# Patient Record
Sex: Male | Born: 1980
Health system: Southern US, Community
[De-identification: ages and names within clinical notes are randomized; demographics above are authoritative.]

## PROBLEM LIST (undated history)

## (undated) DIAGNOSIS — L659 Nonscarring hair loss, unspecified: Secondary | ICD-10-CM

## (undated) DIAGNOSIS — M549 Dorsalgia, unspecified: Secondary | ICD-10-CM

## (undated) DIAGNOSIS — R55 Syncope and collapse: Secondary | ICD-10-CM

## (undated) DIAGNOSIS — F1320 Sedative, hypnotic or anxiolytic dependence, uncomplicated: Secondary | ICD-10-CM

## (undated) DIAGNOSIS — R011 Cardiac murmur, unspecified: Secondary | ICD-10-CM

## (undated) DIAGNOSIS — F1120 Opioid dependence, uncomplicated: Secondary | ICD-10-CM

## (undated) DIAGNOSIS — G43909 Migraine, unspecified, not intractable, without status migrainosus: Secondary | ICD-10-CM

## (undated) DIAGNOSIS — G4700 Insomnia, unspecified: Secondary | ICD-10-CM

## (undated) DIAGNOSIS — R012 Other cardiac sounds: Secondary | ICD-10-CM

## (undated) DIAGNOSIS — F419 Anxiety disorder, unspecified: Secondary | ICD-10-CM

## (undated) DIAGNOSIS — F411 Generalized anxiety disorder: Secondary | ICD-10-CM

## (undated) DIAGNOSIS — R198 Other specified symptoms and signs involving the digestive system and abdomen: Secondary | ICD-10-CM

## (undated) DIAGNOSIS — R5383 Other fatigue: Secondary | ICD-10-CM

## (undated) HISTORY — PX: WISDOM TOOTH EXTRACTION: SHX21

## (undated) HISTORY — DX: Insomnia, unspecified: G47.00

## (undated) HISTORY — DX: Anxiety disorder, unspecified: F41.9

## (undated) HISTORY — DX: Generalized anxiety disorder: F41.1

## (undated) HISTORY — DX: Other specified symptoms and signs involving the digestive system and abdomen: R19.8

## (undated) HISTORY — DX: Other cardiac sounds: R01.2

## (undated) HISTORY — DX: Cardiac murmur, unspecified: R01.1

## (undated) HISTORY — DX: Nonscarring hair loss, unspecified: L65.9

## (undated) HISTORY — DX: Syncope and collapse: R55

## (undated) HISTORY — DX: Other fatigue: R53.83

---

## 2005-02-15 ENCOUNTER — Emergency Department (HOSPITAL_COMMUNITY): Admission: EM | Admit: 2005-02-15 | Discharge: 2005-02-15 | Payer: Self-pay | Admitting: Emergency Medicine
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
## 2011-06-17 ENCOUNTER — Other Ambulatory Visit: Payer: Self-pay

## 2011-06-17 NOTE — Telephone Encounter (Signed)
Pt is requesting dr Merla Riches to refill his three medication. Pt states cant get into the walkin clinic to see him Pharmacy is [facility]

## 2011-06-19 ENCOUNTER — Telehealth: Payer: Self-pay | Admitting: Family Medicine

## 2011-06-19 NOTE — Telephone Encounter (Signed)
.  umfc The patient called again regarding his medication refill.  There is message from 06-17-11 in system.  Please call patient at [phone]

## 2011-06-19 NOTE — Telephone Encounter (Signed)
Patient would like to know if we can refill his prescriptions without an office visit.  Patient is not sure when he can return to clinic.  He will try to come in next Tuesday.   Will need to pull chart.

## 2011-06-24 ENCOUNTER — Ambulatory Visit: Payer: 59 | Admitting: Internal Medicine

## 2011-06-24 VITALS — BP 131/85 | HR 82 | Temp 98.6°F | Resp 16 | Ht 68.0 in | Wt 172.0 lb

## 2011-06-24 DIAGNOSIS — G4700 Insomnia, unspecified: Secondary | ICD-10-CM | POA: Insufficient documentation

## 2011-06-24 DIAGNOSIS — G43909 Migraine, unspecified, not intractable, without status migrainosus: Secondary | ICD-10-CM | POA: Insufficient documentation

## 2011-06-24 DIAGNOSIS — F411 Generalized anxiety disorder: Secondary | ICD-10-CM

## 2011-06-24 MED ORDER — BUTALBITAL-ASA-CAFFEINE 50-325-40 MG PO CAPS: ORAL_CAPSULE | ORAL | Status: DC

## 2011-06-24 MED ORDER — CLONAZEPAM 0.5 MG PO TABS: 0.5000 mg | ORAL_TABLET | Freq: Three times a day (TID) | ORAL | Status: DC | PRN

## 2011-06-24 MED ORDER — TRAZODONE HCL 150 MG PO TABS: ORAL_TABLET | ORAL | Status: DC

## 2011-06-24 MED ORDER — BUTALBITAL-APAP-CAFFEINE 50-325-40 MG PO TABS: ORAL_TABLET | ORAL | Status: DC

## 2011-06-24 NOTE — Progress Notes (Signed)
  Subjective:    Patient ID: Dalton Kelly, male    DOB: February 23, 1981, 31 y.o.   MRN: [id number]  HPIReturns for routine followup Patient Active Problem List  Diagnoses  . Generalized anxiety disorder  . Persistent headaches  . Insomnia  He has been stable on medications for the last 6 months His job at [employer]. Has become more stressful since they were taken over by [employer] His relationship has dealt with more stress as his partner is working 12-15 hour days He wants to discuss adding children to their relationship he did through a surrogate birth or through adoption and we discussed those issues as well as methods for proving that he is fertile      Review of Systems  Constitutional: Negative for activity change and appetite change.       Notable for an 8 pound weight loss by increased activity and dieting  Eyes: Negative for visual disturbance.  Respiratory: Negative.   Cardiovascular: Negative.   Gastrointestinal: Negative.   Genitourinary: Negative.   Psychiatric/Behavioral: Negative for suicidal ideas, hallucinations, behavioral problems, confusion, sleep disturbance, self-injury, dysphoric mood, decreased concentration and agitation. The patient is not nervous/anxious and is not hyperactive.        There is some sleep disturbance because his partner  Has night terrors/for the last 2 weeks they have slept in separate rooms       Objective:   Physical ExamVital signs stable HEENT clear Neurologic: Intact Mood intact/appropriate        Assessment & Plan:   Patient Active Problem List  Diagnoses  . Generalized anxiety disorder  . Persistent headaches  . Insomnia   Meds ordered this encounter  Medications  . DISCONTD: clonazePAM (KLONOPIN) 0.5 MG tablet    Sig: Take 0.5 mg by mouth 3 (three) times daily as needed.  Marland Kitchen DISCONTD: butalbital-acetaminophen-caffeine (FIORICET WITH CODEINE) 50-325-40-30 MG per capsule    Sig: Take 1 capsule by mouth  every 4 (four) hours as needed.  Marland Kitchen DISCONTD: traZODone (DESYREL) 50 MG tablet    Sig: Take 75 mg by mouth at bedtime.  . clonazePAM (KLONOPIN) 0.5 MG tablet    Sig: Take 1 tablet (0.5 mg total) by mouth 3 (three) times daily as needed.    Dispense:  270 tablet    Refill:  1  . DISCONTD: butalbital-aspirin-caffeine (FIORINAL) 50-325-40 MG per capsule    Sig: 1-2 for HA tid prn    Dispense:  90 capsule    Refill:  5  . traZODone (DESYREL) 150 MG tablet    Sig: 1/2 at bedtime    Dispense:  45 tablet    Refill:  5  . butalbital-acetaminophen-caffeine (FIORICET, ESGIC) 50-325-40 MG per tablet    Sig: 1-2 up to 3 times a day for headache    Dispense:  90 tablet    Refill:  5   Referred to urology for infertility evaluation As surrogate parenting may cause $100,000 for each attempt Recheck 6 months

## 2011-07-28 ENCOUNTER — Telehealth: Payer: Self-pay

## 2011-07-28 NOTE — Telephone Encounter (Signed)
Pt states he is home sick, no transportation today, cannot come in to be evaluated.  Would like Dr. Merla Riches or someone to call in medication for nausea.  [facility].  [phone]

## 2011-07-29 ENCOUNTER — Ambulatory Visit (INDEPENDENT_AMBULATORY_CARE_PROVIDER_SITE_OTHER): Payer: 59 | Admitting: Internal Medicine

## 2011-07-29 VITALS — BP 125/89 | HR 77 | Temp 99.3°F | Resp 16 | Ht 68.0 in | Wt 177.0 lb

## 2011-07-29 DIAGNOSIS — R110 Nausea: Secondary | ICD-10-CM

## 2011-07-29 DIAGNOSIS — R5383 Other fatigue: Secondary | ICD-10-CM

## 2011-07-29 DIAGNOSIS — R509 Fever, unspecified: Secondary | ICD-10-CM

## 2011-07-29 DIAGNOSIS — M791 Myalgia, unspecified site: Secondary | ICD-10-CM

## 2011-07-29 DIAGNOSIS — R8281 Pyuria: Secondary | ICD-10-CM

## 2011-07-29 LAB — POCT CBC
MCH, POC: 31.9 pg — AB (ref 27–31.2)
MCHC: 33.7 g/dL (ref 31.8–35.4)
MCV: 94.7 fL (ref 80–97)
MID (cbc): 0.7 (ref 0–0.9)
POC LYMPH PERCENT: 22.4 %L (ref 10–50)
Platelet Count, POC: 231 10*3/uL (ref 142–424)
RDW, POC: 13 %
WBC: 8.6 10*3/uL (ref 4.6–10.2)

## 2011-07-29 LAB — POCT URINALYSIS DIPSTICK
Leukocytes, UA: NEGATIVE
Protein, UA: 30
Urobilinogen, UA: 0.2

## 2011-07-29 LAB — POCT UA - MICROSCOPIC ONLY
Casts, Ur, LPF, POC: NEGATIVE
Crystals, Ur, HPF, POC: NEGATIVE

## 2011-07-29 LAB — TSH: TSH: 0.682 u[IU]/mL (ref 0.350–4.500)

## 2011-07-29 MED ORDER — PROMETHAZINE HCL 25 MG PO TABS: 25.0000 mg | ORAL_TABLET | Freq: Three times a day (TID) | ORAL | Status: DC | PRN

## 2011-07-29 MED ORDER — ONDANSETRON 4 MG PO TBDP
8.0000 mg | ORAL_TABLET | Freq: Once | ORAL | Status: AC
  Administered 2011-07-29: 8 mg via ORAL

## 2011-07-29 MED ORDER — TRAMADOL HCL 50 MG PO TABS: 50.0000 mg | ORAL_TABLET | Freq: Four times a day (QID) | ORAL | Status: AC | PRN

## 2011-07-29 NOTE — Telephone Encounter (Signed)
Patient here today for evaluation.

## 2011-07-29 NOTE — Progress Notes (Addendum)
  Subjective:    Patient ID: Dalton Kelly, male    DOB: May 09, 1980, 31 y.o.   MRN: [id number]  HPI3 day history of significant myalgia, with fever and headache. Minimal cough that is nonproductive. No sore throat. No sinus congestion. No otalgia. Has been too nauseated to eat for 48 hours but has not vomited. Has mild abdominal discomfort in the epigastric area. No dysuria or frequency except for increased frequency from intake of fluids. Has question of hesitation starting stream for a few weeks.    Review of SystemsHis mother has suggested concerned about his recent weight gain and seeming fatigue because her family has lots of hypothyroidism     Objective:   Physical ExamIn obvious discomfort HEENT is clear without adenopathy Lungs clear Heart regular without murmur Abdomen soft with quiet bowel sounds/is mildly tender in the epigastric area without rebound Skin is clear Is very tender in the large muscles symmetrically    Results for orders placed in visit on 07/29/11  POCT CBC      Component Value Range   WBC 8.6  4.6 - 10.2 (K/uL)   Lymph, poc 1.9  0.6 - 3.4    POC LYMPH PERCENT 22.4  10 - 50 (%L)   MID (cbc) 0.7  0 - 0.9    POC MID % 8.6  0 - 12 (%M)   POC Granulocyte 5.9  2 - 6.9    Granulocyte percent 69.0  37 - 80 (%G)   RBC 4.95  4.69 - 6.13 (M/uL)   Hemoglobin 15.8  14.1 - 18.1 (g/dL)   HCT, POC 40.9  81.1 - 53.7 (%)   MCV 94.7  80 - 97 (fL)   MCH, POC 31.9 (*) 27 - 31.2 (pg)   MCHC 33.7  31.8 - 35.4 (g/dL)   RDW, POC 91.4     Platelet Count, POC 231  142 - 424 (K/uL)   MPV 8.0  0 - 99.8 (fL)  POCT UA - MICROSCOPIC ONLY      Component Value Range   WBC, Ur, HPF, POC 2-4     RBC, urine, microscopic 0-1     Bacteria, U Microscopic small     Mucus, UA 1+     Epithelial cells, urine per micros 1-3     Crystals, Ur, HPF, POC neg     Casts, Ur, LPF, POC neg     Yeast, UA neg    POCT URINALYSIS DIPSTICK      Component Value Range   Color, UA yellow     Clarity, UA clear     Glucose, UA neg     Bilirubin, UA neg     Ketones, UA neg     Spec Grav, UA 1.015     Blood, UA neg     pH, UA 5.5     Protein, UA 30     Urobilinogen, UA 0.2     Nitrite, UA neg     Leukocytes, UA Negative         Assessment & Plan:  Problem #1 myalgia fever and nausea secondary to viral syndrome Phenergan 25 mg every 6-8 hours when necessary Push by mouth fluids Since ibuprofen causes GI upset for him, he can use tramadol 1-2 at bedtime for muscle aching and headache  Problem #2 mild pyuria Culture  Problem #3 recent weight gain/family history of thyroid disorders Check TSH and free T4

## 2011-08-01 ENCOUNTER — Telehealth: Payer: Self-pay

## 2011-08-01 ENCOUNTER — Encounter: Payer: Self-pay | Admitting: Internal Medicine

## 2011-08-01 NOTE — Telephone Encounter (Signed)
Pt is asking for tramflu [facility] [address]

## 2011-08-02 NOTE — Telephone Encounter (Signed)
Spoke with patient, he was requesting tamiflu to help with his illness.  Advised that Tamiflu would not help his symptoms this far into his illness.  Recommended symptomatic treatment with OTC meds, rest and hydration.  RTC if feeling worse/dehydrated.

## 2011-08-02 NOTE — Telephone Encounter (Signed)
LMOM to CB and clarify what med he is needing.Marland KitchenMarland Kitchen

## 2011-08-02 NOTE — Telephone Encounter (Signed)
Noted and agree. 

## 2011-11-01 ENCOUNTER — Telehealth: Payer: Self-pay

## 2011-11-01 DIAGNOSIS — R55 Syncope and collapse: Secondary | ICD-10-CM

## 2011-11-01 NOTE — Telephone Encounter (Signed)
Patient states that he passed out and has spoken with Dr Merla Riches about this before.  He would like to know if we could do a referral to a cardiologist to make sure everything is ok.   Best phone: [phone]

## 2011-11-01 NOTE — Telephone Encounter (Signed)
Referral placed.

## 2011-11-02 ENCOUNTER — Telehealth: Payer: Self-pay | Admitting: *Deleted

## 2011-11-02 DIAGNOSIS — R5383 Other fatigue: Secondary | ICD-10-CM

## 2011-11-02 NOTE — Telephone Encounter (Signed)
Pt notified that referral has been placed and that he will be contacted.

## 2011-11-02 NOTE — Telephone Encounter (Signed)
Please advise patient that referral authorized

## 2011-11-02 NOTE — Telephone Encounter (Signed)
Wants referral also to endocrinology because of low normal tsh and his current symptoms

## 2011-11-03 NOTE — Telephone Encounter (Signed)
Done, left message to advise.

## 2011-11-09 ENCOUNTER — Ambulatory Visit (INDEPENDENT_AMBULATORY_CARE_PROVIDER_SITE_OTHER): Payer: 59 | Admitting: Internal Medicine

## 2011-11-09 VITALS — BP 120/80 | HR 83 | Temp 98.7°F | Resp 18 | Ht 67.5 in | Wt 179.0 lb

## 2011-11-09 DIAGNOSIS — R5383 Other fatigue: Secondary | ICD-10-CM

## 2011-11-09 DIAGNOSIS — R55 Syncope and collapse: Secondary | ICD-10-CM

## 2011-11-09 DIAGNOSIS — R011 Cardiac murmur, unspecified: Secondary | ICD-10-CM

## 2011-11-09 DIAGNOSIS — R5381 Other malaise: Secondary | ICD-10-CM

## 2011-11-09 DIAGNOSIS — L659 Nonscarring hair loss, unspecified: Secondary | ICD-10-CM

## 2011-11-09 DIAGNOSIS — R002 Palpitations: Secondary | ICD-10-CM

## 2011-11-09 DIAGNOSIS — R635 Abnormal weight gain: Secondary | ICD-10-CM

## 2011-11-09 LAB — COMPREHENSIVE METABOLIC PANEL
AST: 14 U/L (ref 0–37)
Albumin: 4.8 g/dL (ref 3.5–5.2)
BUN: 10 mg/dL (ref 6–23)
Calcium: 9.6 mg/dL (ref 8.4–10.5)
Chloride: 104 mEq/L (ref 96–112)
Potassium: 4.7 mEq/L (ref 3.5–5.3)
Sodium: 139 mEq/L (ref 135–145)
Total Protein: 7 g/dL (ref 6.0–8.3)

## 2011-11-09 LAB — POCT CBC
HCT, POC: 46.2 % (ref 43.5–53.7)
MCHC: 31.8 g/dL (ref 31.8–35.4)
MCV: 98.6 fL — AB (ref 80–97)
MID (cbc): 0.5 (ref 0–0.9)
POC LYMPH PERCENT: 36.7 %L (ref 10–50)
Platelet Count, POC: 259 10*3/uL (ref 142–424)
RDW, POC: 12.8 %
WBC: 6.1 10*3/uL (ref 4.6–10.2)

## 2011-11-09 LAB — TSH: TSH: 1.967 u[IU]/mL (ref 0.350–4.500)

## 2011-11-09 NOTE — Progress Notes (Addendum)
Subjective:    Patient ID: Dalton Kelly, male    DOB: 24-Dec-1980, 31 y.o.   MRN: [id number]  HPI  Pt here concerned about his thyroid. TSH Was 0.685 in June and is worried because it's on the low end of normal. Has been very fatigued. His job is very stressful and is getting worse. Has been exercising and really trying to lose weight and is slowly gaining wt instead.  Feels like he is losing a lot of hair. Has been sick a lot this yr which is extremely unusual. Hasn't missed work for illnesses before this yr. Having more BM's. Now daily where as it used to be every 2-3 days. Has difficulty concentrating. Failing fuzzyheaded. Continues with trazodone for sleep and this works well, getting 8 hours sleep at night without snoring or observed apnea and without daytime hypersomnolence. Continues with Klonopin and helps control anxiety on a when necessary basis and this is helpful.Exercise habits good.  Was getting his haircut last sat and was sitting still and he fainted. Was out 2-3 min. Was not dehydrated. Drinks plenty of water. He called here and we placed a referral to endo (see phone messages), but endo wants a full thyroid panel and other BW.  Continues with markedly increased stress level secondary to work [employer]-Will be fully certified after completing hours through March of 2014.  Current outpatient prescriptions:butalbital-acetaminophen-caffeine (FIORICET, ESGIC) 50-325-40 MG per tablet, 1-2 up to 3 times a day for headache, Disp: 90 tablet, Rfl: 5;  clonazePAM (KLONOPIN) 0.5 MG tablet, Take 1 tablet (0.5 mg total) by mouth 3 (three) times daily as needed., Disp: 270 tablet, Rfl: 1;  traZODone (DESYREL) 150 MG tablet, 1/2 at bedtime, Disp: 45 tablet, Rfl: 5  Long-term monogamousSSP-no risk factors  Review of Systems Continues with frequent headaches which have been present since early teen years. He has had a number of evaluations numerous medications including attempts with migraine  prevention, but nothing has ever worked except for the use of his current medication Fioricet when he has headaches. These are at least once a week. No vision changes/none congestion/no TMJ Denies shortness of breath or dyspnea on exertion, chest pain, edema, palpitations Denies reflux, nausea, vomiting/bowel movements are regular and very from every few days 2 daily/no definite history of dietary intolerances No arthritis/no muscle problems Negative genito-urinary complaints Denies depression symptoms    Objective:   Physical Exam Vital signs stable No acute distress Pupils equal round react to light and accommodation/EOMs conjugate ENT clear No thyromegaly or lymphadenopathy Heart regular without murmur /Systolic click present that moves with respiration/no associated murmur Lungs clear Extremities no edema/4 peripheral pulsesIntact Neurological intact   Results for orders placed in visit on 11/09/11  POCT CBC      Component Value Range   WBC 6.1  4.6 - 10.2 K/uL   Lymph, poc 2.2  0.6 - 3.4   POC LYMPH PERCENT 36.7  10 - 50 %L   MID (cbc) 0.5  0 - 0.9   POC MID % 7.4  0 - 12 %M   POC Granulocyte 3.4  2 - 6.9   Granulocyte percent 55.9  37 - 80 %G   RBC 4.69  4.69 - 6.13 M/uL   Hemoglobin 14.7  14.1 - 18.1 g/dL   HCT, POC 40.9  81.1 - 53.7 %   MCV 98.6 (*) 80 - 97 fL   MCH, POC 31.3 (*) 27 - 31.2 pg   MCHC 31.8  31.8 - 35.4 g/dL  RDW, POC 12.8     Platelet Count, POC 259  142 - 424 K/uL   MPV 8.1  0 - 99.8 fL  POCT SEDIMENTATION RATE      Component Value Range   POCT SED RATE 2  0 - 22 mm/hr        Assessment & Plan:  Problem #1 fatigue Problem #2 hair loss Problem #3 abnormal weight gain Problem #4 generalized anxiety Problem #5 insomnia Problem #6 headache syndrome Problem #7 systolic click Problem #8 irregular bowel habits Problem #9 recent syncope episode that sounds vasovagal  Check labs for metabolic parameters to call with results=Including thyroid  adrenal and celiac Will need to consider some intervention because his symptoms are so marked/He is greatly distressed with his fatigue level and his increased sense of not being well He has overheard our staff remarked that everything is normal and he is upset with him talking about him  May call for refill of Fioricet Clonopin and trazodone if needed over the next 3-6 months  This for visit represented prolonged care at 45 minutes not including chart and discussion of CBC or review of current labs

## 2011-11-11 ENCOUNTER — Telehealth: Payer: Self-pay

## 2011-11-11 NOTE — Telephone Encounter (Signed)
PATIENT WOULD LIKE TO KNOW IF HIS LAB RESULTS ARE BACK.  [phone]

## 2011-11-12 ENCOUNTER — Encounter: Payer: Self-pay | Admitting: Internal Medicine

## 2011-11-12 LAB — DHEA

## 2011-11-13 ENCOUNTER — Telehealth: Payer: Self-pay

## 2011-11-13 NOTE — Telephone Encounter (Signed)
See notes under previous phone message. 

## 2011-11-13 NOTE — Telephone Encounter (Signed)
Called pt and LMOM that labs have been reviewed by Dr Merla Riches and he sent him a letter explaining results yesterday. Advised pt that if he would like, he can CB and we will be happy to go over the results w/him over the phone.

## 2011-11-13 NOTE — Telephone Encounter (Signed)
Pt CB and d/w him the letter/labs that Dr Merla Riches sent. Pt is in agreement w/cardiologist referral and stated that it has already been set up and he just needs to call them to reschedule for a time he can go. Also, he stated that the endocrinologist needs the results of the labs bf an appt can be made, but they need to show that thyroid levels are abnormal in order to see pt, which the results do not support.

## 2011-11-13 NOTE — Telephone Encounter (Signed)
Pt returning [place] phone, pt states he will be available between now and 12pm for a return call. [phone]

## 2011-11-28 ENCOUNTER — Institutional Professional Consult (permissible substitution): Payer: Self-pay | Admitting: Cardiology

## 2011-12-11 ENCOUNTER — Telehealth: Payer: Self-pay

## 2011-12-11 NOTE — Telephone Encounter (Signed)
°  Pt is requesting six month supply of clonazePAM (KLONOPIN) 0.5 MG tablet butalbital-acetaminophen-caffeine (FIORICET, ESGIC) 50-325-40 MG per tablet [facility]   CBN:  [phone]

## 2011-12-12 ENCOUNTER — Telehealth: Payer: Self-pay

## 2011-12-12 MED ORDER — CLONAZEPAM 0.5 MG PO TABS: 0.5000 mg | ORAL_TABLET | Freq: Three times a day (TID) | ORAL | Status: DC | PRN

## 2011-12-12 NOTE — Telephone Encounter (Signed)
Pt states that the rx clonazepam  that was faxed should have been sent over for 6 month supply, also pt states that he needed a refill on floricet. [phone]

## 2011-12-12 NOTE — Telephone Encounter (Signed)
See notes under 10/17 phone message.

## 2011-12-12 NOTE — Telephone Encounter (Signed)
Pt CB asking why his clonazepam Rx has not been sent in yet. April explained to pt that it has to be approved by Dr Merla Riches. Can someone else review this RF req since Dr Merla Riches will not be back in office until Monday? 11/09/11 OV notes state we may RF clonazepam for 3-6 mos.

## 2011-12-12 NOTE — Telephone Encounter (Signed)
Spoke w/pt and explained that we have written a one month supply of clonazepam and will fax it to the pharmacy since Dr Merla Riches will not be in office until Monday. Clonazepam RF faxed to pharmacy w/confirmation.   Dr Merla Riches can you specify how many more RFs of clonazepam you approve for pt and also RF his Fioricet for the approved number of RFs (we have not sent in any Fioricet for pt yet).

## 2011-12-12 NOTE — Telephone Encounter (Signed)
Rx ready to be faxed.

## 2011-12-13 MED ORDER — CLONAZEPAM 0.5 MG PO TABS: 0.5000 mg | ORAL_TABLET | Freq: Three times a day (TID) | ORAL | Status: DC | PRN

## 2011-12-13 MED ORDER — BUTALBITAL-APAP-CAFFEINE 50-325-40 MG PO TABS: ORAL_TABLET | ORAL | Status: DC

## 2011-12-13 NOTE — Telephone Encounter (Signed)
See notes at end of last ov-ok to refill 3-6 mos Ok to fax or call printed rx Meds ordered this encounter  Medications  . DISCONTD: clonazePAM (KLONOPIN) 0.5 MG tablet    Sig: Take 1 tablet (0.5 mg total) by mouth 3 (three) times daily as needed.    Dispense:  90 tablet    Refill:  0  . clonazePAM (KLONOPIN) 0.5 MG tablet    Sig: Take 1 tablet (0.5 mg total) by mouth 3 (three) times daily as needed.    Dispense:  90 tablet    Refill:  5  . butalbital-acetaminophen-caffeine (FIORICET, ESGIC) 50-325-40 MG per tablet    Sig: 1-2 up to 3 times a day for headache    Dispense:  90 tablet    Refill:  5   Ok to do same when trazadone refill needed

## 2011-12-14 NOTE — Telephone Encounter (Signed)
Review of the chart shows that these were sent in on 12/13/11. Please review medications prior to sending to [facility] pool.

## 2011-12-14 NOTE — Telephone Encounter (Signed)
Spoke to patient and let him know the meds were called in.

## 2012-01-15 ENCOUNTER — Institutional Professional Consult (permissible substitution): Payer: Self-pay | Admitting: Cardiology

## 2012-04-06 ENCOUNTER — Telehealth: Payer: Self-pay

## 2012-04-06 NOTE — Telephone Encounter (Signed)
PT STATES HE HAVE A BACTERIAL VIRUS AND WOULD LIKE TO SPEAK WITH DR [name] ABOUT IT. PLEASE CALL [phone] HE DIDN'T WANT TO COME IN

## 2012-04-06 NOTE — Telephone Encounter (Signed)
I have advised him he can try Mucinex until he comes in.

## 2012-04-06 NOTE — Telephone Encounter (Signed)
I called patient he states he has had illness for 2 weeks. He has congestion, not sleeping and has cough. I have advised him to come in to the office.

## 2012-06-16 ENCOUNTER — Encounter: Payer: Self-pay | Admitting: Internal Medicine

## 2012-06-16 ENCOUNTER — Telehealth: Payer: Self-pay

## 2012-06-16 ENCOUNTER — Ambulatory Visit (INDEPENDENT_AMBULATORY_CARE_PROVIDER_SITE_OTHER): Payer: 59 | Admitting: Internal Medicine

## 2012-06-16 VITALS — BP 124/84 | HR 75 | Temp 98.1°F | Resp 18 | Ht 68.0 in | Wt 170.0 lb

## 2012-06-16 DIAGNOSIS — G4700 Insomnia, unspecified: Secondary | ICD-10-CM

## 2012-06-16 DIAGNOSIS — F411 Generalized anxiety disorder: Secondary | ICD-10-CM

## 2012-06-16 DIAGNOSIS — R51 Headache: Secondary | ICD-10-CM

## 2012-06-16 MED ORDER — TRAZODONE HCL 150 MG PO TABS: ORAL_TABLET | ORAL | Status: DC

## 2012-06-16 MED ORDER — ALPRAZOLAM 0.5 MG PO TABS: 0.5000 mg | ORAL_TABLET | Freq: Three times a day (TID) | ORAL | Status: DC | PRN

## 2012-06-16 MED ORDER — BUTALBITAL-APAP-CAFFEINE 50-325-40 MG PO TABS: ORAL_TABLET | ORAL | Status: DC

## 2012-06-16 NOTE — Telephone Encounter (Signed)
Patient is concerned about being taken off of klonopin without titrating down, please advise

## 2012-06-16 NOTE — Progress Notes (Signed)
  Subjective:    Patient ID: Dalton Kelly, male    DOB: May 12, 1980, 32 y.o.   MRN: [id number]  HPIf/u Patient Active Problem List  Diagnosis  . Generalized anxiety disorder--klonop .5 tid no longer enough on some days//anx work related/busyness of life  . Persistent headaches--decr freq and intensity and Esgic works well!!!  . Insomnia -1/2 x 150 traz works [employer] more=dry mouth and am sedation  . Systolic click     Evening anxiety and morning anx both bad No subs misuse/no cigs Stable relat  Review of Systems  Constitutional: Negative for activity change, fatigue and unexpected weight change.  Eyes: Negative for visual disturbance.  Cardiovascular: Negative for chest pain and palpitations.       Objective:   Physical Exam BP 124/84  Pulse 75  Temp(Src) 98.1 F (36.7 C)  Resp 18  Ht 5\' 8"  (1.727 m)  Wt 170 lb (77.111 kg)  BMI 25.85 kg/m2 heent cl Ht reg/pos click Neuro int Dry skin changes on lower extr Mood good /aff sta    Assessment & Plan:  Generalized anxiety disorder Insomnia  Persistent headaches Dyshidrosis legs  Meds ordered this encounter  Medications  . ALPRAZolam (XANAX) 0.5 MG tablet    Sig: Take 1 tablet (0.5 mg total) by mouth 3 (three) times daily as needed for sleep or anxiety.    Dispense:  90 tablet    Refill:  5  . traZODone (DESYREL) 150 MG tablet    Sig: 1/2 at bedtime    Dispense:  45 tablet    Refill:  3  . 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

## 2012-06-16 NOTE — Telephone Encounter (Signed)
Pt states that he saw Dr.[name] today and was completely taken off of Klonopin because of it no longer being effective, pt is now worried after reading online that he could possibly suffer from withdrawal from the klonopin since he had been taking the rx for 3 1/2 years. Please advise. Best# [phone]

## 2012-06-17 NOTE — Telephone Encounter (Signed)
Call/direct switch to xanax will prevent this

## 2012-06-17 NOTE — Telephone Encounter (Signed)
Called him to advise.  

## 2012-06-20 ENCOUNTER — Telehealth: Payer: Self-pay

## 2012-06-20 DIAGNOSIS — F411 Generalized anxiety disorder: Secondary | ICD-10-CM

## 2012-06-20 MED ORDER — CLONAZEPAM 0.5 MG PO TABS: ORAL_TABLET | ORAL | Status: DC

## 2012-06-20 NOTE — Telephone Encounter (Signed)
Would you like to change? Please advise 

## 2012-06-20 NOTE — Telephone Encounter (Signed)
Patient would like Dr Merla Riches to call him back.

## 2012-06-20 NOTE — Telephone Encounter (Signed)
Patient was in Wednesday and the Klonopin is not working as well as it has in the past so was switched to xanax and that is worse. Anxiety is up from 6.5 to a 9 and he is not sleeping. Would like to switch back to Klonopin. Best [phone]

## 2012-06-20 NOTE — Telephone Encounter (Signed)
Ok for  Meds ordered this encounter  Medications  . clonazePAM (KLONOPIN) 0.5 MG tablet    Sig: 1 am, 1 midday and 2 hs    Dispense:  120 tablet    Refill:  5

## 2012-06-21 NOTE — Telephone Encounter (Signed)
Klonopin Rx called into [facility]

## 2012-11-30 ENCOUNTER — Ambulatory Visit: Payer: 59 | Admitting: Internal Medicine

## 2012-11-30 VITALS — BP 122/80 | HR 77 | Temp 98.5°F | Resp 16 | Ht 67.5 in | Wt 177.6 lb

## 2012-11-30 DIAGNOSIS — R51 Headache: Secondary | ICD-10-CM

## 2012-11-30 DIAGNOSIS — F411 Generalized anxiety disorder: Secondary | ICD-10-CM

## 2012-11-30 DIAGNOSIS — G4700 Insomnia, unspecified: Secondary | ICD-10-CM

## 2012-11-30 MED ORDER — BUTALBITAL-APAP-CAFFEINE 50-325-40 MG PO TABS: ORAL_TABLET | ORAL | Status: DC

## 2012-11-30 MED ORDER — CLONAZEPAM 0.5 MG PO TABS: ORAL_TABLET | ORAL | Status: DC

## 2012-11-30 NOTE — Progress Notes (Signed)
  Subjective:    Patient ID: Dalton Kelly, male    DOB: [birth year], 32 y.o.   MRN: [id number]  HPI Has an appointment tomorrow, but had to cancel because he is returning back to work tomorrow. FMLA ends today.  Is in today to have klonopin and fioricet refilled.  No change in headaches. Fioricet works well if he takes 2 tabs, occasionally has to repeat in 4 hours.  Has noticed red, itchy bumps on distal knuckles for 1 month. Thinks this is related to new shaving gel that he started using one month ago. No bumps anywhere else.  Flu- declined.  Review of Systems     Objective:   Physical Exam  Nursing note and vitals reviewed. Constitutional: He is oriented to person, place, and time. He appears well-developed and well-nourished.  Neurological: He is alert and oriented to person, place, and time.  Skin: Skin is warm and dry.  Psychiatric: He has a normal mood and affect.   Bilateral dorsal DIPs with small amount swelling but no change in skin surface     Assessment & Plan:  GAD (generalized anxiety disorder) - Plan: clonazePAM (KLONOPIN) 0.5 MG tablet  Persistent headaches  Insomnia  Meds ordered this encounter  Medications  . clonazePAM (KLONOPIN) 0.5 MG tablet    Sig: 1 am, 1 midday and 2 hs    Dispense:  120 tablet    Refill:  5  . butalbital-acetaminophen-caffeine (FIORICET, ESGIC) 50-325-40 MG per tablet    Sig: 1-2 up to 3 times a day for headache    Dispense:  90 tablet    Refill:  5   Hand itching ?stress

## 2012-12-01 ENCOUNTER — Ambulatory Visit: Payer: 59 | Admitting: Internal Medicine

## 2012-12-15 ENCOUNTER — Ambulatory Visit: Payer: 59 | Admitting: Internal Medicine

## 2013-05-29 ENCOUNTER — Ambulatory Visit (INDEPENDENT_AMBULATORY_CARE_PROVIDER_SITE_OTHER): Payer: 59 | Admitting: Internal Medicine

## 2013-05-29 VITALS — BP 120/68 | HR 87 | Temp 97.8°F | Resp 16 | Ht 68.0 in | Wt 172.0 lb

## 2013-05-29 DIAGNOSIS — R51 Headache: Secondary | ICD-10-CM

## 2013-05-29 DIAGNOSIS — R519 Headache, unspecified: Secondary | ICD-10-CM

## 2013-05-29 DIAGNOSIS — F411 Generalized anxiety disorder: Secondary | ICD-10-CM

## 2013-05-29 MED ORDER — BUTALBITAL-APAP-CAFFEINE 50-325-40 MG PO TABS: ORAL_TABLET | ORAL | Status: DC

## 2013-05-29 MED ORDER — CLONAZEPAM 0.5 MG PO TABS: ORAL_TABLET | ORAL | Status: DC

## 2013-05-29 NOTE — Progress Notes (Addendum)
   Subjective:    Patient ID: Dalton Kelly, male    DOB: [birth year], 33 y.o.   MRN: [id number]  HPI Chief Complaint  Patient presents with  . Medication Refill    fioricet,klonopin, desyrel   This chart was scribed for Ellamae Siaobert Rohnan Bartleson, MD by Andrew Auaven Small, ED Scribe. This patient was seen in room 8 and the patient's care was started at 1:39 PM.  HPI Comments: Dalton Kelly is a 33 y.o. male who presents to the [facility] for a medication refill regarding fioricet, klonopin, desyrel. Pt reports that he is doing well. Pt reports that everything has been stable. Pt reports that he has about half desyrel left since the last visit due to refills. Pt reports that he is sleeping well and that work has been going well also. Pt reports that he has been on crutches for 3 weeks due to a right foot injury. Pt reports that he is in a boot for 6 weeks.   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 Fatigue   . Hair loss   . Generalized anxiety disorder   . Insomnia   . Systolic click   . Irregular bowel habits   . Episode of syncope     Recent that sounds vasovagal  . Anxiety    No Known Allergies Prior to Admission medications   Medication Sig Start Date End Date Taking? Authorizing Provider  butalbital-acetaminophen-caffeine (FIORICET, ESGIC) [phone] MG per tablet 1-2 up to 3 times a day for headache 11/30/12  Yes Tonye Pearsonobert P Craig Wisnewski, MD  clonazePAM [facility](KLONOPIN) 0.5 MG tablet 1 am, 1 midday and 2 hs 11/30/12  Yes Tonye Pearsonobert P Bethlehem Langstaff, MD  traZODone (DESYREL) 150 MG tablet 1/2 at bedtime 06/16/12  Yes Tonye Pearsonobert P Mearl Olver, MD   Review of Systems  Psychiatric/Behavioral: Negative for sleep disturbance.      Objective:   Physical Exam  Nursing note and vitals reviewed. Constitutional: He is oriented to person, place, and time. He appears well-developed and well-nourished. No distress.  HENT:  Head: Normocephalic and atraumatic.    Eyes: EOM are normal.  Neck: Neck supple. No tracheal deviation present.  Cardiovascular: Normal rate.   Pulmonary/Chest: Effort normal. No respiratory distress.  Musculoskeletal: Normal range of motion.  Neurological: He is alert and oriented to person, place, and time.  Skin: Skin is warm and dry.  Psychiatric: He has a normal mood and affect. His behavior is normal.      Assessment & Plan:   I have completed the patient encounter in its entirety as documented by the scribe, with editing by me where necessary. Aaran Enberg P. Merla Richesoolittle, M.D.   1. GAD (generalized anxiety disorder)   2. HA (headache)    Meds ordered this encounter  Medications  . clonazePAM (KLONOPIN) 0.5 MG tablet    Sig: 1 am, 1 midday and 2 hs    Dispense:  120 tablet    Refill:  5  . butalbital-acetaminophen-caffeine (FIORICET, ESGIC) 50-325-40 MG per tablet    Sig: 1-2 up to 3 times a day for headache    Dispense:  90 tablet    Refill:  5    May use mychat to commun re refills---next ov by appt 104 1 year [facility]

## 2013-06-07 ENCOUNTER — Encounter: Payer: Self-pay | Admitting: Internal Medicine

## 2013-06-07 DIAGNOSIS — F411 Generalized anxiety disorder: Secondary | ICD-10-CM

## 2013-06-08 ENCOUNTER — Encounter: Payer: Self-pay | Admitting: Internal Medicine

## 2013-06-08 MED ORDER — PROMETHAZINE HCL 25 MG PO TABS: 25.0000 mg | ORAL_TABLET | Freq: Four times a day (QID) | ORAL | Status: DC | PRN

## 2013-07-07 ENCOUNTER — Encounter: Payer: Self-pay | Admitting: Internal Medicine

## 2013-07-07 DIAGNOSIS — G4700 Insomnia, unspecified: Secondary | ICD-10-CM

## 2013-07-08 MED ORDER — TRAZODONE HCL 150 MG PO TABS: ORAL_TABLET | ORAL | Status: DC

## 2013-09-20 MED ORDER — PROMETHAZINE HCL 25 MG PO TABS
25.0000 mg | ORAL_TABLET | Freq: Four times a day (QID) | ORAL | Status: DC | PRN
Start: 2013-09-20 — End: 2014-07-04

## 2013-09-20 NOTE — Telephone Encounter (Signed)
Done

## 2013-10-25 ENCOUNTER — Encounter: Payer: Self-pay | Admitting: Internal Medicine

## 2013-10-25 DIAGNOSIS — F411 Generalized anxiety disorder: Secondary | ICD-10-CM

## 2013-10-25 DIAGNOSIS — G44049 Chronic paroxysmal hemicrania, not intractable: Secondary | ICD-10-CM

## 2013-10-26 MED ORDER — BUTALBITAL-APAP-CAFFEINE 50-325-40 MG PO TABS: ORAL_TABLET | ORAL | Status: DC

## 2013-10-26 MED ORDER — CLONAZEPAM 0.5 MG PO TABS: ORAL_TABLET | ORAL | Status: DC

## 2013-10-26 NOTE — Telephone Encounter (Signed)
Meds ordered this encounter  Medications  . butalbital-acetaminophen-caffeine (FIORICET, ESGIC) 50-325-40 MG per tablet    Sig: 1-2 up to 3 times a day for headache    Dispense:  90 tablet    Refill:  5  . clonazePAM (KLONOPIN) 0.5 MG tablet    Sig: 1 am, 1 midday and 2 hs    Dispense:  120 tablet    Refill:  5

## 2013-10-26 NOTE — Telephone Encounter (Signed)
Faxed Rx

## 2014-04-19 ENCOUNTER — Encounter: Payer: Self-pay | Admitting: Internal Medicine

## 2014-05-03 ENCOUNTER — Ambulatory Visit (INDEPENDENT_AMBULATORY_CARE_PROVIDER_SITE_OTHER): Payer: 59 | Admitting: Internal Medicine

## 2014-05-03 ENCOUNTER — Encounter: Payer: Self-pay | Admitting: Internal Medicine

## 2014-05-03 VITALS — BP 112/80 | HR 91 | Temp 97.8°F | Resp 16 | Ht 67.5 in | Wt 170.4 lb

## 2014-05-03 DIAGNOSIS — R51 Headache: Secondary | ICD-10-CM

## 2014-05-03 DIAGNOSIS — G4700 Insomnia, unspecified: Secondary | ICD-10-CM

## 2014-05-03 DIAGNOSIS — R519 Headache, unspecified: Secondary | ICD-10-CM

## 2014-05-03 DIAGNOSIS — F411 Generalized anxiety disorder: Secondary | ICD-10-CM | POA: Diagnosis not present

## 2014-05-03 MED ORDER — BUTALBITAL-APAP-CAFFEINE 50-325-40 MG PO TABS: ORAL_TABLET | ORAL | Status: DC

## 2014-05-03 MED ORDER — CLONAZEPAM 0.5 MG PO TABS: ORAL_TABLET | ORAL | Status: DC

## 2014-05-03 NOTE — Progress Notes (Signed)
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 No new neurological symptoms with headaches  Insomnia --Still does well with medication when he can't sleep and this is not getting in the way of his life   Health maintenance issues up-to-date  Review of systems--routine points negative except for present illness  Exam BP 112/80 mmHg  Pulse 91  Temp(Src) 97.8 F (36.6 C) (Oral)  Resp 16  Ht 5' 7.5" (1.715 m)  Wt 170 lb 6.4 oz (77.293 kg)  BMI 26.28 kg/m2  SpO2 100% HEENT clear Heart regular Cranial nerves intact Mood good Affect appropriate Judgment sound   Impression:  Generalized anxiety disorder  Persistent headaches  Insomnia  Meds ordered this encounter  Medications  . clonazePAM (KLONOPIN) 0.5 MG tablet    Sig: 1 am, 1 midday and 2 hs    Dispense:  120 tablet    Refill:  5  . butalbital-acetaminophen-caffeine (FIORICET, ESGIC) 50-325-40 MG per tablet    Sig: 1-2 up to 3 times a day for headache    Dispense:  90 tablet    Refill:  5   Follow-up by my chart

## 2014-06-07 ENCOUNTER — Telehealth: Payer: Self-pay

## 2014-06-07 ENCOUNTER — Encounter: Payer: Self-pay | Admitting: Internal Medicine

## 2014-06-07 NOTE — Telephone Encounter (Signed)
fyi Dr Merla Richesoolittle

## 2014-06-07 NOTE — Telephone Encounter (Signed)
Pt states he sent 2 messages in [employer] to Dr.Dolittle and want to make sure he reads them both before he responds to them both Please call [phone]

## 2014-06-08 ENCOUNTER — Other Ambulatory Visit: Payer: Self-pay | Admitting: Internal Medicine

## 2014-06-08 MED ORDER — BUTALBITAL-ASA-CAFF-CODEINE 50-325-40-30 MG PO CAPS: 1.0000 | ORAL_CAPSULE | Freq: Three times a day (TID) | ORAL | Status: DC | PRN

## 2014-06-08 NOTE — Telephone Encounter (Signed)
[facility] called-they have checked a # of other pharmacies and no one has the fiorinal with codeine, but they have the fioricet with codeine. Please advise if it's ok if they substitute.

## 2014-06-08 NOTE — Telephone Encounter (Signed)
Disregard message, dr Merla Richesdoolittle. Lanier Clamicole Bush helped me out with this. Thanks! The pharmacy didn't have the fiorinal with aspirin, they only had it with tylenol. So Joni Reiningicole approved it to be filled with the tylenol. Thanks!

## 2014-06-10 ENCOUNTER — Telehealth: Payer: Self-pay | Admitting: Internal Medicine

## 2014-06-10 MED ORDER — TOPIRAMATE 25 MG PO TABS: 25.0000 mg | ORAL_TABLET | Freq: Two times a day (BID) | ORAL | Status: DC

## 2014-06-10 NOTE — Telephone Encounter (Signed)
Added Topamax to decr freq of HAs

## 2014-06-10 NOTE — Telephone Encounter (Signed)
Patient is calling to check on the medication Topamax. Patient is requesting someone from clinical to give him a call about the medication. Patient stated he discussed with Dr. Merla Richesoolittle about trying Topamax. Please call patient at [phone]

## 2014-06-12 ENCOUNTER — Telehealth: Payer: Self-pay

## 2014-06-12 MED ORDER — TOPIRAMATE 25 MG PO TABS: 25.0000 mg | ORAL_TABLET | Freq: Every day | ORAL | Status: DC

## 2014-06-12 NOTE — Telephone Encounter (Signed)
Set in corrected Rx.

## 2014-06-12 NOTE — Telephone Encounter (Signed)
i only want qhs so #30

## 2014-06-12 NOTE — Telephone Encounter (Signed)
Pharm called to request a 30 day supply of Topamax, which would be #60 instead of #30. Dr Merla Richesoolittle since this is a new script, I wanted to make sure the quantity if what needs to be changed and not the directions. Pended for #60 to match sig.

## 2014-06-28 ENCOUNTER — Encounter: Payer: Self-pay | Admitting: Internal Medicine

## 2014-06-28 DIAGNOSIS — G4700 Insomnia, unspecified: Secondary | ICD-10-CM

## 2014-06-29 MED ORDER — TRAZODONE HCL 150 MG PO TABS: ORAL_TABLET | ORAL | Status: DC

## 2014-06-29 NOTE — Telephone Encounter (Signed)
Meds ordered this encounter  Medications  . traZODone (DESYREL) 150 MG tablet    Sig: 1/2 at bedtime    Dispense:  45 tablet    Refill:  3

## 2014-07-04 ENCOUNTER — Ambulatory Visit (INDEPENDENT_AMBULATORY_CARE_PROVIDER_SITE_OTHER): Payer: 59 | Admitting: Internal Medicine

## 2014-07-04 VITALS — BP 126/68 | HR 73 | Temp 98.7°F | Resp 16 | Ht 67.5 in | Wt 170.5 lb

## 2014-07-04 DIAGNOSIS — R51 Headache: Secondary | ICD-10-CM

## 2014-07-04 DIAGNOSIS — R519 Headache, unspecified: Secondary | ICD-10-CM

## 2014-07-04 MED ORDER — BUTALBITAL-APAP-CAFF-COD 50-325-40-30 MG PO CAPS: 1.0000 | ORAL_CAPSULE | ORAL | Status: DC | PRN

## 2014-07-04 MED ORDER — NADOLOL 20 MG PO TABS: 20.0000 mg | ORAL_TABLET | Freq: Every day | ORAL | Status: DC

## 2014-07-04 MED ORDER — BUTALBITAL-APAP-CAFF-COD 50-300-40-30 MG PO CAPS: 1.0000 | ORAL_CAPSULE | Freq: Three times a day (TID) | ORAL | Status: DC | PRN

## 2014-07-04 NOTE — Progress Notes (Signed)
   Subjective:    Patient ID: Dalton Kelly, male    DOB: Nov 06, 1980, 34 y.o.   MRN: [id number] This chart was scribed for Ellamae Siaobert Karinna Beadles, MD by Jolene Provostobert Halas, Medical Scribe. This patient was seen in Room 3 and the patient's care was started a 7:27 PM.  Chief Complaint  Patient presents with  . Follow-up    headache    HPI HPI Comments: Dalton Kelly is a 34 y.o. male [facility] for medications for his HA. Pt states he cannot take topomax because it makes his hair fall out He has done well since he started Fioricet with codeine. His mother has taken Fiorecet with Codeine for similar HA sx for many years, and it has worked well for her. She has also been treated with corgard which reduces her HA frequency, and he would like to take that as well.  No changes in his other problems and medications  Review of Systems  Constitutional: Negative for fever and chills.  Neurological: Positive for headaches.       Objective:   Physical Exam  Constitutional: He is oriented to person, place, and time. He appears well-developed and well-nourished. No distress.  HENT:  Head: Normocephalic and atraumatic.  Eyes: Pupils are equal, round, and reactive to light.  Neck: Neck supple.  Cardiovascular: Normal rate.   Pulmonary/Chest: Effort normal. No respiratory distress.  Musculoskeletal: Normal range of motion.  Neurological: He is alert and oriented to person, place, and time. Coordination normal.  Skin: Skin is warm and dry. He is not diaphoretic.  Psychiatric: He has a normal mood and affect. His behavior is normal.  Nursing note and vitals reviewed. BP 126/68 mmHg  Pulse 73  Temp(Src) 98.7 F (37.1 C) (Oral)  Resp 16  Ht 5' 7.5" (1.715 m)  Wt 170 lb 8 oz (77.338 kg)  BMI 26.29 kg/m2  SpO2 98%      Assessment & Plan:  Persistent headaches  Meds ordered this encounter  Medications  . nadolol (CORGARD) 20 MG tablet    Sig:  Take 1 tablet (20 mg total) by mouth daily.    Dispense:  30 tablet    Refill:  5  . butalbital-acetaminophen-caffeine (FIORICET WITH CODEINE) 50-325-40-30 MG per capsule    Sig: Take 1 capsule by mouth every 4 (four) hours as needed for headache.    Dispense:  90 capsule    Refill:  5   Follow up in the fall 2016  I have completed the patient encounter in its entirety as documented by the scribe, with editing by me where necessary. Zenya Hickam P. Merla Richesoolittle, M.D.

## 2014-07-05 ENCOUNTER — Telehealth: Payer: Self-pay

## 2014-07-05 NOTE — Telephone Encounter (Signed)
Answered pt's questions.

## 2014-07-05 NOTE — Telephone Encounter (Signed)
Patient is calling to speak to Benny LennertSarah Weber in regards to the tylenol medication. Patient states that last time it was sent it was a discontinued version of the medication and the patient is confused. Please call! [phone]

## 2014-08-25 ENCOUNTER — Other Ambulatory Visit: Payer: Self-pay

## 2014-08-25 ENCOUNTER — Encounter: Payer: Self-pay | Admitting: Internal Medicine

## 2014-08-25 MED ORDER — NADOLOL 20 MG PO TABS: 20.0000 mg | ORAL_TABLET | Freq: Every day | ORAL | Status: DC

## 2014-08-29 ENCOUNTER — Telehealth: Payer: Self-pay

## 2014-08-29 ENCOUNTER — Encounter: Payer: Self-pay | Admitting: Internal Medicine

## 2014-08-29 NOTE — Telephone Encounter (Signed)
[name] - Pt is taking nadolol for his headaches.  He has 4 refills left and his insurance will only fill 90-day supply.  However, his pharmacy called last week and left message that he had a 90-day with only refill.  He sent you a message on my chart about it and you told him that no one from here had sent it.  Pharmacy says your name was on it however.  Anyway, he says despite this weird happening, he does need his refill now for a 90-day supply.  [phone]

## 2014-08-30 ENCOUNTER — Other Ambulatory Visit: Payer: Self-pay | Admitting: Internal Medicine

## 2014-08-30 MED ORDER — NADOLOL 20 MG PO TABS: 20.0000 mg | ORAL_TABLET | Freq: Every day | ORAL | Status: DC

## 2014-08-30 NOTE — Telephone Encounter (Signed)
Rx sent 

## 2014-10-18 ENCOUNTER — Encounter: Payer: Self-pay | Admitting: Internal Medicine

## 2014-10-18 DIAGNOSIS — F411 Generalized anxiety disorder: Secondary | ICD-10-CM

## 2014-10-20 ENCOUNTER — Telehealth: Payer: Self-pay

## 2014-10-20 MED ORDER — CLONAZEPAM 0.5 MG PO TABS: ORAL_TABLET | ORAL | Status: DC

## 2014-10-20 NOTE — Telephone Encounter (Signed)
Patient is calling because he sent an e mail for a medication refill and hasn't received a response. Patient would 1 refill for clonopin to last until his next appointment. Pharmacy is [facility] [address]

## 2014-10-21 NOTE — Telephone Encounter (Signed)
Rx called into the pharm.

## 2014-11-21 ENCOUNTER — Telehealth: Payer: Self-pay

## 2014-11-24 ENCOUNTER — Telehealth: Payer: Self-pay | Admitting: Family Medicine

## 2014-11-24 NOTE — Telephone Encounter (Signed)
lmom of patient new appt date with Merla Riches with is 12/22/14 at 11:30

## 2014-12-18 NOTE — Telephone Encounter (Signed)
No msg °

## 2014-12-20 ENCOUNTER — Ambulatory Visit: Payer: 59 | Admitting: Internal Medicine

## 2014-12-22 ENCOUNTER — Encounter: Payer: Self-pay | Admitting: Internal Medicine

## 2014-12-22 ENCOUNTER — Ambulatory Visit (INDEPENDENT_AMBULATORY_CARE_PROVIDER_SITE_OTHER): Payer: 59 | Admitting: Internal Medicine

## 2014-12-22 VITALS — BP 109/72 | HR 66 | Temp 97.9°F | Resp 16 | Ht 68.0 in | Wt 160.0 lb

## 2014-12-22 DIAGNOSIS — R51 Headache: Secondary | ICD-10-CM | POA: Diagnosis not present

## 2014-12-22 DIAGNOSIS — R519 Headache, unspecified: Secondary | ICD-10-CM

## 2014-12-22 DIAGNOSIS — F411 Generalized anxiety disorder: Secondary | ICD-10-CM

## 2014-12-22 MED ORDER — BUTALBITAL-APAP-CAFF-COD 50-325-40-30 MG PO CAPS: 1.0000 | ORAL_CAPSULE | ORAL | Status: DC | PRN

## 2014-12-22 MED ORDER — CLONAZEPAM 0.5 MG PO TABS: ORAL_TABLET | ORAL | Status: DC

## 2014-12-24 NOTE — Progress Notes (Signed)
F/u Patient Active Problem List   Diagnosis Date Noted  . 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 Generalized anxiety disorder---stable on meds 06/24/2011  . Persistent headaches--best 6 months he's had in several yrs FINALLY on current regimen 06/24/2011  . Insomnia---not a factor now 06/24/2011   [place] Exam-BP 109/72 mmHg  Pulse 66  Temp(Src) 97.9 F (36.6 C)  Resp 16  Ht 5\' 8"  (1.727 m)  Wt 160 lb (72.576 kg)  BMI 24.33 kg/m2 Stable otherwise  IMP GAD (generalized anxiety disorder) - Plan: clonazePAM (KLONOPIN) 0.5 MG tablet  Persistent headaches  Meds ordered this encounter  Medications  . clonazePAM (KLONOPIN) 0.5 MG tablet    Sig: 1 am, 1 midday and 2 hs    Dispense:  120 tablet    Refill:  5  . butalbital-acetaminophen-caffeine (FIORICET WITH CODEINE) 50-325-40-30 MG capsule    Sig: Take 1 capsule by mouth every 4 (four) hours as needed for headache.    Dispense:  90 capsule    Refill:  5  has traz to cont 150 hs  F/u 6mos

## 2015-01-21 ENCOUNTER — Ambulatory Visit (INDEPENDENT_AMBULATORY_CARE_PROVIDER_SITE_OTHER): Payer: 59 | Admitting: Internal Medicine

## 2015-01-21 VITALS — BP 100/68 | HR 57 | Temp 98.4°F | Resp 16 | Ht 68.5 in | Wt 161.0 lb

## 2015-01-21 DIAGNOSIS — R05 Cough: Secondary | ICD-10-CM

## 2015-01-21 DIAGNOSIS — J0100 Acute maxillary sinusitis, unspecified: Secondary | ICD-10-CM

## 2015-01-21 DIAGNOSIS — R059 Cough, unspecified: Secondary | ICD-10-CM

## 2015-01-21 MED ORDER — HYDROCODONE-HOMATROPINE 5-1.5 MG/5ML PO SYRP: 5.0000 mL | ORAL_SOLUTION | Freq: Four times a day (QID) | ORAL | Status: DC | PRN

## 2015-01-21 MED ORDER — AMOXICILLIN 875 MG PO TABS: 875.0000 mg | ORAL_TABLET | Freq: Two times a day (BID) | ORAL | Status: DC

## 2015-01-21 NOTE — Progress Notes (Signed)
Subjective:  This chart was scribed for Ellamae Siaobert Bryson Palen, MD by Andrew Auaven Small, ED Scribe. This patient was seen in room 12 and the patient's care was started at 12:23 PM.    Patient ID: Dalton Kelly, male    DOB: 06/26/1980, 34 y.o.   MRN: [id number]  HPI   Chief Complaint  Patient presents with  . Cough    x 1 week  . Nasal Congestion  . Generalized Body Aches   HPI Comments: Dalton Kelly is a 34 y.o. male who presents to the [facility] complaining of cough. Symptoms started 1 week ago with sinus congestion and body aches. Cough keeps him awake at night, having to sit up while sleeping. He has been taking OTC medication without relief to symptoms. Pt has sick contacts at home.  He denies trouble swallowing. Pt cannot tolerate ibuprofen,   Past Medical History  Diagnosis Date  . Fatigue   . Hair loss   . Generalized anxiety disorder   . Insomnia   . Systolic click   . Irregular bowel habits   . Episode of syncope     Recent that sounds vasovagal  . Anxiety    Allergies  Allergen Reactions  . Ibuprofen     Abdominal pain   Prior to Admission medications   Medication Sig Start Date End Date Taking? Authorizing Provider  butalbital-acetaminophen-caffeine (FIORICET WITH CODEINE) 50-325-40-30 MG capsule Take 1 capsule by mouth every 4 (four) hours as needed for headache. 12/22/14  Yes Tonye Pearsonobert P Nakaiya Beddow, MD  clonazePAM (KLONOPIN) 0.5 MG tablet 1 am, 1 midday and 2 hs 12/22/14  Yes Tonye Pearsonobert P Kinsleigh Ludolph, MD  nadolol (CORGARD) 20 MG tablet Take 1 tablet (20 mg total) by mouth daily. 08/30/14  Yes Tonye Pearsonobert P Jewels Langone, MD  traZODone (DESYREL) 150 MG tablet 1/2 at bedtime 06/29/14  Yes Tonye Pearsonobert P Mckyla Deckman, MD    Review of Systems  Constitutional: Negative for fever and chills.  HENT: Positive for congestion.   Respiratory: Positive for cough.   Musculoskeletal: Positive for myalgias.    Objective:   Physical Exam  Constitutional: He is oriented to person,  place, and time. He appears well-developed and well-nourished. No distress.  HENT:  Head: Normocephalic and atraumatic.  Right Ear: External ear normal.  Left Ear: External ear normal.  Mouth/Throat: Oropharynx is clear and moist. No oropharyngeal exudate.  Purulent drainage in nose Throat clear  Eyes: Conjunctivae and EOM are normal.  Neck: Neck supple.  Cardiovascular: Normal rate, regular rhythm and normal heart sounds.   Pulmonary/Chest: Effort normal and breath sounds normal. He has no wheezes. He has no rales.  Musculoskeletal: Normal range of motion.  Lymphadenopathy:    He has no cervical adenopathy.  Neurological: He is alert and oriented to person, place, and time.  Skin: Skin is warm and dry.  Psychiatric: He has a normal mood and affect. His behavior is normal.  Nursing note and vitals reviewed.   Filed Vitals:   01/21/15 1221  BP: 100/68  Pulse: 57  Temp: 98.4 F (36.9 C)  Resp: 16  Height: 5' 8.5" (1.74 m)  Weight: 161 lb (73.029 kg)  SpO2: 96%   Assessment & Plan:  Max sinusitis w/ cough  Meds ordered this encounter  Medications  . amoxicillin (AMOXIL) 875 MG tablet    Sig: Take 1 tablet (875 mg total) by mouth 2 (two) times daily.    Dispense:  20 tablet    Refill:  0  .  HYDROcodone-homatropine (HYCODAN) 5-1.5 MG/5ML syrup    Sig: Take 5 mLs by mouth every 6 (six) hours as needed.    Dispense:  120 mL    Refill:  0    By signing my name below, I, Raven Small, attest that this documentation has been prepared under the direction and in the presence of Ellamae Sia, MD.  Electronically Signed: Andrew Au, ED Scribe. 01/21/2015. 12:32 PM.  I have completed the patient encounter in its entirety as documented by the scribe, with editing by me where necessary. Navin Dogan P. Merla Riches, M.D.

## 2015-01-22 ENCOUNTER — Telehealth: Payer: Self-pay

## 2015-01-22 ENCOUNTER — Encounter: Payer: Self-pay | Admitting: Internal Medicine

## 2015-01-22 ENCOUNTER — Other Ambulatory Visit: Payer: Self-pay | Admitting: Family Medicine

## 2015-01-22 MED ORDER — HYDROCOD POLST-CPM POLST ER 10-8 MG/5ML PO SUER: 5.0000 mL | Freq: Two times a day (BID) | ORAL | Status: DC | PRN

## 2015-01-22 NOTE — Telephone Encounter (Signed)
Patient came in office to p/u RX for Tussionex. Dr. Merla Richesoolittle approved and sent my chart message to patient, see below.  i'll send this to our refill pool and have someone sign for you as I'm not in til Tuesday pm         Dr Alwyn RenHopper will sign rx

## 2015-01-22 NOTE — Telephone Encounter (Signed)
Error

## 2015-01-22 NOTE — Telephone Encounter (Signed)
Please have someone sign this for him to pick up today Pended tussionex

## 2015-01-22 NOTE — Telephone Encounter (Signed)
Patient was recently prescribed a cough syrup that isn't hasn't been working and wants to know if he can get a prescription for tussuinex [phone]

## 2015-01-22 NOTE — Telephone Encounter (Signed)
Signed RX for Dr. Merla Richesoolittle.

## 2015-06-03 ENCOUNTER — Ambulatory Visit (INDEPENDENT_AMBULATORY_CARE_PROVIDER_SITE_OTHER): Payer: 59 | Admitting: Internal Medicine

## 2015-06-03 VITALS — BP 112/70 | HR 78 | Temp 98.2°F | Resp 14 | Ht 68.75 in | Wt 171.0 lb

## 2015-06-03 DIAGNOSIS — F411 Generalized anxiety disorder: Secondary | ICD-10-CM | POA: Diagnosis not present

## 2015-06-03 DIAGNOSIS — G44049 Chronic paroxysmal hemicrania, not intractable: Secondary | ICD-10-CM | POA: Diagnosis not present

## 2015-06-03 DIAGNOSIS — G4700 Insomnia, unspecified: Secondary | ICD-10-CM | POA: Diagnosis not present

## 2015-06-03 MED ORDER — CLONAZEPAM 0.5 MG PO TABS: ORAL_TABLET | ORAL | Status: DC

## 2015-06-03 MED ORDER — TRAZODONE HCL 150 MG PO TABS: ORAL_TABLET | ORAL | Status: DC

## 2015-06-03 MED ORDER — BUTALBITAL-APAP-CAFF-COD 50-325-40-30 MG PO CAPS: 1.0000 | ORAL_CAPSULE | ORAL | Status: DC | PRN

## 2015-06-03 NOTE — Progress Notes (Signed)
Subjective:  By signing my name below, I, [name], attest that this documentation has been prepared under the direction and in the presence of Ellamae Siaobert Quay Simkin, MD. Electronically Signed: [name], Scribe. 06/03/2015 , 4:09 PM .  Patient was seen in Room 9 .   Patient ID: Dalton Kelly, male    DOB: 09-20-1980, 35 y.o.   MRN: [id number] Chief Complaint  Patient presents with  . Medication Refill    Fioricet, Klonopin, Trazodone,   . Follow-up    Headache  . Medication Problem    Discuss Nadolol   HPI Dalton Kelly is a 35 y.o. male who [facility] medication refill on his fioricet, klonopin, and trazodone.   Medication He states Nadolol giving him side effects of fatigue when he takes it. It hasn't helped him with the frequency of his headaches. Most of his headaches occur in the morning. He's tried taking flexeril at bedtime in the past.  At an impasse as to what to do next next. Beta blockers =fatigue--SE topamax.  Patient Active Problem List   Diagnosis Date Noted  . Generalized anxiety disorder 06/24/2011  . Persistent headaches 06/24/2011  . Insomnia 06/24/2011    Current outpatient prescriptions:  .  butalbital-acetaminophen-caffeine (FIORICET WITH CODEINE) 50-325-40-30 MG capsule, Take 1 capsule by mouth every 4 (four) hours as needed for headache., Disp: 90 capsule, Rfl: 5 .  clonazePAM (KLONOPIN) 0.5 MG tablet, 1 am, 1 midday and 2 hs, Disp: 120 tablet, Rfl: 5 .  traZODone (DESYREL) 150 MG tablet, 1/2 at bedtime, Disp: 45 tablet, Rfl: 3 .  HYDROcodone-homatropine (HYCODAN) 5-1.5 MG/5ML syrup, Take 5 mLs by mouth every 6 (six) hours as needed. (Patient not taking: Reported on 06/03/2015), Disp: 120 mL, Rfl: 0 .  nadolol (CORGARD) 20 MG tablet, Take 1 tablet (20 mg total) by mouth daily. (Patient not taking: Reported on 06/03/2015), Disp: 90 tablet, Rfl: 1   Stressful job as Veterinary surgeoncounselor at [employer] Does well w/ response to acute HA rx but not to  preventives Good SSP relationship [place]ikes his work [place]  Review of Systems  Constitutional: Positive for fatigue. Negative for fever, chills and diaphoresis.  Respiratory: Negative for cough.   Cardiovascular: Negative for chest pain.  Gastrointestinal: Negative for nausea, vomiting, abdominal pain and diarrhea.  Neurological: Positive for headaches. Negative for dizziness, weakness and light-headedness.      Objective:   Physical Exam  Constitutional: He is oriented to person, place, and time. He appears well-developed and well-nourished. No distress.  HENT:  Head: Normocephalic and atraumatic.  Eyes: Conjunctivae and EOM are normal. Pupils are equal, round, and reactive to light.  Neck: Normal range of motion. Neck supple.  Cardiovascular: Normal rate.   Pulmonary/Chest: Effort normal. No respiratory distress.  Musculoskeletal: Normal range of motion.  Neurological: He is alert and oriented to person, place, and time. He has normal reflexes. No cranial nerve deficit.  Skin: Skin is warm and dry.  Psychiatric: He has a normal mood and affect. His behavior is normal. Judgment and thought content normal.  Nursing note and vitals reviewed.  BP 112/70 mmHg  Pulse 78  Temp(Src) 98.2 F (36.8 C) (Oral)  Resp 14  Ht 5' 8.75" (1.746 m)  Wt 171 lb (77.565 kg)  BMI 25.44 kg/m2  SpO2 98%    Assessment & Plan:  I have completed the patient encounter in its entirety as documented by the scribe, with editing by me where necessary. Molly Maduroobert  P. Merla Riches, M.D. GAD (generalized anxiety disorder) - Plan: clonazePAM (KLONOPIN) 0.5 MG tablet  Insomnia - Plan: traZODone (DESYREL) 150 MG tablet  Chronic paroxysmal hemicrania   Meds ordered this encounter  Medications  . butalbital-acetaminophen-caffeine (FIORICET WITH CODEINE) 50-325-40-30 MG capsule    Sig: Take 1 capsule by mouth every 4 (four) hours as needed for headache.    Dispense:  90 capsule    Refill:  5    . clonazePAM (KLONOPIN) 0.5 MG tablet    Sig: 1 am, 1 midday and 2 hs    Dispense:  120 tablet    Refill:  5  . traZODone (DESYREL) 150 MG tablet    Sig: 1/2 at bedtime    Dispense:  45 tablet    Refill:  3   Will refer to [facility] for HA eval--Dr Oralia Rud F/u here PCP as advised for he and partner

## 2015-06-03 NOTE — Patient Instructions (Signed)
     IF you received an x-ray today, you will receive an invoice from [facility]. Please contact [facility] at [phone] with questions or concerns regarding your invoice.   IF you received labwork today, you will receive an invoice from [facility]. Please contact [facility] at [phone] with questions or concerns regarding your invoice.   Our billing staff will not be able to assist you with questions regarding bills from these companies.  You will be contacted with the lab results as soon as they are available. The fastest way to get your results is to activate your My Chart account. Instructions are located on the last page of this paperwork. If you have not heard from us regarding the results in 2 weeks, please contact this office.      

## 2015-06-13 ENCOUNTER — Ambulatory Visit: Payer: 59 | Admitting: Internal Medicine

## 2015-06-16 ENCOUNTER — Encounter: Payer: Self-pay | Admitting: Internal Medicine

## 2015-06-16 ENCOUNTER — Other Ambulatory Visit: Payer: Self-pay | Admitting: Internal Medicine

## 2015-06-18 MED ORDER — PROMETHAZINE HCL 25 MG PO TABS: 25.0000 mg | ORAL_TABLET | Freq: Three times a day (TID) | ORAL | Status: DC | PRN

## 2015-09-26 ENCOUNTER — Ambulatory Visit (INDEPENDENT_AMBULATORY_CARE_PROVIDER_SITE_OTHER): Payer: 59 | Admitting: Physician Assistant

## 2015-09-26 VITALS — BP 118/78 | HR 81 | Temp 98.2°F | Resp 18 | Ht 67.75 in | Wt 172.0 lb

## 2015-09-26 DIAGNOSIS — J069 Acute upper respiratory infection, unspecified: Secondary | ICD-10-CM | POA: Diagnosis not present

## 2015-09-26 DIAGNOSIS — R05 Cough: Secondary | ICD-10-CM

## 2015-09-26 DIAGNOSIS — R110 Nausea: Secondary | ICD-10-CM

## 2015-09-26 DIAGNOSIS — R059 Cough, unspecified: Secondary | ICD-10-CM

## 2015-09-26 DIAGNOSIS — R197 Diarrhea, unspecified: Secondary | ICD-10-CM | POA: Diagnosis not present

## 2015-09-26 DIAGNOSIS — B9789 Other viral agents as the cause of diseases classified elsewhere: Secondary | ICD-10-CM

## 2015-09-26 DIAGNOSIS — R42 Dizziness and giddiness: Secondary | ICD-10-CM | POA: Diagnosis not present

## 2015-09-26 DIAGNOSIS — G43109 Migraine with aura, not intractable, without status migrainosus: Secondary | ICD-10-CM

## 2015-09-26 MED ORDER — IPRATROPIUM BROMIDE 0.06 % NA SOLN: 2.0000 | Freq: Three times a day (TID) | NASAL | 0 refills | Status: DC

## 2015-09-26 MED ORDER — HYDROCOD POLST-CPM POLST ER 10-8 MG/5ML PO SUER: 5.0000 mL | Freq: Two times a day (BID) | ORAL | 0 refills | Status: DC | PRN

## 2015-09-26 MED ORDER — ONDANSETRON 4 MG PO TBDP: 4.0000 mg | ORAL_TABLET | Freq: Three times a day (TID) | ORAL | 0 refills | Status: DC | PRN

## 2015-09-26 NOTE — Progress Notes (Signed)
Dalton Kelly  MRN: [id number] DOB: 05/10/1980  Subjective:  Pt presents to clinic with cold symptoms for 6 days -but they seem to be getting worse - he has congestion with a cough that is keeping him up at night and a congested nose without much rhinorrhea.  He has used proair without any relief from his cough and he is not sleeping well.  Diarrhea 2-3x/day like water, no recent abx and no recent travel - he has had some additional nausea with vomiting.  OTC meds - Nauzene, sudafed, mucinex, imodium  Review of Systems  Constitutional: Positive for appetite change (not hungry) and fatigue. Negative for chills and fever.  HENT: Positive for congestion, postnasal drip, rhinorrhea and sore throat.   Respiratory: Positive for cough (non-productive). Negative for shortness of breath and wheezing.        No h/o asthma, nonsmoker  Gastrointestinal: Positive for diarrhea and nausea. Negative for vomiting.  Allergic/Immunologic: Negative for environmental allergies.    Patient Active Problem List   Diagnosis Date Noted  . 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 Generalized anxiety disorder 06/24/2011  . Migraine 06/24/2011  . Insomnia 06/24/2011    Current Outpatient Prescriptions on File Prior to Visit  Medication Sig Dispense Refill  . butalbital-acetaminophen-caffeine (FIORICET WITH CODEINE) 50-325-40-30 MG capsule Take 1 capsule by mouth every 4 (four) hours as needed for headache. 90 capsule 5  . clonazePAM (KLONOPIN) 0.5 MG tablet 1 am, 1 midday and 2 hs 120 tablet 5  . traZODone (DESYREL) 150 MG tablet 1/2 at bedtime 45 tablet 3  . promethazine (PHENERGAN) 25 MG tablet Take 1 tablet (25 mg total) by mouth every 8 (eight) hours as needed for nausea or vomiting. (Patient not taking: Reported on 09/26/2015) 20 tablet 5   No current facility-administered medications on file prior to visit.     Allergies  Allergen Reactions  . Ibuprofen     Abdominal pain    Pt patients social history  was reviewed and updated.  Objective:  BP 118/78 (BP Location: Left Arm, Patient Position: Sitting, Cuff Size: Normal)   Pulse 81   Temp 98.2 F (36.8 C) (Oral)   Resp 18   Ht 5' 7.75" (1.721 m)   Wt 172 lb (78 kg)   SpO2 98%   BMI 26.35 kg/m   Physical Exam  Constitutional: He is oriented to person, place, and time and well-developed, well-nourished, and in no distress.  HENT:  Head: Normocephalic and atraumatic.  Right Ear: External ear normal.  Left Ear: External ear normal.  Eyes: Conjunctivae are normal.  Neck: Normal range of motion.  Cardiovascular: Normal rate, regular rhythm and normal heart sounds.   No murmur heard. Pulmonary/Chest: Effort normal and breath sounds normal. He has no wheezes.  Abdominal: Soft. Bowel sounds are normal. There is no tenderness.  Neurological: He is alert and oriented to person, place, and time. Gait normal.  Skin: Skin is warm and dry.  Psychiatric: Mood, memory, affect and judgment normal.   Orthostatic VS for the past 24 hrs:  BP- Lying Pulse- Lying BP- Sitting Pulse- Sitting BP- Standing at 0 minutes Pulse- Standing at 0 minutes  09/26/15 1818 117/82 62 108/75 80 112/87 80    Assessment and Plan :  Dizziness - Plan: Orthostatic vital signs  Cough  Diarrhea, unspecified type  Migraine with aura and without status migrainosus, not intractable  Viral URI with cough - Plan: ipratropium (ATROVENT) 0.06 % nasal spray, chlorpheniramine-HYDROcodone (TUSSIONEX PENNKINETIC ER) 10-8 MG/5ML  SUER  Nausea without vomiting - Plan: ondansetron (ZOFRAN ODT) 4 MG disintegrating tablet   Symptomatic treatment - he is compensating with his hydration status but he will be more effective with hydration if he is eating food - I suspect that his diarrhea is related to his virus as well as decrease food intake.  He will work on eating small meals and continuing his fluid intake.  He will continue the mucinex at home and he will start the above  medications to help with his uri symptoms.  He agrees and understands the plan.  The patient would like to have a CPE in the future and he was instructed on the best way to go about that.  [name] PA-C  [facility] 09/27/2015 2:40 PM

## 2015-09-26 NOTE — Patient Instructions (Signed)
     IF you received an x-ray today, you will receive an invoice from [facility]. Please contact [facility] at [phone] with questions or concerns regarding your invoice.   IF you received labwork today, you will receive an invoice from [facility]. Please contact [facility] at [phone] with questions or concerns regarding your invoice.   Our billing staff will not be able to assist you with questions regarding bills from these companies.  You will be contacted with the lab results as soon as they are available. The fastest way to get your results is to activate your My Chart account. Instructions are located on the last page of this paperwork. If you have not heard from us regarding the results in 2 weeks, please contact this office.      

## 2015-10-12 ENCOUNTER — Encounter: Payer: Self-pay | Admitting: Physician Assistant

## 2015-10-15 MED ORDER — BUTALBITAL-APAP-CAFF-COD 50-325-40-30 MG PO CAPS: 1.0000 | ORAL_CAPSULE | ORAL | 0 refills | Status: DC | PRN

## 2015-10-15 NOTE — Telephone Encounter (Signed)
Called in and notified pt on MyChart. 

## 2015-10-15 NOTE — Telephone Encounter (Signed)
Done for the patient - please call in

## 2015-10-20 ENCOUNTER — Ambulatory Visit (INDEPENDENT_AMBULATORY_CARE_PROVIDER_SITE_OTHER): Payer: 59 | Admitting: Physician Assistant

## 2015-10-20 VITALS — BP 108/68 | HR 80 | Resp 17 | Ht 68.5 in | Wt 177.0 lb

## 2015-10-20 DIAGNOSIS — Z0000 Encounter for general adult medical examination without abnormal findings: Secondary | ICD-10-CM | POA: Diagnosis not present

## 2015-10-20 DIAGNOSIS — R5382 Chronic fatigue, unspecified: Secondary | ICD-10-CM | POA: Diagnosis not present

## 2015-10-20 DIAGNOSIS — Z23 Encounter for immunization: Secondary | ICD-10-CM | POA: Diagnosis not present

## 2015-10-20 DIAGNOSIS — Z114 Encounter for screening for human immunodeficiency virus [HIV]: Secondary | ICD-10-CM

## 2015-10-20 DIAGNOSIS — Z13228 Encounter for screening for other metabolic disorders: Secondary | ICD-10-CM

## 2015-10-20 DIAGNOSIS — Z1329 Encounter for screening for other suspected endocrine disorder: Secondary | ICD-10-CM

## 2015-10-20 DIAGNOSIS — Z130 Encounter for screening for diseases of the blood and blood-forming organs and certain disorders involving the immune mechanism: Secondary | ICD-10-CM | POA: Diagnosis not present

## 2015-10-20 DIAGNOSIS — Z13220 Encounter for screening for lipoid disorders: Secondary | ICD-10-CM

## 2015-10-20 DIAGNOSIS — F411 Generalized anxiety disorder: Secondary | ICD-10-CM | POA: Diagnosis not present

## 2015-10-20 LAB — CBC WITH DIFFERENTIAL/PLATELET
BASOS PCT: 1 %
Basophils Absolute: 75 cells/uL (ref 0–200)
EOS ABS: 525 {cells}/uL — AB (ref 15–500)
EOS PCT: 7 %
HCT: 43.9 % (ref 38.5–50.0)
Hemoglobin: 15.5 g/dL (ref 13.2–17.1)
Lymphocytes Relative: 39 %
Lymphs Abs: 2925 cells/uL (ref 850–3900)
MCH: 32.6 pg (ref 27.0–33.0)
MCHC: 35.3 g/dL (ref 32.0–36.0)
MCV: 92.4 fL (ref 80.0–100.0)
MONOS PCT: 7 %
MPV: 9.2 fL (ref 7.5–12.5)
Monocytes Absolute: 525 cells/uL (ref 200–950)
NEUTROS ABS: 3450 {cells}/uL (ref 1500–7800)
Neutrophils Relative %: 46 %
PLATELETS: 265 10*3/uL (ref 140–400)
RBC: 4.75 MIL/uL (ref 4.20–5.80)
RDW: 12.4 % (ref 11.0–15.0)
WBC: 7.5 10*3/uL (ref 3.8–10.8)

## 2015-10-20 LAB — COMPLETE METABOLIC PANEL WITH GFR
ALT: 28 U/L (ref 9–46)
AST: 24 U/L (ref 10–40)
Albumin: 4.7 g/dL (ref 3.6–5.1)
Alkaline Phosphatase: 68 U/L (ref 40–115)
BILIRUBIN TOTAL: 0.4 mg/dL (ref 0.2–1.2)
BUN: 11 mg/dL (ref 7–25)
CO2: 30 mmol/L (ref 20–31)
CREATININE: 0.88 mg/dL (ref 0.60–1.35)
Calcium: 9.1 mg/dL (ref 8.6–10.3)
Chloride: 98 mmol/L (ref 98–110)
GFR, Est African American: >89 mL/min (ref >=60)
GFR, Est Non African American: >89 mL/min (ref >=60)
GLUCOSE: 81 mg/dL (ref 65–99)
Potassium: 4.2 mmol/L (ref 3.5–5.3)
SODIUM: 136 mmol/L (ref 135–146)
TOTAL PROTEIN: 7 g/dL (ref 6.1–8.1)

## 2015-10-20 LAB — LIPID PANEL
Cholesterol: 197 mg/dL (ref 125–200)
HDL: 51 mg/dL (ref >=40)
LDL CALC: 114 mg/dL (ref <130)
Total CHOL/HDL Ratio: 3.9 Ratio (ref <=5.0)
Triglycerides: 159 mg/dL — ABNORMAL HIGH (ref <150)
VLDL: 32 mg/dL — ABNORMAL HIGH (ref <30)

## 2015-10-20 LAB — T4, FREE: FREE T4: 1.1 ng/dL (ref 0.8–1.8)

## 2015-10-20 LAB — TSH: TSH: 1.26 m[IU]/L (ref 0.40–4.50)

## 2015-10-20 LAB — HIV ANTIBODY (ROUTINE TESTING W REFLEX): HIV 1&2 Ab, 4th Generation: NONREACTIVE

## 2015-10-20 MED ORDER — ESCITALOPRAM OXALATE 10 MG PO TABS: 10.0000 mg | ORAL_TABLET | Freq: Every day | ORAL | 0 refills | Status: DC

## 2015-10-20 NOTE — Patient Instructions (Signed)
     IF you received an x-ray today, you will receive an invoice from [facility]. Please contact [facility] at [phone] with questions or concerns regarding your invoice.   IF you received labwork today, you will receive an invoice from [facility]. Please contact [facility] at [phone] with questions or concerns regarding your invoice.   Our billing staff will not be able to assist you with questions regarding bills from these companies.  You will be contacted with the lab results as soon as they are available. The fastest way to get your results is to activate your My Chart account. Instructions are located on the last page of this paperwork. If you have not heard from us regarding the results in 2 weeks, please contact this office.      

## 2015-10-20 NOTE — Progress Notes (Signed)
Dalton Kelly  MRN: [id number] DOB: [birth year]  Subjective:  Pt presents to clinic for a CPE.  He is always fatigued - he has a stressful job and does not sleep well due to work issues. He has known anxiety and has been treated for years with Klonopin tid and that helps - he does have breakthrough anxiety but no anxiety or panic attacks.  He takes the Klonopin scheduled and does not miss a dose.  He has a therapist who is sees regularly.  He has almost daily headaches and has tried triptans but they do not help so he uses Fioricet sucessfully.  Pt is worried he has an immune system problem because he gets sick all the time.  He has problems with feeling like he is going to pass out a lot and he drinks a lot of electrolyte beverages and rests.  He now knows what to do when he feels bad.  Last dental exam: every 6 months Last vision exam: never   Vaccinations      Tetanus - unsure  Patient Active Problem List   Diagnosis Date Noted  . 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 Generalized anxiety disorder 06/24/2011  . Migraine 06/24/2011  . Insomnia 06/24/2011    Current Outpatient Prescriptions on File Prior to Visit  Medication Sig Dispense Refill  . butalbital-acetaminophen-caffeine (FIORICET WITH CODEINE) 50-325-40-30 MG capsule Take 1 capsule by mouth every 4 (four) hours as needed for headache. 90 capsule 0  . clonazePAM (KLONOPIN) 0.5 MG tablet 1 am, 1 midday and 2 hs 120 tablet 5  . traZODone (DESYREL) 150 MG tablet 1/2 at bedtime 45 tablet 3   No current facility-administered medications on file prior to visit.     Allergies  Allergen Reactions  . Ibuprofen     Abdominal pain    Social History   Social History  . Marital status: Married    Spouse name: [name]  . Number of children: N/A  . Years of education: N/A   Occupational History  . therapist     [employer]   Social History Main Topics  . Smoking status: Former Games developer  . Smokeless tobacco: Never Used  . Alcohol  use No  . Drug use: No  . Sexual activity: Yes    Partners: Male   Other Topics Concern  . None   Social History Narrative   Mental Health therapist at [employer]    Married - [name]    Past Surgical History:  Procedure Laterality Date  . WISDOM TOOTH EXTRACTION      Family History  Problem Relation Age of Onset  . Cancer Mother   . Hypertension Father   . Stroke Maternal Grandmother   . Alzheimer's disease Maternal Grandmother   . Cancer Maternal Grandfather   . Diabetes Maternal Grandfather   . Cancer Paternal Grandmother   . Heart failure Paternal Grandfather     Review of Systems  Constitutional: Negative.   HENT: Negative.   Eyes: Negative.   Respiratory: Negative.   Cardiovascular: Negative.   Gastrointestinal: Negative.   Endocrine: Negative.   Genitourinary: Negative.   Musculoskeletal: Negative.   Skin: Negative.   Allergic/Immunologic: Negative.   Neurological: Negative.   Hematological: Negative.   Psychiatric/Behavioral: Negative.    Objective:  BP 108/68 (BP Location: Right Arm, Patient Position: Sitting, Cuff Size: Normal)   Pulse 80   Resp 17   Ht 5' 8.5" (1.74 m)   Wt 177 lb (80.3 kg)  SpO2 99%   BMI 26.52 kg/m   Physical Exam  Constitutional: He is oriented to person, place, and time and well-developed, well-nourished, and in no distress.  HENT:  Head: Normocephalic and atraumatic.  Right Ear: Hearing, tympanic membrane, external ear and ear canal normal.  Left Ear: Hearing, tympanic membrane, external ear and ear canal normal.  Nose: Nose normal.  Mouth/Throat: Uvula is midline, oropharynx is clear and moist and mucous membranes are normal.  Eyes: Conjunctivae and EOM are normal. Pupils are equal, round, and reactive to light.  Neck: Trachea normal and normal range of motion. Neck supple. No thyroid mass and no thyromegaly present.  Cardiovascular: Normal rate, regular rhythm and normal heart sounds.   No murmur  heard. Pulmonary/Chest: Effort normal and breath sounds normal.  Abdominal: Soft. Bowel sounds are normal.  Musculoskeletal: Normal range of motion.  Neurological: He is alert and oriented to person, place, and time. Gait normal.  Skin: Skin is warm and dry.  Psychiatric: Memory, affect and judgment normal. His mood appears anxious.  Pt is physically anxious in the room    Visual Acuity Screening   Right eye Left eye Both eyes  Without correction: 20/15 20/15 20/15   With correction:      Spent > 1h with patient with >50% spent in counseling and reviewing his history Assessment and Plan :  Annual physical exam - anticipatory guidance given to patient  Need for Tdap vaccination - Plan: Tdap vaccine greater than or equal to 7yo IM  Screening for deficiency anemia - Plan: CBC with Differential/Platelet  Screening for metabolic disorder - Plan: COMPLETE METABOLIC PANEL WITH GFR  Screening cholesterol level - Plan: Lipid panel  Screening for thyroid disorder - Plan: TSH   Encounter for screening for HIV - Plan: HIV antibody  Chronic fatigue - Plan: T4, Free - check labs though expect related to GAD and disrupted sleeo  GAD (generalized anxiety disorder) - Plan: escitalopram (LEXAPRO) 10 MG tablet - not controlled on Klonopin - we discussed at length that Klonopin is not a long term treatment for anxiety and that it is more like a band-aid rather than a true treatment - he does not misuse the klonopin but he does need a daily treatment - after a lot of convincing pt reluctantly agrees to try this medication - he is wondering why we have to change what is working but recognizes the problem and the fact that he is still anxious - he is very worried about sexual side effects  Headaches - he will continue his treatment -- but suggested he think about trial of PT to help with daily headache - I hope that start of SSRI will also help to better control his anxiety which in turn will help with his  headaches   Benny LennertSarah Caria Transue PA-C  [facility] 10/20/2015 1:48 PM

## 2015-10-26 ENCOUNTER — Encounter: Payer: Self-pay | Admitting: Physician Assistant

## 2015-10-26 DIAGNOSIS — F411 Generalized anxiety disorder: Secondary | ICD-10-CM

## 2015-10-26 MED ORDER — DULOXETINE HCL 20 MG PO CPEP: 20.0000 mg | ORAL_CAPSULE | Freq: Every day | ORAL | 0 refills | Status: DC

## 2015-11-16 ENCOUNTER — Encounter: Payer: Self-pay | Admitting: Physician Assistant

## 2015-11-16 DIAGNOSIS — G4700 Insomnia, unspecified: Secondary | ICD-10-CM

## 2015-11-16 DIAGNOSIS — F411 Generalized anxiety disorder: Secondary | ICD-10-CM

## 2015-11-17 ENCOUNTER — Other Ambulatory Visit: Payer: Self-pay | Admitting: Physician Assistant

## 2015-11-17 DIAGNOSIS — F411 Generalized anxiety disorder: Secondary | ICD-10-CM

## 2015-11-17 MED ORDER — CLONAZEPAM 0.5 MG PO TABS: ORAL_TABLET | ORAL | 0 refills | Status: DC

## 2015-11-17 MED ORDER — BUTALBITAL-APAP-CAFF-COD 50-325-40-30 MG PO CAPS: 1.0000 | ORAL_CAPSULE | ORAL | 0 refills | Status: DC | PRN

## 2015-11-17 MED ORDER — DULOXETINE HCL 20 MG PO CPEP: 20.0000 mg | ORAL_CAPSULE | Freq: Every day | ORAL | 0 refills | Status: DC

## 2015-11-17 MED ORDER — TRAZODONE HCL 150 MG PO TABS: ORAL_TABLET | ORAL | 0 refills | Status: DC

## 2015-11-17 NOTE — Telephone Encounter (Signed)
Done please send to pharmacy [facility]

## 2015-11-19 NOTE — Telephone Encounter (Signed)
Wasn't sure if these were called in over the weekend, so called in clonazepam and fioricet today w/message might be duplicates.

## 2015-11-20 ENCOUNTER — Ambulatory Visit: Payer: Self-pay | Admitting: Family Medicine

## 2015-11-21 ENCOUNTER — Ambulatory Visit: Payer: 59

## 2015-12-01 ENCOUNTER — Encounter: Payer: Self-pay | Admitting: Physician Assistant

## 2015-12-01 DIAGNOSIS — M545 Low back pain, unspecified: Secondary | ICD-10-CM | POA: Insufficient documentation

## 2015-12-15 ENCOUNTER — Ambulatory Visit (INDEPENDENT_AMBULATORY_CARE_PROVIDER_SITE_OTHER): Payer: 59 | Admitting: Physician Assistant

## 2015-12-15 VITALS — BP 112/64 | HR 92 | Temp 98.1°F | Resp 16 | Ht 69.0 in | Wt 178.0 lb

## 2015-12-15 DIAGNOSIS — F411 Generalized anxiety disorder: Secondary | ICD-10-CM

## 2015-12-15 DIAGNOSIS — G43109 Migraine with aura, not intractable, without status migrainosus: Secondary | ICD-10-CM

## 2015-12-15 MED ORDER — BUTALBITAL-APAP-CAFF-COD 50-325-40-30 MG PO CAPS: 1.0000 | ORAL_CAPSULE | ORAL | 0 refills | Status: DC | PRN

## 2015-12-15 MED ORDER — CLONAZEPAM 0.5 MG PO TABS: ORAL_TABLET | ORAL | 0 refills | Status: DC

## 2015-12-15 MED ORDER — DULOXETINE HCL 20 MG PO CPEP: 40.0000 mg | ORAL_CAPSULE | Freq: Every day | ORAL | 0 refills | Status: DC

## 2015-12-15 NOTE — Patient Instructions (Signed)
     IF you received an x-ray today, you will receive an invoice from [facility]. Please contact [facility] at [phone] with questions or concerns regarding your invoice.   IF you received labwork today, you will receive an invoice from [facility]. Please contact [facility] at [phone] with questions or concerns regarding your invoice.   Our billing staff will not be able to assist you with questions regarding bills from these companies.  You will be contacted with the lab results as soon as they are available. The fastest way to get your results is to activate your My Chart account. Instructions are located on the last page of this paperwork. If you have not heard from us regarding the results in 2 weeks, please contact this office.      

## 2015-12-15 NOTE — Progress Notes (Signed)
TEKOA AMON  MRN: [id number] DOB: [birth year]  Subjective:  Pt presents to clinic for a recheck and a medication check.   GAD - less tired and more energy - tolerating the dose ok -- takes it in the evening with crackers - he needs to get a 90 supply of medications for his insurance - it has not changed his anxiety or headache much but he does think it is helping - never tried Buspar - taking Klonopin daily  Insomnia - at higher trazodone doses he developed headache in the am - he has never tried remeron  Headache - several headaches a week - takes it during the week but typically not on the weekends - rare days he takes 4 but most days it is 3 days - he has had a full work-up with neurologist with many preventative medications tried and failed - before patient was put on Fioricet he was existing but not living - he would barely get through work and have to come home and sleep but now he is enjoying his life - he does not know what he would do without this - in PT for this - pt becomes tearful when talking about time without the medications  Back pain - chronic - decreases his ability to do anything physical - see Dr Madelon Lips who gives him Norco and Flexeril to sleep -- in PT for this - never takes the Norco and the Fioricet together --   D/w pt that he needs to make sure all people involved in his medical care are aware of all his medications not just the ones they Rx to him - he states that he thought he only needed to let us know the ones that he needed refills on  [place] drug database is consistent with pt stated use of medication  Review of Systems  Constitutional: Negative for chills and fever.  Musculoskeletal: Positive for back pain.  Neurological: Positive for headaches.  Psychiatric/Behavioral: Positive for sleep disturbance. The patient is nervous/anxious.     Patient Active Problem List   Diagnosis Date Noted  . Low back pain 12/01/2015  . 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  Generalized anxiety disorder 06/24/2011  . Migraine 06/24/2011  . Insomnia 06/24/2011    Current Outpatient Prescriptions on File Prior to Visit  Medication Sig Dispense Refill  . traZODone (DESYREL) 150 MG tablet 1/2 at bedtime 45 tablet 0   No current facility-administered medications on file prior to visit.     Allergies  Allergen Reactions  . Ibuprofen     Abdominal pain    Pt patients past, family and social history were reviewed and updated.  Objective:  BP 112/64 (BP Location: Right Arm, Patient Position: Sitting, Cuff Size: Normal)   Pulse 92   Temp 98.1 F (36.7 C)   Resp 16   Ht 5\' 9"  (1.753 m)   Wt 178 lb (80.7 kg)   SpO2 99%   BMI 26.29 kg/m   Physical Exam  Constitutional: He is oriented to person, place, and time and well-developed, well-nourished, and in no distress.  HENT:  Head: Normocephalic and atraumatic.  Right Ear: External ear normal.  Left Ear: External ear normal.  Eyes: Conjunctivae are normal.  Neck: Normal range of motion.  Pulmonary/Chest: Effort normal.  Neurological: He is alert and oriented to person, place, and time. Gait normal.  Skin: Skin is warm and dry.  Psychiatric: Mood, memory, affect and judgment normal.  Tearful when talking about his migraines/headaches  and the medication that he is using   Spent an hour with the patient face to face with >50% in counseling Assessment and Plan :  Migraine with aura and without status migrainosus, not intractable - Plan: butalbital-acetaminophen-caffeine (FIORICET WITH CODEINE) 50-325-40-30 MG capsule - continue current plan - 1 RF ok - he has recheck in 8 weeks  GAD (generalized anxiety disorder) - Plan: DULoxetine (CYMBALTA) 20 MG capsule, clonazePAM (KLONOPIN) 0.5 MG tablet  - increase Cymbalta to 40mg  for the next 8 weeks - our goal will be to decrease his klonopin - currently his lowest anxiety time of the time is mid/late afternoon so that will be the 1st dose that we decrease - I  really think that the patient can decrease his medication but he has been taking it so long he feels like he needs it and he is scared that he will go back to where he once was which is understandable. He is very anxious about change in medications but he is willing - if the taper of klonopin does not work once the cymbalta is at therapeutic dose we will ry Buspar as a way to decrease his anxiety.  Pt needs to let all of his providers knows about all his medications - I do find it concerning that the patient does not tell all his providers regarding his multiple controlled substance.  We discussed at length the possible reactions of these medications and he states that he has been on them and never had trouble though he is willing to continue to work to get off the Klonopin and decrease use of Norco as long as we agree to leave his Fioricet alone as he is able to function on that medications and I think as long as he is making effort that is what I ask.   Benny LennertSarah Weber PA-C  [facility] 12/15/2015 5:08 PM

## 2015-12-26 ENCOUNTER — Encounter: Payer: Self-pay | Admitting: Physician Assistant

## 2016-01-09 ENCOUNTER — Encounter: Payer: Self-pay | Admitting: Physician Assistant

## 2016-01-09 DIAGNOSIS — F411 Generalized anxiety disorder: Secondary | ICD-10-CM

## 2016-01-09 DIAGNOSIS — G43109 Migraine with aura, not intractable, without status migrainosus: Secondary | ICD-10-CM

## 2016-01-10 ENCOUNTER — Other Ambulatory Visit: Payer: Self-pay | Admitting: Physician Assistant

## 2016-01-10 ENCOUNTER — Telehealth: Payer: Self-pay

## 2016-01-10 DIAGNOSIS — F411 Generalized anxiety disorder: Secondary | ICD-10-CM

## 2016-01-10 DIAGNOSIS — G43109 Migraine with aura, not intractable, without status migrainosus: Secondary | ICD-10-CM

## 2016-01-10 MED ORDER — BUTALBITAL-APAP-CAFF-COD 50-325-40-30 MG PO CAPS: 1.0000 | ORAL_CAPSULE | ORAL | 0 refills | Status: DC | PRN

## 2016-01-10 MED ORDER — CLONAZEPAM 0.5 MG PO TABS: ORAL_TABLET | ORAL | 0 refills | Status: DC

## 2016-01-10 NOTE — Telephone Encounter (Signed)
Last OV 10/21 and you gave pt Rxs for both of these meds then.

## 2016-01-10 NOTE — Telephone Encounter (Signed)
PT calling in regards to his email to you he wanted to make sure he got his prescription in before you go on vacation     Thanks!

## 2016-01-10 NOTE — Telephone Encounter (Signed)
Ready - pt knows through mychart. 

## 2016-01-10 NOTE — Telephone Encounter (Signed)
Faxed to pharmacy

## 2016-01-23 ENCOUNTER — Emergency Department (HOSPITAL_BASED_OUTPATIENT_CLINIC_OR_DEPARTMENT_OTHER)
Admission: EM | Admit: 2016-01-23 | Discharge: 2016-01-23 | Disposition: A | Discharge status: Home / Self Care | Payer: 59 | Attending: Emergency Medicine | Admitting: Emergency Medicine

## 2016-01-23 ENCOUNTER — Encounter (HOSPITAL_BASED_OUTPATIENT_CLINIC_OR_DEPARTMENT_OTHER): Payer: Self-pay | Admitting: *Deleted

## 2016-01-23 ENCOUNTER — Emergency Department (HOSPITAL_BASED_OUTPATIENT_CLINIC_OR_DEPARTMENT_OTHER): Payer: 59

## 2016-01-23 DIAGNOSIS — R51 Headache: Secondary | ICD-10-CM | POA: Diagnosis present

## 2016-01-23 DIAGNOSIS — Z87891 Personal history of nicotine dependence: Secondary | ICD-10-CM | POA: Insufficient documentation

## 2016-01-23 DIAGNOSIS — G43009 Migraine without aura, not intractable, without status migrainosus: Secondary | ICD-10-CM | POA: Diagnosis not present

## 2016-01-23 HISTORY — DX: Dorsalgia, unspecified: M54.9

## 2016-01-23 IMAGING — CT CT HEAD W/O CM
3 series · 14 of 47 positions shown, 16 images · non-contrast
Comparison: None.

CLINICAL DATA: Severe headaches for 3 days

EXAM:
CT HEAD WITHOUT CONTRAST
TECHNIQUE: Contiguous axial images were obtained from the base of the skull
through the vertex without intravenous contrast.

[Series 2: head wo · axial · 0.46mm/px · z∈[+932,+1057]mm · 8 of 31 slices shown, 10 images]
[im 3/31  brain]
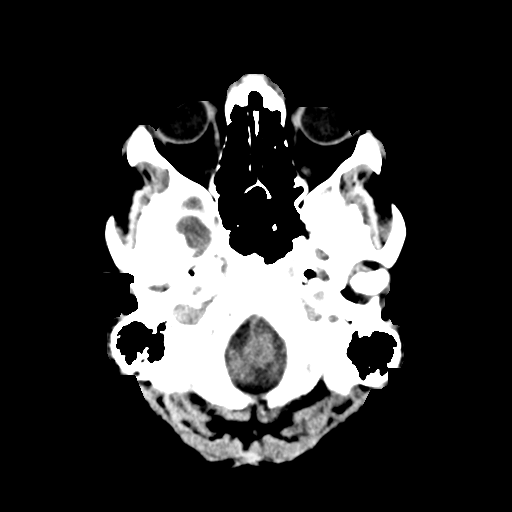
[im 3/31  bone]
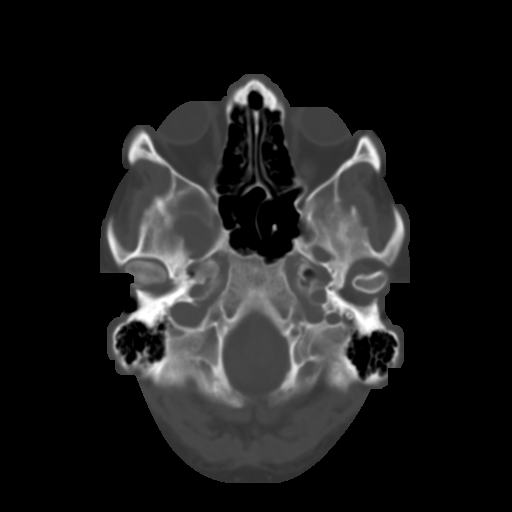
[im 7/31  brain]
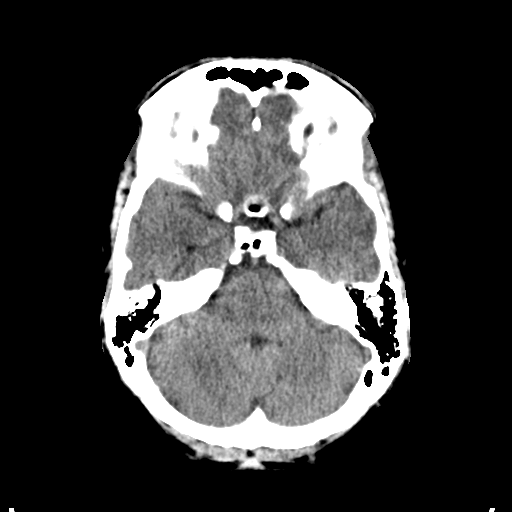
[im 10/31  brain]
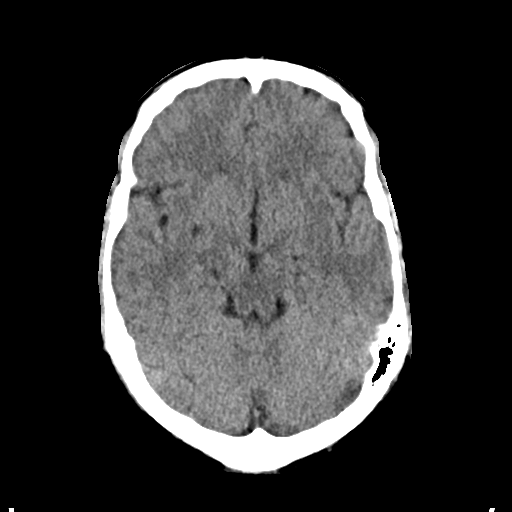
[im 14/31  brain]
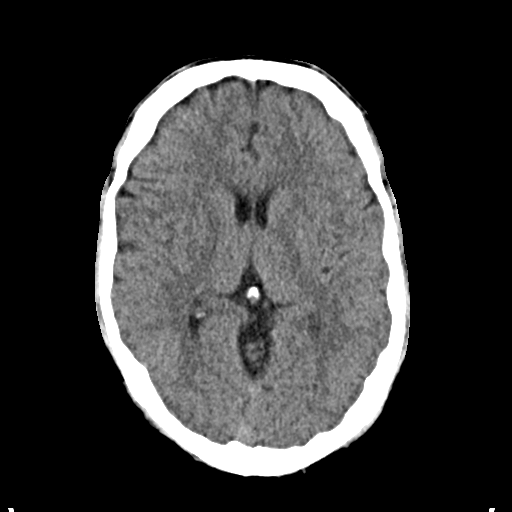
[im 17/31  brain]
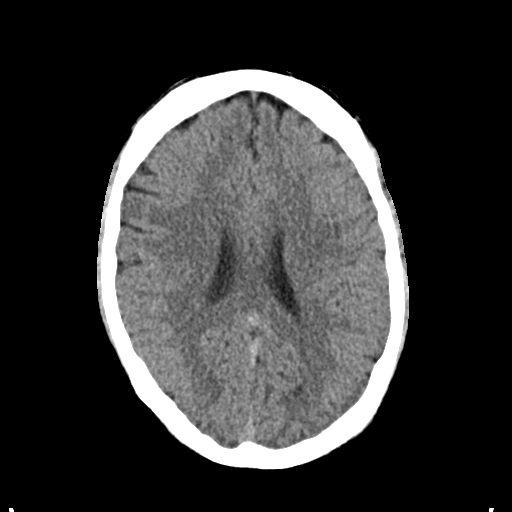
[im 17/31  bone]
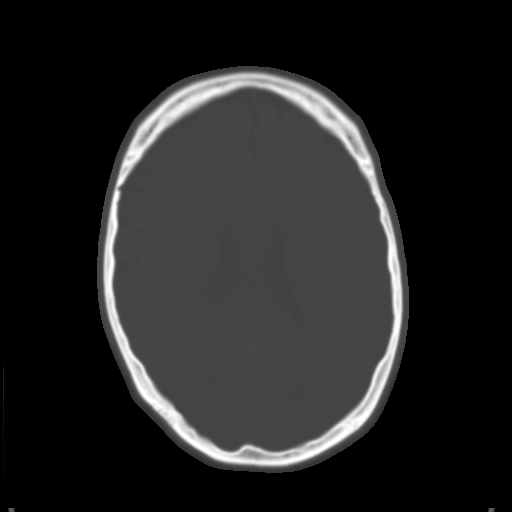
[im 21/31  brain]
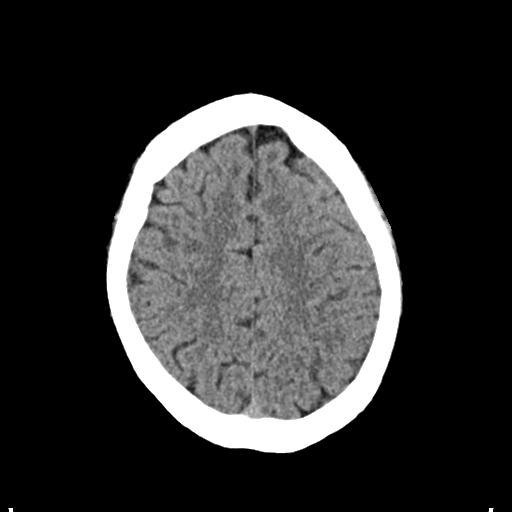
[im 24/31  brain]
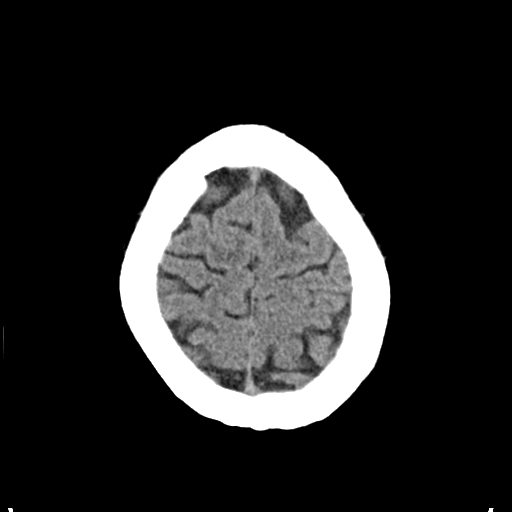
[im 28/31  brain]
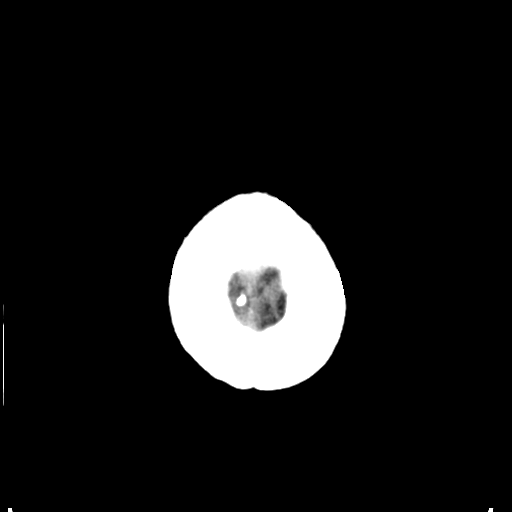

[Series 4: coronal soft · coronal · 0.31mm/px · 3 of 67 slices shown]
[im 23/67  brain]
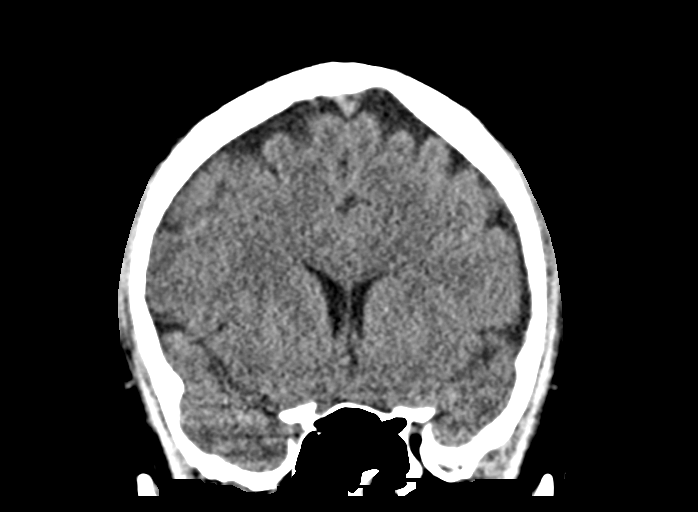
[im 30/67  brain]
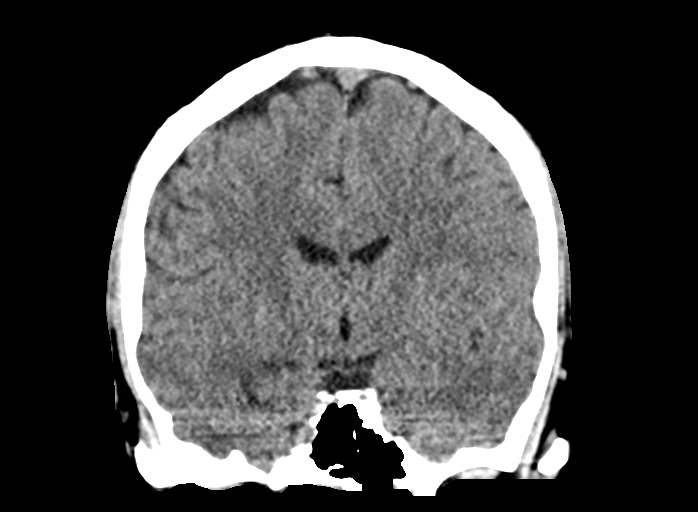
[im 37/67  brain]
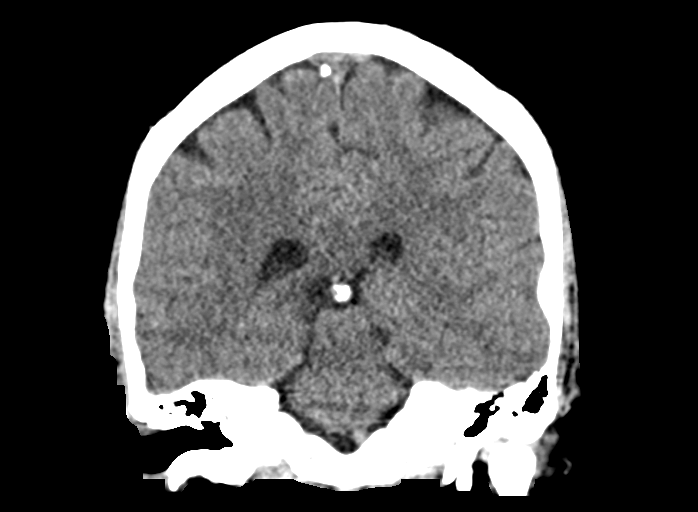

[Series 5: sag soft · sagittal · 0.29mm/px · 3 of 67 slices shown]
[im 23/67  brain]
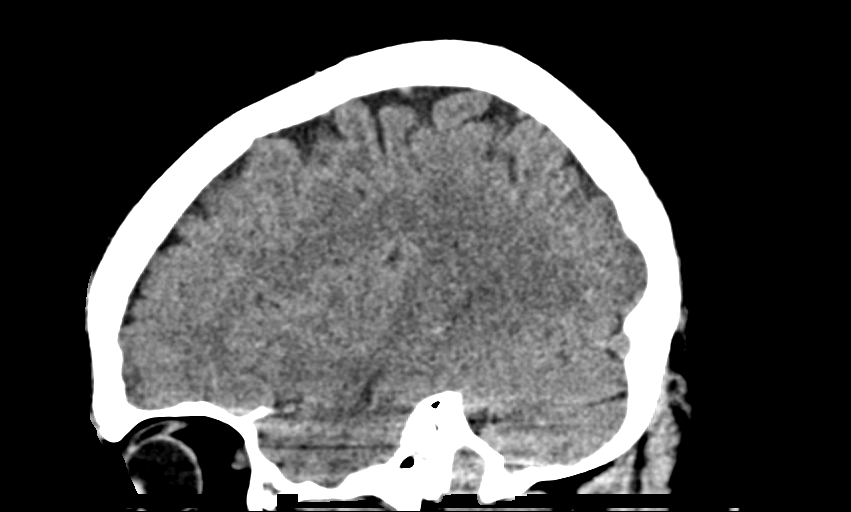
[im 34/67  brain]
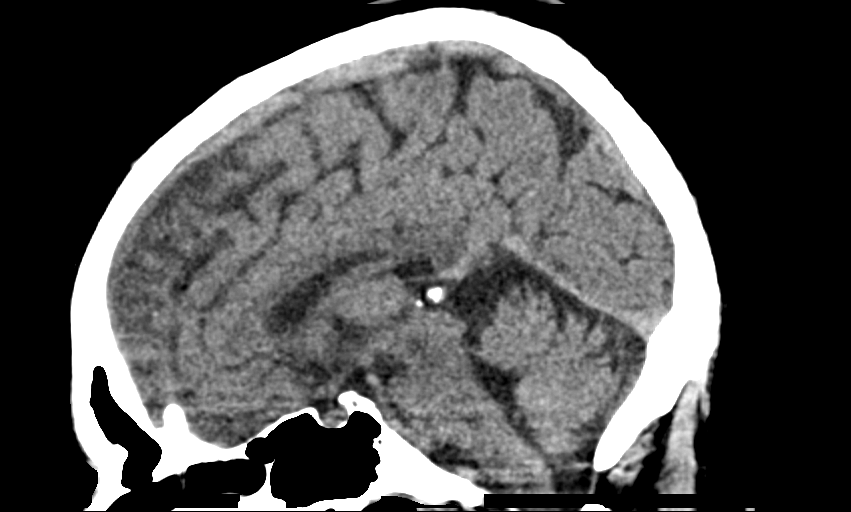
[im 45/67  brain]
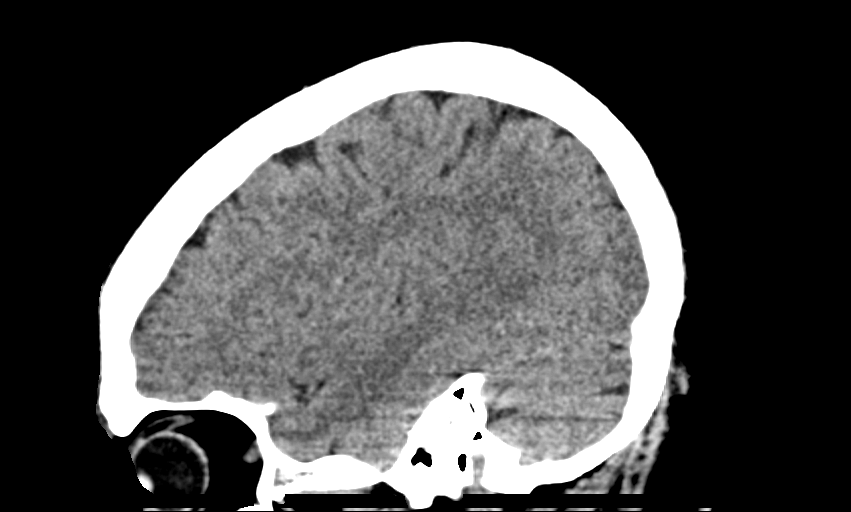

[14 of 47 positions shown; findings below may reference images not displayed]

FINDINGS: Brain: No evidence of acute infarction, hemorrhage, hydrocephalus,
extra-axial collection or mass lesion/mass effect.

Vascular: No hyperdense vessel or unexpected calcification.

Skull: Normal. Negative for fracture or focal lesion.

Sinuses/Orbits: No acute finding.

Other: None.
IMPRESSION: No acute intracranial abnormality noted.

## 2016-01-23 MED ORDER — MORPHINE SULFATE (PF) 4 MG/ML IV SOLN
4.0000 mg | Freq: Once | INTRAVENOUS | Status: AC
  Administered 2016-01-23: 4 mg via INTRAVENOUS
  Filled 2016-01-23: qty 1

## 2016-01-23 MED ORDER — KETOROLAC TROMETHAMINE 30 MG/ML IJ SOLN
30.0000 mg | Freq: Once | INTRAMUSCULAR | Status: AC
  Administered 2016-01-23: 30 mg via INTRAVENOUS
  Filled 2016-01-23: qty 1

## 2016-01-23 MED ORDER — PROMETHAZINE HCL 25 MG/ML IJ SOLN
12.5000 mg | Freq: Once | INTRAMUSCULAR | Status: AC
  Administered 2016-01-23: 12.5 mg via INTRAVENOUS
  Filled 2016-01-23 (×2): qty 1

## 2016-01-23 MED ORDER — OXYCODONE-ACETAMINOPHEN 5-325 MG PO TABS: 1.0000 | ORAL_TABLET | ORAL | 0 refills | Status: DC | PRN

## 2016-01-23 MED ORDER — PROCHLORPERAZINE MALEATE 10 MG PO TABS
10.0000 mg | ORAL_TABLET | Freq: Once | ORAL | Status: AC
  Administered 2016-01-23: 10 mg via ORAL
  Filled 2016-01-23: qty 1

## 2016-01-23 MED ORDER — TRAMADOL HCL 50 MG PO TABS
50.0000 mg | ORAL_TABLET | Freq: Once | ORAL | Status: AC
  Administered 2016-01-23: 50 mg via ORAL
  Filled 2016-01-23: qty 1

## 2016-01-23 MED ORDER — DIPHENHYDRAMINE HCL 50 MG/ML IJ SOLN
12.5000 mg | Freq: Once | INTRAMUSCULAR | Status: AC
  Administered 2016-01-23: 12.5 mg via INTRAVENOUS
  Filled 2016-01-23: qty 1

## 2016-01-23 MED ORDER — OXYCODONE-ACETAMINOPHEN 5-325 MG PO TABS
1.0000 | ORAL_TABLET | Freq: Once | ORAL | Status: AC
  Administered 2016-01-23: 1 via ORAL
  Filled 2016-01-23: qty 1

## 2016-01-23 MED ORDER — DIPHENHYDRAMINE HCL 50 MG/ML IJ SOLN: 50.0000 mg | Freq: Once | INTRAMUSCULAR | Status: DC

## 2016-01-23 MED ORDER — METOCLOPRAMIDE HCL 5 MG/ML IJ SOLN: 10.0000 mg | Freq: Once | INTRAMUSCULAR | Status: DC

## 2016-01-23 MED ORDER — SODIUM CHLORIDE 0.9 % IV BOLUS (SEPSIS)
1000.0000 mL | Freq: Once | INTRAVENOUS | Status: AC
  Administered 2016-01-23: 1000 mL via INTRAVENOUS

## 2016-01-23 NOTE — ED Notes (Signed)
ED Provider at bedside. 

## 2016-01-23 NOTE — ED Notes (Signed)
ED Provider at bedside discussing dispo plan of care. 

## 2016-01-23 NOTE — ED Triage Notes (Signed)
Pt c/o migraine x 3 days

## 2016-01-23 NOTE — ED Provider Notes (Signed)
Emergency Department Provider Note   I have reviewed the triage vital signs and the nursing notes.   HISTORY  Chief Complaint Migraine   HPI Dalton Kelly is a 35 y.o. male with PMH of anxiety, migraine headaches, and fatigue presents to the emergency department for evaluation of intractable migraine headache for the past 3 days. The patient describes his headache as left sided, throbbing, with associated photophobia. He has migraine medications at home which she's been taking with little to no improvement. In the past he is followed with a neurologist and took preventative medication but had reactions to Imitrex in the past. He's been taking Fioricet. He denies any sudden onset maximal intensity headache. He reports symptoms worse in the morning upon waking. No associated weakness or numbness. No fever or neck pain.    Past Medical History:  Diagnosis Date  . Anxiety   . Back pain   . Episode of syncope    Recent that sounds vasovagal  . Fatigue   . Generalized anxiety disorder   . Hair loss   . Insomnia   . Irregular bowel habits   . Systolic click     Patient Active Problem List   Diagnosis Date Noted  . Low back pain 12/01/2015  . 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 Generalized anxiety disorder 06/24/2011  . Migraine 06/24/2011  . Insomnia 06/24/2011    Past Surgical History:  Procedure Laterality Date  . WISDOM TOOTH EXTRACTION      Current Outpatient Rx  . Order #: [id number] Class: Print  . Order #: [id number] Class: Print  . Order #: [id number] Class: Historical Med  . Order #: [id number] Class: Normal  . Order #: [id number] Class: Historical Med  . Order #: [id number] Class: Print  . Order #: [id number] Class: Normal    Allergies Ibuprofen  Family History  Problem Relation Age of Onset  . Cancer Mother   . Hypertension Father   . Stroke Maternal Grandmother   . Alzheimer's disease Maternal Grandmother   . Cancer Maternal Grandfather   . Diabetes Maternal  Grandfather   . Cancer Paternal Grandmother   . Heart failure Paternal Grandfather     Social History Social History  Substance Use Topics  . Smoking status: Former Games developermoker  . Smokeless tobacco: Never Used  . Alcohol use No    Review of Systems  Constitutional: No fever/chills Eyes: No visual changes. ENT: No sore throat. Cardiovascular: Denies chest pain. Respiratory: Denies shortness of breath. Gastrointestinal: No abdominal pain.  No nausea, no vomiting.  No diarrhea.  No constipation. Genitourinary: Negative for dysuria. Musculoskeletal: Negative for back pain. Skin: Negative for rash. Neurological: Negative for focal weakness or numbness. Positive HA and photophobia.   10-point ROS otherwise negative.  ____________________________________________   PHYSICAL EXAM:  VITAL SIGNS: ED Triage Vitals  Enc Vitals Group     BP 01/23/16 1604 129/88     Pulse Rate 01/23/16 1604 99     Resp 01/23/16 1604 16     Temp 01/23/16 1604 98.1 F (36.7 C)     Temp src --      SpO2 01/23/16 1604 100 %     Weight 01/23/16 1603 170 lb (77.1 kg)     Height 01/23/16 1603 5\' 8"  (1.727 m)     Pain Score 01/23/16 1603 10   Constitutional: Alert and oriented. Well appearing and in no acute distress. Eyes: Conjunctivae are normal.  Head: Atraumatic. Nose: No congestion/rhinnorhea. Mouth/Throat: Mucous membranes are moist.  Oropharynx non-erythematous. Neck: No stridor.  Cardiovascular: Normal rate, regular rhythm. Good peripheral circulation. Grossly normal heart sounds.   Respiratory: Normal respiratory effort.  No retractions. Lungs CTAB. Gastrointestinal: Soft and nontender. No distention.  Musculoskeletal: No lower extremity tenderness nor edema. No gross deformities of extremities. Neurologic:  Normal speech and language. No gross focal neurologic deficits are appreciated. Positive photophobia.  Skin:  Skin is warm, dry and intact. No rash  noted.  ____________________________________________  RADIOLOGY  Ct Head Wo Contrast  Result Date: 01/23/2016 CLINICAL DATA:  Severe headaches for 3 days EXAM: CT HEAD WITHOUT CONTRAST TECHNIQUE: Contiguous axial images were obtained from the base of the skull through the vertex without intravenous contrast. COMPARISON:  None. FINDINGS: Brain: No evidence of acute infarction, hemorrhage, hydrocephalus, extra-axial collection or mass lesion/mass effect. Vascular: No hyperdense vessel or unexpected calcification. Skull: Normal. Negative for fracture or focal lesion. Sinuses/Orbits: No acute finding. Other: None. IMPRESSION: No acute intracranial abnormality noted. Electronically Signed   By: Alcide CleverMark  Kelly M.D.   On: 01/23/2016 18:10    ____________________________________________   PROCEDURES  Procedure(s) performed:   Procedures  None ____________________________________________   INITIAL IMPRESSION / ASSESSMENT AND PLAN / ED COURSE  Pertinent labs & imaging results that were available during my care of the patient were reviewed by me and considered in my medical decision making (see chart for details).  Patient presents to the emergency room in for evaluation of 3 days of intractable migraine headache. He reports this is very atypical for him in terms of its length and severity but feels like his usual migraine headaches. He has no focal neurological deficits. No meningismus. Very low suspicion for intracranial bleed. The patient is very concerned about tumor however I have lower suspicion for this. After Dalton Kelly discussion we will obtain a CT scan of the head given this is a somewhat atypical presentation for him with symptoms worse in the morning. Plan for migraine medications and IV fluids. Will reassess frequently.  07:04 PM Patient symptoms not improved. He is resistant to receiving the traditional migraine medications. He refused phenergan, compazine, reglan, and has reported  allergy to imitrex. He has agreed to try phenergan and compazine at this time. CT head is negative.   After Dalton Kelly discussion agreed to try opiate medications. I discussed that this is not my preferred medication and discussed the possibility of rebound HA. Also discussed trying sphenopalatine block. The patient did respond well to morphine. Will discharge home with small amount of Percocet. He reports that he will follow up with Neurology in the coming week.   At this time, I do not feel there is any life-threatening condition present. I have reviewed and discussed all results (EKG, imaging, lab, urine as appropriate), exam findings with patient. I have reviewed nursing notes and appropriate previous records.  I feel the patient is safe to be discharged home without further emergent workup. Discussed usual and customary return precautions. Patient and family (if present) verbalize understanding and are comfortable with this plan.  Patient will follow-up with their primary care provider. If they do not have a primary care provider, information for follow-up has been provided to them. All questions have been answered.  ____________________________________________  FINAL CLINICAL IMPRESSION(S) / ED DIAGNOSES  Final diagnoses:  Migraine without aura and without status migrainosus, not intractable     MEDICATIONS GIVEN DURING THIS VISIT:  Medications  sodium chloride 0.9 % bolus 1,000 mL (0 mLs Intravenous Stopped 01/23/16 1822)  ketorolac (TORADOL) 30 MG/ML injection 30 mg (30 mg Intravenous  Given 01/23/16 1738)  promethazine (PHENERGAN) injection 12.5 mg (12.5 mg Intravenous Given 01/23/16 1927)  traMADol (ULTRAM) tablet 50 mg (50 mg Oral Given 01/23/16 1831)  prochlorperazine (COMPAZINE) tablet 10 mg (10 mg Oral Given 01/23/16 1909)  morphine 4 MG/ML injection 4 mg (4 mg Intravenous Given 01/23/16 2001)  diphenhydrAMINE (BENADRYL) injection 12.5 mg (12.5 mg Intravenous Given 01/23/16 2000)   oxyCODONE-acetaminophen (PERCOCET/ROXICET) 5-325 MG per tablet 1 tablet (1 tablet Oral Given 01/23/16 2037)     NEW OUTPATIENT MEDICATIONS STARTED DURING THIS VISIT:  Discharge Medication List as of 01/23/2016  8:38 PM    START taking these medications   Details  oxyCODONE-acetaminophen (PERCOCET/ROXICET) 5-325 MG tablet Take 1 tablet by mouth every 4 (four) hours as needed for severe pain., Starting Wed 01/23/2016, Print         Note:  This document was prepared using Dragon voice recognition software and may include unintentional dictation errors.  Alona Bene, MD Emergency Medicine   Maia Plan, MD 01/24/16 [phone]

## 2016-01-23 NOTE — ED Notes (Signed)
Patient transported to CT 

## 2016-01-23 NOTE — Discharge Instructions (Signed)

## 2016-01-24 ENCOUNTER — Encounter: Payer: Self-pay | Admitting: Physician Assistant

## 2016-02-07 ENCOUNTER — Ambulatory Visit (INDEPENDENT_AMBULATORY_CARE_PROVIDER_SITE_OTHER): Payer: 59 | Admitting: Physician Assistant

## 2016-02-07 DIAGNOSIS — F411 Generalized anxiety disorder: Secondary | ICD-10-CM

## 2016-02-07 DIAGNOSIS — G43109 Migraine with aura, not intractable, without status migrainosus: Secondary | ICD-10-CM

## 2016-02-07 MED ORDER — BUTALBITAL-APAP-CAFF-COD 50-325-40-30 MG PO CAPS: 1.0000 | ORAL_CAPSULE | ORAL | 0 refills | Status: DC | PRN

## 2016-02-07 MED ORDER — CLONAZEPAM 0.5 MG PO TABS: ORAL_TABLET | ORAL | 0 refills | Status: DC

## 2016-02-07 NOTE — Patient Instructions (Signed)
     IF you received an x-ray today, you will receive an invoice from [facility]. Please contact [facility] at [phone] with questions or concerns regarding your invoice.   IF you received labwork today, you will receive an invoice from [facility]. Please contact [facility] at [phone] with questions or concerns regarding your invoice.   Our billing staff will not be able to assist you with questions regarding bills from these companies.  You will be contacted with the lab results as soon as they are available. The fastest way to get your results is to activate your My Chart account. Instructions are located on the last page of this paperwork. If you have not heard from us regarding the results in 2 weeks, please contact this office.      

## 2016-02-07 NOTE — Progress Notes (Signed)
   Dalton KindsWilliam F Sackmann  MRN: [id number] DOB: 06-04-1980  Subjective:  Pt presents to clinic for medication refill.  He has been doing good - he feels like his mood is more stable on the Cymbalta 40mg  - he has not tried to reduce his klonopin - he did try to reduce his Fioricet but was not successful and ended up in the ED with a severe migraine.  He [place] a trip planned to [place] in [place] and looking forward to that.  Dr Glenna FellowsKay [place] to hep with headaches  Review of Systems  Psychiatric/Behavioral: Positive for dysphoric mood. Negative for self-injury and sleep disturbance. The patient is nervous/anxious (improved).     Patient Active Problem List   Diagnosis Date Noted  . Low back pain 12/01/2015  . 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 Generalized anxiety disorder 06/24/2011  . Migraine 06/24/2011  . Insomnia 06/24/2011    Current Outpatient Prescriptions on File Prior to Visit  Medication Sig Dispense Refill  . cyclobenzaprine (FLEXERIL) 10 MG tablet Take by mouth.    . DULoxetine (CYMBALTA) 20 MG capsule Take 2 capsules (40 mg total) by mouth daily. 180 capsule 0  . HYDROcodone-acetaminophen (NORCO/VICODIN) 5-325 MG tablet 1 at night    . traZODone (DESYREL) 150 MG tablet 1/2 at bedtime 45 tablet 0   No current facility-administered medications on file prior to visit.     Allergies  Allergen Reactions  . Ibuprofen     Abdominal pain    Pt patients past, family and social history were reviewed and updated.   Objective:  BP 104/66 (BP Location: Right Arm, Patient Position: Sitting, Cuff Size: Small)   Pulse 94   Resp 18   Ht 5\' 8"  (1.727 m)   Wt 185 lb (83.9 kg)   SpO2 100%   BMI 28.13 kg/m   Physical Exam  Constitutional: He is oriented to person, place, and time and well-developed, well-nourished, and in no distress.  HENT:  Head: Normocephalic and atraumatic.  Right Ear: External ear normal.  Left Ear: External ear normal.    Eyes: Conjunctivae are normal.  Neck: Normal range of motion.  Pulmonary/Chest: Effort normal.  Neurological: He is alert and oriented to person, place, and time. Gait normal.  Skin: Skin is warm and dry.  Psychiatric: Mood, memory, affect and judgment normal.    Assessment and Plan :  GAD (generalized anxiety disorder) - Plan: clonazePAM (KLONOPIN) 0.5 MG tablet  Migraine with aura and without status migrainosus, not intractable - Plan: butalbital-acetaminophen-caffeine (FIORICET WITH CODEINE) 50-325-40-30 MG capsule   Continue current medications - he will try to increase his Cymbalta to 60mg  once he returns from vacation.  I hope his visit with the neurologist gives him some relief from his headaches as I know they are negatively affecting his life.  His mood is better even with a lot of stress at work.  Ok to refill medications for 3 months -   Benny LennertSarah Weber PA-C  [facility] 02/07/2016 12:39 PM

## 2016-02-13 ENCOUNTER — Encounter: Payer: Self-pay | Admitting: Physician Assistant

## 2016-02-23 ENCOUNTER — Encounter: Payer: Self-pay | Admitting: Physician Assistant

## 2016-03-03 ENCOUNTER — Emergency Department (HOSPITAL_BASED_OUTPATIENT_CLINIC_OR_DEPARTMENT_OTHER)
Admission: EM | Admit: 2016-03-03 | Discharge: 2016-03-03 | Disposition: A | Discharge status: Home / Self Care | Payer: 59 | Attending: Emergency Medicine | Admitting: Emergency Medicine

## 2016-03-03 ENCOUNTER — Ambulatory Visit: Payer: 59

## 2016-03-03 ENCOUNTER — Encounter (HOSPITAL_BASED_OUTPATIENT_CLINIC_OR_DEPARTMENT_OTHER): Payer: Self-pay

## 2016-03-03 DIAGNOSIS — Z79899 Other long term (current) drug therapy: Secondary | ICD-10-CM | POA: Diagnosis not present

## 2016-03-03 DIAGNOSIS — R197 Diarrhea, unspecified: Secondary | ICD-10-CM | POA: Insufficient documentation

## 2016-03-03 DIAGNOSIS — A09 Infectious gastroenteritis and colitis, unspecified: Secondary | ICD-10-CM

## 2016-03-03 DIAGNOSIS — Z87891 Personal history of nicotine dependence: Secondary | ICD-10-CM | POA: Diagnosis not present

## 2016-03-03 DIAGNOSIS — E860 Dehydration: Secondary | ICD-10-CM | POA: Diagnosis not present

## 2016-03-03 LAB — COMPREHENSIVE METABOLIC PANEL
ALBUMIN: 4 g/dL (ref 3.5–5.0)
ALT: 108 U/L — AB (ref 17–63)
AST: 54 U/L — AB (ref 15–41)
Alkaline Phosphatase: 174 U/L — ABNORMAL HIGH (ref 38–126)
Anion gap: 7 (ref 5–15)
BILIRUBIN TOTAL: 0.5 mg/dL (ref 0.3–1.2)
BUN: 8 mg/dL (ref 6–20)
CALCIUM: 8.9 mg/dL (ref 8.9–10.3)
CO2: 26 mmol/L (ref 22–32)
CREATININE: 0.78 mg/dL (ref 0.61–1.24)
Chloride: 103 mmol/L (ref 101–111)
GFR calc Af Amer: >60 mL/min (ref >60)
GFR calc non Af Amer: >60 mL/min (ref >60)
GLUCOSE: 108 mg/dL — AB (ref 65–99)
Potassium: 4 mmol/L (ref 3.5–5.1)
SODIUM: 136 mmol/L (ref 135–145)
Total Protein: 7 g/dL (ref 6.5–8.1)

## 2016-03-03 LAB — CBC WITH DIFFERENTIAL/PLATELET
BASOS PCT: 0 %
Basophils Absolute: 0 10*3/uL (ref 0.0–0.1)
EOS ABS: 0.2 10*3/uL (ref 0.0–0.7)
Eosinophils Relative: 1 %
HEMATOCRIT: 41 % (ref 39.0–52.0)
HEMOGLOBIN: 14.3 g/dL (ref 13.0–17.0)
Lymphocytes Relative: 4 %
Lymphs Abs: 0.7 10*3/uL (ref 0.7–4.0)
MCH: 31.8 pg (ref 26.0–34.0)
MCHC: 34.9 g/dL (ref 30.0–36.0)
MCV: 91.3 fL (ref 78.0–100.0)
Monocytes Absolute: 1.2 10*3/uL — ABNORMAL HIGH (ref 0.1–1.0)
Monocytes Relative: 6 %
NEUTROS ABS: 17.3 10*3/uL — AB (ref 1.7–7.7)
NEUTROS PCT: 89 %
Platelets: 255 10*3/uL (ref 150–400)
RBC: 4.49 MIL/uL (ref 4.22–5.81)
RDW: 11.7 % (ref 11.5–15.5)
WBC: 19.3 10*3/uL — AB (ref 4.0–10.5)

## 2016-03-03 LAB — LIPASE, BLOOD: Lipase: 32 U/L (ref 11–51)

## 2016-03-03 MED ORDER — DICYCLOMINE HCL 20 MG PO TABS: 20.0000 mg | ORAL_TABLET | Freq: Two times a day (BID) | ORAL | 0 refills | Status: DC

## 2016-03-03 MED ORDER — CIPROFLOXACIN HCL 500 MG PO TABS: 500.0000 mg | ORAL_TABLET | Freq: Two times a day (BID) | ORAL | 0 refills | Status: DC

## 2016-03-03 MED ORDER — METRONIDAZOLE 500 MG PO TABS: 500.0000 mg | ORAL_TABLET | Freq: Two times a day (BID) | ORAL | 0 refills | Status: DC

## 2016-03-03 MED ORDER — SODIUM CHLORIDE 0.9 % IV BOLUS (SEPSIS)
1000.0000 mL | Freq: Once | INTRAVENOUS | Status: AC
  Administered 2016-03-03: 1000 mL via INTRAVENOUS

## 2016-03-03 MED ORDER — ONDANSETRON 4 MG PO TBDP: 4.0000 mg | ORAL_TABLET | Freq: Three times a day (TID) | ORAL | 0 refills | Status: DC | PRN

## 2016-03-03 MED ORDER — ACETAMINOPHEN 325 MG PO TABS
650.0000 mg | ORAL_TABLET | Freq: Once | ORAL | Status: AC
  Administered 2016-03-03: 650 mg via ORAL
  Filled 2016-03-03: qty 2

## 2016-03-03 MED ORDER — ONDANSETRON HCL 4 MG/2ML IJ SOLN
4.0000 mg | Freq: Once | INTRAMUSCULAR | Status: AC
  Administered 2016-03-03: 4 mg via INTRAVENOUS
  Filled 2016-03-03: qty 2

## 2016-03-03 MED ORDER — DIPHENOXYLATE-ATROPINE 2.5-0.025 MG PO TABS: 1.0000 | ORAL_TABLET | ORAL | Status: DC

## 2016-03-03 MED FILL — DICYCLOMINE 20 MG TABLET: 20 | 10 days supply | Qty: 20 | Fill #0

## 2016-03-03 MED FILL — metroNIDAZOLE 500 MG TABS: 500 | 10 days supply | Qty: 20 | Fill #0

## 2016-03-03 MED FILL — CIPROFLOXACIN HCL 500 MG TA: 500 | 10 days supply | Qty: 20 | Fill #0

## 2016-03-03 MED FILL — ONDANSETRON ODT 4 MG TABLET: 4 | 3 days supply | Qty: 10 | Fill #0

## 2016-03-03 NOTE — ED Notes (Signed)
Pt requesting to speak to MD. Dr. Ethelda ChickJacubowitz in to speak to patient prior to d/c

## 2016-03-03 NOTE — ED Notes (Signed)
Pt up to BR but unable to obtain stool sample

## 2016-03-03 NOTE — ED Notes (Signed)
Called to lobby by registration to assist pt to wheelchair. Registration staff reports while checking pt in he went from the chair to his knees then placed himself on his side on the floor. Pt was responsive and alert when assisted to stand and get into wheelchair.

## 2016-03-03 NOTE — ED Notes (Addendum)
Upon starting IV pt c/o bp being to tight and having to have it on, pt also c/o tourniquet for iv is too tight, also c/o unable to walk to BR if he has to , d/t he's "going to pass out". Pt refused to keep lights on , partner in room continues to turnoff lights instructed them I need light on to start iv. Pt c/o iv "hurting" instructed pt that there was no needle and it was just a piece of plastic. Iv infusing unremarkably

## 2016-03-03 NOTE — ED Notes (Signed)
I went into the pts room with his DC papers to discharge him and he said that he wanted to see the Misty StanleyLisa (the PA) again. I told him that she had left for the day.  He said he wanted to see a doctor.  The EMT asked him to let me know what he wanted because I am a nurse and perhaps I could help him.  Pt said "Oh no.  I have had her before and it was not a pleasant experience.  I don't want to deal with her."  I said, "OK, I will let the doctor know" and I left the room. Press photographerCharge nurse and edp notified.

## 2016-03-03 NOTE — ED Provider Notes (Signed)
[facility] Provider Note   CSN: [id number] Arrival date & time: 03/03/16  1245     History   Chief Complaint Chief Complaint  Patient presents with  . Diarrhea    HPI KEVIS QU is a 36 y.o. male.  The history is provided by medical records.  Diarrhea   Associated symptoms include myalgias.    36 year old male with history of anxiety, recurrent syncopal episodes, history of irregular bowel habits, presenting to the ED for diarrhea. Patient reports this is been ongoing for the past 2 weeks. Episodes have been watery with some mucus, but no solid, formed stool. He denies any melena or hematochezia. Does have some lower abdominal cramping but no severe pain.  States he recently returned home from [place] where he was on amoxicillin for strep throat/sinus issues.  States he has taken that before and never had issues in the past.  States he was prescribed some sort of cough medication there that which she was taking and when he looked of the ingredients online, one component was shown to cause diarrhea. He states he stopped taking it several days ago, however her diarrhea persists. Has been taking Imodium daily without any relief.   Does report low-grade fever and body aches at home.No nausea or vomiting. States he is able to eat and drink, however he feels that everything "runs through him". States he was feeling faint earlier today while trying to do laundry.  Low grade fever on arrival.  Past Medical History:  Diagnosis Date  . Anxiety   . Back pain   . Episode of syncope    Recent that sounds vasovagal  . Fatigue   . Generalized anxiety disorder   . Hair loss   . Insomnia   . Irregular bowel habits   . Systolic click     Patient Active Problem List   Diagnosis Date Noted  . Low back pain 12/01/2015  . Systolic click 53/66/4403  . Generalized anxiety disorder 06/24/2011  . Migraine 06/24/2011  . Insomnia 06/24/2011    Past Surgical History:    Procedure Laterality Date  . WISDOM TOOTH EXTRACTION         Home Medications    Prior to Admission medications   Medication Sig Start Date End Date Taking? Authorizing Provider  butalbital-acetaminophen-caffeine (FIORICET WITH CODEINE) [phone] MG capsule Take 1 capsule by mouth every 4 (four) hours as needed for headache. 02/07/16   Mancel Bale, PA-C  clonazePAM (KLONOPIN) 0.5 MG tablet 1 am, 1 midday and 2 hs 02/07/16   Mancel Bale, PA-C  cyclobenzaprine (FLEXERIL) 10 MG tablet Take by mouth. 10/25/15   Historical Provider, MD  DULoxetine (CYMBALTA) 20 MG capsule Take 2 capsules (40 mg total) by mouth daily. 12/15/15   Mancel Bale, PA-C  HYDROcodone-acetaminophen (NORCO/VICODIN) 5-325 MG tablet 1 at night 09/03/15   Historical Provider, MD  traZODone (DESYREL) 150 MG tablet 1/2 at bedtime 11/17/15   Mancel Bale, PA-C    Family History Family History  Problem Relation Age of Onset  . Cancer Mother   . Hypertension Father   . Stroke Maternal Grandmother   . Alzheimer's disease Maternal Grandmother   . Cancer Maternal Grandfather   . Diabetes Maternal Grandfather   . Cancer Paternal Grandmother   . Heart failure Paternal Grandfather     Social History Social History  Substance Use Topics  . Smoking status: Former Research scientist (life sciences)  . Smokeless tobacco: Never Used  . Alcohol use No  Allergies   Ibuprofen and Nsaids   Review of Systems Review of Systems  Gastrointestinal: Positive for diarrhea.  Musculoskeletal: Positive for myalgias.  All other systems reviewed and are negative.    Physical Exam Updated Vital Signs BP 125/87 (BP Location: Left Arm)   Pulse 120   Temp 100.4 F (38 C) (Oral)   Resp 18   Ht '5\' 8"'  (1.727 m)   Wt 77.1 kg   SpO2 100%   BMI 25.85 kg/m   Physical Exam  Constitutional: He is oriented to person, place, and time. He appears well-developed and well-nourished.  No acute distress, nontoxic in appearance  HENT:  Head:  Normocephalic and atraumatic.  Mouth/Throat: Oropharynx is clear and moist.  Mildly dry mucous membranes  Eyes: Conjunctivae and EOM are normal. Pupils are equal, round, and reactive to light.  Neck: Normal range of motion.  Cardiovascular: Normal rate, regular rhythm and normal heart sounds.   Pulmonary/Chest: Effort normal and breath sounds normal. No respiratory distress. He has no wheezes.  Abdominal: Soft. Bowel sounds are normal. There is no tenderness. There is no rebound.  Abdomen soft, nondistended, no focal tenderness or peritoneal signs  Musculoskeletal: Normal range of motion.  Neurological: He is alert and oriented to person, place, and time.  Skin: Skin is warm and dry.  Psychiatric: He has a normal mood and affect.  Nursing note and vitals reviewed.    ED Treatments / Results  Labs (all labs ordered are listed, but only abnormal results are displayed) Labs Reviewed  CBC WITH DIFFERENTIAL/PLATELET - Abnormal; Notable for the following:       Result Value   WBC 19.3 (*)    Neutro Abs 17.3 (*)    Monocytes Absolute 1.2 (*)    All other components within normal limits  COMPREHENSIVE METABOLIC PANEL - Abnormal; Notable for the following:    Glucose, Bld 108 (*)    AST 54 (*)    ALT 108 (*)    Alkaline Phosphatase 174 (*)    All other components within normal limits  C DIFFICILE QUICK SCREEN W PCR REFLEX  GASTROINTESTINAL PANEL BY PCR, STOOL (REPLACES STOOL CULTURE)  LIPASE, BLOOD    EKG  EKG Interpretation None       Radiology No results found.  Procedures Procedures (including critical care time)  Medications Ordered in ED Medications  sodium chloride 0.9 % bolus 1,000 mL (1,000 mLs Intravenous New Bag/Given 03/03/16 1402)  ondansetron (ZOFRAN) injection 4 mg (4 mg Intravenous Given 03/03/16 1402)  acetaminophen (TYLENOL) tablet 650 mg (650 mg Oral Given 03/03/16 1402)     Initial Impression / Assessment and Plan / ED Course  I have reviewed the  triage vital signs and the nursing notes.  Pertinent labs & imaging results that were available during my care of the patient were reviewed by me and considered in my medical decision making (see chart for details).  Clinical Course    36 year old male here with diarrhea for the past 2 weeks. Recently traveled to [place] was taking amoxicillin for sore throat/sinusitis. Here he has a low-grade fever and is nontoxic in appearance. His abdomen is soft and benign.. His vitals are stable.  Patient's white blood cell count is elevated at 19K. He also has some mild elevations of his LFTs and alkaline phosphatase.  Patient has no upper abdominal pain or tenderness on exam. He has no nausea/vomiting and symptoms are not clinically consistent with cholecystitis or other biliary process.  Patient has received  fluids here, feeling somewhat better.  Has been able to tolerate oral medications, however he has not been unable to provide stool sample here in the emergency department. Given his leukocytosis, fever, and continued diarrhea, he likely has some form of infectious colitis. Considered c.diff given his recent abx use. Given that his abdominal exam remains benign, do not feel he necessarily needs CT scan at this time. Will start  on Cipro/Flagyl.  Will also give Zofran and Bentyl for symptom control.  Encouraged to continue drinking fluids to stay hydrated, gentle diet for now.  Recommended close follow-up with PCP later this week for re-check-- given stool sample kit for home that he may take to his PCP for analysis since he could not provide sample here.  Discussed plan with patient, he/she acknowledged understanding and agreed with plan of care.  Return precautions given for new or worsening symptoms.   Final Clinical Impressions(s) / ED Diagnoses   Final diagnoses:  Diarrhea of infectious origin    New Prescriptions New Prescriptions   CIPROFLOXACIN (CIPRO) 500 MG TABLET    Take 1 tablet (500 mg  total) by mouth every 12 (twelve) hours.   DICYCLOMINE (BENTYL) 20 MG TABLET    Take 1 tablet (20 mg total) by mouth 2 (two) times daily.   METRONIDAZOLE (FLAGYL) 500 MG TABLET    Take 1 tablet (500 mg total) by mouth 2 (two) times daily.   ONDANSETRON (ZOFRAN ODT) 4 MG DISINTEGRATING TABLET    Take 1 tablet (4 mg total) by mouth every 8 (eight) hours as needed for nausea.     Larene Pickett, PA-C 03/03/16 [place], MD 03/04/16 2259

## 2016-03-03 NOTE — Discharge Instructions (Signed)
Take the prescribed medication as directed.  Continue to drink fluids to stay hydrated. May complete stool sample at home and take to your primary care doctor for analysis Follow-up with your doctor later this week for re-check. Return to the ED for new or worsening symptoms.

## 2016-03-03 NOTE — ED Provider Notes (Signed)
Patient briefly seen by me after he was discharged. He was asking about nasal congestion. I suggested Mucinex and/or saline nasal spray. I also suggested that it may take several days more until congestion does not resolve. 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 Patient is alert nontoxic, not ill-appearing   Doug SouSam Analaura Messler, MD 03/03/16 [phone]

## 2016-03-03 NOTE — ED Notes (Signed)
Marion DownerScott Bennett, RN, AD in to give patient his discharge papers

## 2016-03-03 NOTE — ED Triage Notes (Signed)
C/o diarrhea x approx 2 weeks after starting abx and cough med for sore throat-presents to triage in w/c-NAD

## 2016-03-03 NOTE — ED Notes (Signed)
Sharilyn SitesLisa Sanders, PA, ED Provider at bedside.

## 2016-03-03 NOTE — ED Notes (Signed)
Pt directed to pharmacy to pick up Rx. Ride present at bedside. Security officer Ladona Ridgelaylor with pt to assist with wheelchair

## 2016-03-03 NOTE — ED Notes (Signed)
ED Provider at bedside. 

## 2016-03-04 ENCOUNTER — Telehealth: Payer: Self-pay

## 2016-03-04 ENCOUNTER — Encounter: Payer: Self-pay | Admitting: Physician Assistant

## 2016-03-04 LAB — GASTROINTESTINAL PANEL BY PCR, STOOL (REPLACES STOOL CULTURE)

## 2016-03-04 NOTE — Telephone Encounter (Signed)
Pt calling to see if Dalton LennertSarah Weber would be willing to prescribe Zofran. He has a limited supply from the ED but is concerned that this will not last. Please advise.

## 2016-03-05 ENCOUNTER — Other Ambulatory Visit: Payer: Self-pay | Admitting: Physician Assistant

## 2016-03-05 MED ORDER — ONDANSETRON 4 MG PO TBDP: 4.0000 mg | ORAL_TABLET | Freq: Three times a day (TID) | ORAL | 0 refills | Status: DC | PRN

## 2016-03-05 NOTE — Telephone Encounter (Signed)
See 03/03/16 ed visit and advise please, given 10 tabs...follow up?

## 2016-03-05 NOTE — Telephone Encounter (Signed)
Done

## 2016-03-05 NOTE — Telephone Encounter (Signed)
Patient is calling back to check to make sure Maralyn SagoSarah got his message and to see if she would give him some Zofran. States he is still feeling really bad.--advised patient that may need to come in for OV if not better.  He would like someone to give him a call at [phone].

## 2016-03-06 ENCOUNTER — Encounter: Payer: Self-pay | Admitting: Physician Assistant

## 2016-03-06 DIAGNOSIS — F411 Generalized anxiety disorder: Secondary | ICD-10-CM

## 2016-03-06 DIAGNOSIS — G43109 Migraine with aura, not intractable, without status migrainosus: Secondary | ICD-10-CM

## 2016-03-07 ENCOUNTER — Telehealth: Payer: Self-pay | Admitting: *Deleted

## 2016-03-07 DIAGNOSIS — F411 Generalized anxiety disorder: Secondary | ICD-10-CM

## 2016-03-07 MED ORDER — BUTALBITAL-APAP-CAFF-COD 50-325-40-30 MG PO CAPS: 1.0000 | ORAL_CAPSULE | ORAL | 0 refills | Status: DC | PRN

## 2016-03-07 MED ORDER — CLONAZEPAM 0.5 MG PO TABS: ORAL_TABLET | ORAL | 0 refills | Status: DC

## 2016-03-07 MED ORDER — DULOXETINE HCL 60 MG PO CPEP: 60.0000 mg | ORAL_CAPSULE | Freq: Every day | ORAL | 0 refills | Status: DC

## 2016-03-07 NOTE — Telephone Encounter (Signed)
Ready - pt knows through mychart. 

## 2016-03-07 NOTE — Telephone Encounter (Signed)
Patient called stating he needs a refill on his cymbalta and it needs to be 60mg , patient is also requesting his klonopin, fiorcet with codine for his headaches. Please call patient once these are filled. Please advise [phone]

## 2016-03-09 NOTE — Telephone Encounter (Signed)
This had already been done on 1/12 - this can be seen in the meds section of the chart

## 2016-03-09 NOTE — Telephone Encounter (Signed)
Klonopin and fioricet printed?

## 2016-03-10 NOTE — Telephone Encounter (Signed)
Book signed as picked up 03/07/16

## 2016-03-12 ENCOUNTER — Encounter: Payer: Self-pay | Admitting: Physician Assistant

## 2016-03-12 DIAGNOSIS — R197 Diarrhea, unspecified: Secondary | ICD-10-CM

## 2016-03-25 DIAGNOSIS — M47812 Spondylosis without myelopathy or radiculopathy, cervical region: Secondary | ICD-10-CM | POA: Diagnosis not present

## 2016-03-25 DIAGNOSIS — M542 Cervicalgia: Secondary | ICD-10-CM | POA: Diagnosis not present

## 2016-03-25 DIAGNOSIS — G43909 Migraine, unspecified, not intractable, without status migrainosus: Secondary | ICD-10-CM | POA: Diagnosis not present

## 2016-03-28 ENCOUNTER — Ambulatory Visit (INDEPENDENT_AMBULATORY_CARE_PROVIDER_SITE_OTHER): Payer: 59 | Admitting: Physician Assistant

## 2016-03-28 VITALS — BP 128/80 | HR 81 | Temp 98.4°F | Resp 17 | Ht 67.5 in | Wt 172.0 lb

## 2016-03-28 DIAGNOSIS — G4700 Insomnia, unspecified: Secondary | ICD-10-CM

## 2016-03-28 DIAGNOSIS — G43109 Migraine with aura, not intractable, without status migrainosus: Secondary | ICD-10-CM

## 2016-03-28 DIAGNOSIS — F411 Generalized anxiety disorder: Secondary | ICD-10-CM | POA: Diagnosis not present

## 2016-03-28 MED ORDER — BUTALBITAL-APAP-CAFF-COD 50-325-40-30 MG PO CAPS: 1.0000 | ORAL_CAPSULE | ORAL | 2 refills | Status: DC | PRN

## 2016-03-28 MED ORDER — CLONAZEPAM 0.5 MG PO TABS: ORAL_TABLET | ORAL | 0 refills | Status: DC

## 2016-03-28 MED ORDER — DULOXETINE HCL 60 MG PO CPEP: 60.0000 mg | ORAL_CAPSULE | Freq: Every day | ORAL | 0 refills | Status: DC

## 2016-03-28 MED ORDER — BUSPIRONE HCL 7.5 MG PO TABS: 7.5000 mg | ORAL_TABLET | Freq: Two times a day (BID) | ORAL | 0 refills | Status: DC

## 2016-03-28 NOTE — Patient Instructions (Signed)
     IF you received an x-ray today, you will receive an invoice from [facility]. Please contact [facility] at [phone] with questions or concerns regarding your invoice.   IF you received labwork today, you will receive an invoice from [facility]. Please contact [facility] at [phone] with questions or concerns regarding your invoice.   Our billing staff will not be able to assist you with questions regarding bills from these companies.  You will be contacted with the lab results as soon as they are available. The fastest way to get your results is to activate your My Chart account. Instructions are located on the last page of this paperwork. If you have not heard from us regarding the results in 2 weeks, please contact this office.     

## 2016-03-28 NOTE — Progress Notes (Signed)
   Dalton KindsWilliam F Kelly  MRN: [id number] DOB: November 24, 1980  Subjective:  Pt presents to clinic for medication refills.  He is doing well.    Mood is improved with the cymbalta - she is tolerating the cymbalta 60mg  well.  He has not noticed an improvement in his anxiety but he is interested in starting buspar.  Most of his anxiety is in the am and work related and the time of day with the least anxiety is in the afternoon.  Headaches are not much better - he is seeing a specialist and has an a normal MRI - he is looking into Botox and maybe chiropractic care.   He has noticed a decrease in his back pain also with the cymbalta.  Review of Systems  Patient Active Problem List   Diagnosis Date Noted  . Low back pain 12/01/2015  . 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 Generalized anxiety disorder 06/24/2011  . Migraine 06/24/2011  . Insomnia 06/24/2011    Current Outpatient Prescriptions on File Prior to Visit  Medication Sig Dispense Refill  . cyclobenzaprine (FLEXERIL) 10 MG tablet Take by mouth.    Marland Kitchen. HYDROcodone-acetaminophen (NORCO/VICODIN) 5-325 MG tablet 1 at night    . traZODone (DESYREL) 150 MG tablet 1/2 at bedtime 45 tablet 0   No current facility-administered medications on file prior to visit.     Allergies  Allergen Reactions  . Ibuprofen     Abdominal pain  . Nsaids Nausea And Vomiting    Pt patients past, family and social history were reviewed and updated.   Objective:  BP 128/80 (BP Location: Right Arm, Patient Position: Sitting, Cuff Size: Normal)   Pulse 81   Temp 98.4 F (36.9 C) (Oral)   Resp 17   Ht 5' 7.5" (1.715 m)   Wt 172 lb (78 kg)   SpO2 97%   BMI 26.54 kg/m   Physical Exam  Constitutional: He is oriented to person, place, and time and well-developed, well-nourished, and in no distress.  HENT:  Head: Normocephalic and atraumatic.  Right Ear: External ear normal.  Left Ear: External ear normal.  Eyes: Conjunctivae are normal.  Neck: Normal range of  motion.  Pulmonary/Chest: Effort normal.  Neurological: He is alert and oriented to person, place, and time. Gait normal.  Skin: Skin is warm and dry.  Psychiatric: Mood, memory, affect and judgment normal.    Assessment and Plan :  Generalized anxiety disorder  Migraine with aura and without status migrainosus, not intractable - Plan: butalbital-acetaminophen-caffeine (FIORICET WITH CODEINE) 50-325-40-30 MG capsule  GAD (generalized anxiety disorder) - Plan: busPIRone (BUSPAR) 7.5 MG tablet, clonazePAM (KLONOPIN) 0.5 MG tablet, DULoxetine (CYMBALTA) 60 MG capsule - continue with Cymbalta 60mg  - we will likely need to increase this in the future but we will try buspar and adjust up to see if we can decrease his need for klonopin - he is now interested in decreasing the dose  Insomnia, unspecified type - continue trazodone  Benny LennertSarah Weber PA-C  Primary [facility] at [facility] 03/31/2016 11:03 AM

## 2016-03-31 DIAGNOSIS — M545 Low back pain: Secondary | ICD-10-CM | POA: Diagnosis not present

## 2016-04-01 DIAGNOSIS — G43719 Chronic migraine without aura, intractable, without status migrainosus: Secondary | ICD-10-CM | POA: Diagnosis not present

## 2016-04-08 ENCOUNTER — Encounter: Payer: Self-pay | Admitting: Physician Assistant

## 2016-04-08 ENCOUNTER — Ambulatory Visit (INDEPENDENT_AMBULATORY_CARE_PROVIDER_SITE_OTHER): Payer: 59 | Admitting: Family Medicine

## 2016-04-08 VITALS — BP 118/80 | HR 89 | Temp 98.3°F | Resp 16 | Ht 67.5 in | Wt 171.0 lb

## 2016-04-08 DIAGNOSIS — R6889 Other general symptoms and signs: Secondary | ICD-10-CM | POA: Diagnosis not present

## 2016-04-08 DIAGNOSIS — R69 Illness, unspecified: Secondary | ICD-10-CM

## 2016-04-08 DIAGNOSIS — H1032 Unspecified acute conjunctivitis, left eye: Secondary | ICD-10-CM

## 2016-04-08 DIAGNOSIS — R05 Cough: Secondary | ICD-10-CM | POA: Diagnosis not present

## 2016-04-08 DIAGNOSIS — J111 Influenza due to unidentified influenza virus with other respiratory manifestations: Secondary | ICD-10-CM

## 2016-04-08 DIAGNOSIS — R059 Cough, unspecified: Secondary | ICD-10-CM

## 2016-04-08 LAB — POCT INFLUENZA A/B
INFLUENZA B, POC: NEGATIVE
Influenza A, POC: NEGATIVE

## 2016-04-08 MED ORDER — OSELTAMIVIR PHOSPHATE 75 MG PO CAPS: 75.0000 mg | ORAL_CAPSULE | Freq: Two times a day (BID) | ORAL | 0 refills | Status: DC

## 2016-04-08 MED ORDER — POLYMYXIN B-TRIMETHOPRIM 10000-0.1 UNIT/ML-% OP SOLN: 1.0000 [drp] | Freq: Four times a day (QID) | OPHTHALMIC | 0 refills | Status: DC

## 2016-04-08 MED ORDER — HYDROCODONE-HOMATROPINE 5-1.5 MG/5ML PO SYRP: ORAL_SOLUTION | ORAL | 0 refills | Status: DC

## 2016-04-08 NOTE — Progress Notes (Deleted)
F

## 2016-04-08 NOTE — Patient Instructions (Addendum)
Tamiflu was sent to your pharmacy. Saline nasal spray at least 4 times per day if needed for nasal congestion, over the counter mucinex or mucinex DM as needed for cough, hydrocodone cough syrup at night if needed. tylenol over the counter for fever and body aches, and drink plenty of fluids. Other information as in instructions below.  Return to the clinic or go to the nearest emergency room if any of your symptoms worsen or new symptoms occur. Out of work for 24 hours after you have been free of fever, and that includes off a fever reducing medications.  Your left eye symptoms appear to be due to a viral conjunctivitis at this time, but if the discharge is not improving tomorrow, you can start the antibiotic drops.  Return to the clinic or go to the nearest emergency room if any of your symptoms worsen or new symptoms occur.   Influenza, Adult Influenza, more commonly known as "the flu," is a viral infection that primarily affects the respiratory tract. The respiratory tract includes organs that help you breathe, such as the lungs, nose, and throat. The flu causes many common cold symptoms, as well as a high fever and body aches. The flu spreads easily from person to person (is contagious). Getting a flu shot (influenza vaccination) every year is the best way to prevent influenza. What are the causes? Influenza is caused by a virus. You can catch the virus by:  Breathing in droplets from an infected person's cough or sneeze.  Touching something that was recently contaminated with the virus and then touching your mouth, nose, or eyes. What increases the risk? The following factors may make you more likely to get the flu:  Not cleaning your hands frequently with soap and water or alcohol-based hand sanitizer.  Having close contact with many people during cold and flu season.  Touching your mouth, eyes, or nose without washing or sanitizing your hands first.  Not drinking enough fluids or  not eating a healthy diet.  Not getting enough sleep or exercise.  Being under a high amount of stress.  Not getting a yearly (annual) flu shot. You may be at a higher risk of complications from the flu, such as a severe lung infection (pneumonia), if you:  Are over the age of 28.  Are pregnant.  Have a weakened disease-fighting system (immune system). You may have a weakened immune system if you:  Have HIV or AIDS.  Are undergoing chemotherapy.  Aretaking medicines that reduce the activity of (suppress) the immune system.  Have a long-term (chronic) illness, such as heart disease, kidney disease, diabetes, or lung disease.  Have a liver disorder.  Are obese.  Have anemia. What are the signs or symptoms? Symptoms of this condition typically last 4-10 days and may include:  Fever.  Chills.  Headache, body aches, or muscle aches.  Sore throat.  Cough.  Runny or congested nose.  Chest discomfort and cough.  Poor appetite.  Weakness or tiredness (fatigue).  Dizziness.  Nausea or vomiting. How is this diagnosed? This condition may be diagnosed based on your medical history and a physical exam. Your health care provider may do a nose or throat swab test to confirm the diagnosis. How is this treated? If influenza is detected early, you can be treated with antiviral medicine that can reduce the length of your illness and the severity of your symptoms. This medicine may be given by mouth (orally) or through an IV tube that is inserted  in one of your veins. The goal of treatment is to relieve symptoms by taking care of yourself at home. This may include taking over-the-counter medicines, drinking plenty of fluids, and adding humidity to the air in your home. In some cases, influenza goes away on its own. Severe influenza or complications from influenza may be treated in a hospital. Follow these instructions at home:  Take over-the-counter and prescription medicines  only as told by your health care provider.  Use a cool mist humidifier to add humidity to the air in your home. This can make breathing easier.  Rest as needed.  Drink enough fluid to keep your urine clear or pale yellow.  Cover your mouth and nose when you cough or sneeze.  Wash your hands with soap and water often, especially after you cough or sneeze. If soap and water are not available, use hand sanitizer.  Stay home from work or school as told by your health care provider. Unless you are visiting your health care provider, try to avoid leaving home until your fever has been gone for 24 hours without the use of medicine.  Keep all follow-up visits as told by your health care provider. This is important. How is this prevented?  Getting an annual flu shot is the best way to avoid getting the flu. You may get the flu shot in late summer, fall, or winter. Ask your health care provider when you should get your flu shot.  Wash your hands often or use hand sanitizer often.  Avoid contact with people who are sick during cold and flu season.  Eat a healthy diet, drink plenty of fluids, get enough sleep, and exercise regularly. Contact a health care provider if:  You develop new symptoms.  You have:  Chest pain.  Diarrhea.  A fever.  Your cough gets worse.  You produce more mucus.  You feel nauseous or you vomit. Get help right away if:  You develop shortness of breath or difficulty breathing.  Your skin or nails turn a bluish color.  You have severe pain or stiffness in your neck.  You develop a sudden headache or sudden pain in your face or ear.  You cannot stop vomiting. This information is not intended to replace advice given to you by your health care provider. Make sure you discuss any questions you have with your health care provider. Document Released: 02/08/2000 Document Revised: 07/19/2015 Document Reviewed: 12/05/2014 Elsevier Interactive Patient Education   2017 [employer]    Viral Conjunctivitis, Adult Viral conjunctivitis is an inflammation of the clear membrane that covers the white part of your eye and the inner surface of your eyelid (conjunctiva). The inflammation is caused by a viral infection. The blood vessels in the conjunctiva become inflamed, causing the eye to become red or pink, and often itchy. Viral conjunctivitis can be easily passed from one person to another (is contagious). This condition is often called pink eye. What are the causes? This condition is caused by a virus. A virus is a type of contagious germ. It can be spread by touching objects that have been contaminated with the virus, such as doorknobs or towels. It can also be passed through droplets, such as from coughing or sneezing. What are the signs or symptoms? Symptoms of this condition include:  Eye redness.  Tearing or watery eyes.  Itchy and irritated eyes.  Burning feeling in the eyes.  Clear drainage from the eye.  Swollen eyelids.  A gritty feeling in  the eye.  Light sensitivity. This condition often occurs with other symptoms, such as a fever, nausea, or a rash. How is this diagnosed? This condition is diagnosed with a medical history and physical exam. If you have discharge from your eye, the discharge may be tested to rule out other causes of conjunctivitis. How is this treated? Viral conjunctivitis does not respond to medicines that kill bacteria (antibiotics). Treatment for viral conjunctivitis is directed at stopping a bacterial infection from developing in addition to the viral infection. Treatment also aims to relieve your symptoms, such as itching. This may be done with antihistamine drops or other eye medicines. Rarely, steroid eye drops or antiviral medicines may be prescribed. Follow these instructions at home: Medicines  Take or apply over-the-counter and prescription medicines only as told by your health care provider.  Be very  careful to avoid touching the edge of the eyelid with the eye drop bottle or ointment tube when applying medicines to the affected eye. Being careful this way will stop you from spreading the infection to the other eye or to other people. Eye care  Avoid touching or rubbing your eyes.  Apply a warm, wet, clean washcloth to your eye for 10-20 minutes, 3-4 times per day or as told by your health care provider.  If you wear contact lenses, do not wear them until the inflammation is gone and your health care provider says it is safe to wear them again. Ask your health care provider how to sterilize or replace your contact lenses before using them again. Wear glasses until you can resume wearing contacts.  Avoid wearing eye makeup until the inflammation is gone. Throw away any old eye cosmetics that may be contaminated.  Gently wipe away any drainage from your eye with a warm, wet washcloth or a cotton ball. General instructions  Change or wash your pillowcase every day or as told by your health care provider.  Do not share towels, pillowcases, washcloths, eye makeup, makeup brushes, contact lenses, or glasses. This may spread the infection.  Wash your hands often with soap and water. Use paper towels to dry your hands. If soap and water are not available, use hand sanitizer.  Try to avoid contact with other people for one week or as told by your health care provider. Contact a health care provider if:  Your symptoms do not improve with treatment or they get worse.  You have increased pain.  Your vision becomes blurry.  You have a fever.  You have facial pain, redness, or swelling.  You have yellow or green drainage coming from your eye.  You have new symptoms. This information is not intended to replace advice given to you by your health care provider. Make sure you discuss any questions you have with your health care provider. Document Released: 05/03/2002 Document Revised:  09/08/2015 Document Reviewed: 08/28/2015 Elsevier Interactive Patient Education  2017 [employer].     IF you received an x-ray today, you will receive an invoice from [facility]. Please contact [facility] at [phone] with questions or concerns regarding your invoice.   IF you received labwork today, you will receive an invoice from [facility]. Please contact [facility] at [phone] with questions or concerns regarding your invoice.   Our billing staff will not be able to assist you with questions regarding bills from these companies.  You will be contacted with the lab results as soon as they are available. The fastest way to get your results is to  activate your My Chart account. Instructions are located on the last page of this paperwork. If you have not heard from [place] regarding the results in 2 weeks, please contact this office.

## 2016-04-08 NOTE — Progress Notes (Signed)
By signing my name below, I, Mesha Guinyard, attest that this documentation has been prepared under the direction and in the presence of Meredith Staggers, MD.  Electronically Signed: Arvilla Market, Medical Scribe. 04/08/16. 4:32 PM.  Subjective:    Patient ID: Dalton Kelly, male    DOB: 20-Jul-1980, 36 y.o.   MRN: [id number]  HPI Chief Complaint  Patient presents with  . Conjunctivitis    Left eye  . Flu like symptoms    HPI Comments: Dalton Kelly is a 36 y.o. male who presents to the [facility] complaining of conjunctivitis and flu like sxs onset yesterday morning. Pt had myalgias, fatigue, dizziness while at work yesterday, and when he got home around 7 pm his sxs quickly developed into worsening fatigue, worsening cough, chest and nasal congestion, chills to the point of chattering teeth, measured fever of 101.2, and loss of sleep. Pt's husband had the flu, but pt has been trying to keep his distance. 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 Pt did not take any medication for his sxs. Denies PMHx of asthma. Denies ear pain or being immunocompromised, current light-headedness, or current dizziness.  Left Eye Conjunctivitis: Pt woke up to eye redness yesterday, this morning his eye was crusted to the point of difficulty opening and he had yellow discharge during the day. Pt installed a new door bell without safety goggles 2 days ago and he was drilling through brick, mortar, and wood - there was small bits flying in the air. He doesn't remember experiencing eye irritation during or after drilling. Denies blurry vision.   Visual Acuity Screening   Right eye Left eye Both eyes  Without correction: 20/13 20/13 20/13   With correction:      NSAIDs cause severe stomach upset.  Patient Active Problem List   Diagnosis Date Noted  . Low back pain 12/01/2015  . 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 Generalized anxiety disorder 06/24/2011  .  Migraine 06/24/2011  . Insomnia 06/24/2011   Past Medical History:  Diagnosis Date  . Anxiety   . Back pain   . Episode of syncope    Recent that sounds vasovagal  . Fatigue   . Generalized anxiety disorder   . Hair loss   . Insomnia   . Irregular bowel habits   . Systolic click    Past Surgical History:  Procedure Laterality Date  . WISDOM TOOTH EXTRACTION     Allergies  Allergen Reactions  . Ibuprofen     Abdominal pain  . Nsaids Nausea And Vomiting   Prior to Admission medications   Medication Sig Start Date End Date Taking? Authorizing Provider  busPIRone (BUSPAR) 7.5 MG tablet Take 1 tablet (7.5 mg total) by mouth 2 (two) times daily. 03/28/16  Yes Morrell Riddle, PA-C  butalbital-acetaminophen-caffeine (FIORICET WITH CODEINE) [phone] MG capsule Take 1 capsule by mouth every 4 (four) hours as needed for headache. 03/28/16  Yes Morrell Riddle, PA-C  clonazePAM (KLONOPIN) 0.5 MG tablet 1 am, 1 midday and 2 hs 03/28/16  Yes Morrell Riddle, PA-C  cyclobenzaprine (FLEXERIL) 10 MG tablet Take by mouth. 10/25/15  Yes Historical Provider, MD  DULoxetine (CYMBALTA) 60 MG capsule Take 1 capsule (60 mg total) by mouth daily. 03/28/16  Yes Morrell Riddle, PA-C  HYDROcodone-acetaminophen (NORCO/VICODIN) 5-325 MG tablet 1 at night 09/03/15  Yes Historical Provider, MD  traZODone (DESYREL) 150 MG tablet 1/2 at bedtime 11/17/15  Yes Morrell Riddle, PA-C   Social History   Social History  . Marital status: Married    Spouse name: [name]  . Number of children: N/A  . Years of education: N/A   Occupational History  . therapist     [employer]   Social History Main Topics  . Smoking status: Former Games developer  . Smokeless tobacco: Never Used  . Alcohol use No  . Drug use: No  . Sexual activity: Not on file   Other Topics Concern  . Not on file   Social History Narrative   Mental Health therapist at [employer]    Married - [name]   Review of Systems  Constitutional: Positive for  chills, fatigue and fever.  HENT: Positive for congestion. Negative for ear pain.   Eyes: Positive for discharge and redness. Negative for visual disturbance.  Respiratory: Positive for cough.   Musculoskeletal: Positive for myalgias.  Allergic/Immunologic: Negative for immunocompromised state.  Neurological: Positive for dizziness. Negative for light-headedness.  Psychiatric/Behavioral: Positive for sleep disturbance.   Objective:  Physical Exam  Constitutional: He is oriented to person, place, and time. He appears well-developed and well-nourished. No distress.  HENT:  Head: Normocephalic and atraumatic.  Right Ear: Tympanic membrane, external ear and ear canal normal.  Left Ear: Tympanic membrane, external ear and ear canal normal.  Nose: No rhinorrhea.  Mouth/Throat: Oropharynx is clear and moist and mucous membranes are normal. No oropharyngeal exudate or posterior oropharyngeal erythema.  Sinuses non tedner  Eyes: Pupils are equal, round, and reactive to light. Left eye exhibits no exudate (canthi). Left conjunctiva is injected (scleral, mild).  Slit lamp exam:      The right eye shows no corneal abrasion.       The left eye shows no corneal abrasion.  Anterior chamber clear No scratches  Neck: Neck supple.  Cardiovascular: Normal rate, regular rhythm, normal heart sounds and intact distal pulses.  Exam reveals no friction rub.   No murmur heard. Pulmonary/Chest: Effort normal and breath sounds normal. No respiratory distress. He has no wheezes. He has no rhonchi. He has no rales.  Abdominal: Soft. There is no tenderness.  Lymphadenopathy:       Head (right side): No preauricular adenopathy present.       Head (left side): No preauricular adenopathy present.    He has no cervical adenopathy.  Neurological: He is alert and oriented to person, place, and time.  Skin: Skin is warm and dry. No rash noted.  Psychiatric: He has a normal mood and affect. His behavior is normal.    Nursing note and vitals reviewed.   Vitals:   04/08/16 1510  BP: 118/80  Pulse: 89  Resp: 16  Temp: 98.3 F (36.8 C)  TempSrc: Oral  SpO2: 100%  Weight: 171 lb (77.6 kg)  Height: 5' 7.5" (1.715 m)    Visual Acuity Screening   Right eye Left eye Both eyes  Without correction: 20/13 20/13 20/13   With correction:      Results for orders placed or performed in visit on 04/08/16  POCT Influenza A/B  Result Value Ref Range   Influenza A, POC Negative Negative   Influenza B, POC Negative Negative  L eye: Anterior chamber clear,  Proparacaine 2 gtts applied,with less soreness after gtts. Lids everted, no foreign body identified. Fluorescein applied, no uptake.  Flushed with sterile water, no complications.  Assessment & Plan:    Dalton Kelly is a 36 y.o.  male Flu-like symptoms - Plan: POCT Influenza A/B, oseltamivir (TAMIFLU) 75 MG capsule  Acute conjunctivitis of left eye, unspecified acute conjunctivitis type - Plan: trimethoprim-polymyxin b (POLYTRIM) ophthalmic solution  Influenza-like illness - Plan: oseltamivir (TAMIFLU) 75 MG capsule  Cough - Plan: HYDROcodone-homatropine (HYCODAN) 5-1.5 MG/5ML syrup  Suspected influenza/viral illness. Tamiflu discussed, he would like to take that after discussion of possible benefits and potential side effects. Other symptomatic care was discussed, and Hycodan cough syrup given if needed for cough suppression at night. Do not combine with other hydrocodone he has used for back pain, and caution with other sedating medications.  Left eye conjunctivitis appears to be viral in office, but with reported discolored discharge throughout day, prescription for Polytrim drops given if persistent discharge throughout day following visit. rtc precautions. Meds ordered this encounter  Medications  . trimethoprim-polymyxin b (POLYTRIM) ophthalmic solution    Sig: Place 1 drop into the left eye every 6 (six) hours.    Dispense:  10 mL    Refill:   0  . oseltamivir (TAMIFLU) 75 MG capsule    Sig: Take 1 capsule (75 mg total) by mouth 2 (two) times daily.    Dispense:  10 capsule    Refill:  0  . HYDROcodone-homatropine (HYCODAN) 5-1.5 MG/5ML syrup    Sig: 33m by mouth a bedtime as needed for cough.    Dispense:  120 mL    Refill:  0   Patient Instructions    Tamiflu was sent to your pharmacy. Saline nasal spray at least 4 times per day if needed for nasal congestion, over the counter mucinex or mucinex DM as needed for cough, hydrocodone cough syrup at night if needed. tylenol over the counter for fever and body aches, and drink plenty of fluids. Other information as in instructions below.  Return to the clinic or go to the nearest emergency room if any of your symptoms worsen or new symptoms occur. Out of work for 24 hours after you have been free of fever, and that includes off a fever reducing medications.  Your left eye symptoms appear to be due to a viral conjunctivitis at this time, but if the discharge is not improving tomorrow, you can start the antibiotic drops.  Return to the clinic or go to the nearest emergency room if any of your symptoms worsen or new symptoms occur.   Influenza, Adult Influenza, more commonly known as "the flu," is a viral infection that primarily affects the respiratory tract. The respiratory tract includes organs that help you breathe, such as the lungs, nose, and throat. The flu causes many common cold symptoms, as well as a high fever and body aches. The flu spreads easily from person to person (is contagious). Getting a flu shot (influenza vaccination) every year is the best way to prevent influenza. What are the causes? Influenza is caused by a virus. You can catch the virus by:  Breathing in droplets from an infected person's cough or sneeze.  Touching something that was recently contaminated with the virus and then touching your mouth, nose, or eyes. What increases the risk? The following  factors may make you more likely to get the flu:  Not cleaning your hands frequently with soap and water or alcohol-based hand sanitizer.  Having close contact with many people during cold and flu season.  Touching your mouth, eyes, or nose without washing or sanitizing your hands first.  Not drinking enough fluids or not eating a healthy diet.  Not  getting enough sleep or exercise.  Being under a high amount of stress.  Not getting a yearly (annual) flu shot. You may be at a higher risk of complications from the flu, such as a severe lung infection (pneumonia), if you:  Are over the age of 75.  Are pregnant.  Have a weakened disease-fighting system (immune system). You may have a weakened immune system if you:  Have HIV or AIDS.  Are undergoing chemotherapy.  Aretaking medicines that reduce the activity of (suppress) the immune system.  Have a long-term (chronic) illness, such as heart disease, kidney disease, diabetes, or lung disease.  Have a liver disorder.  Are obese.  Have anemia. What are the signs or symptoms? Symptoms of this condition typically last 4-10 days and may include:  Fever.  Chills.  Headache, body aches, or muscle aches.  Sore throat.  Cough.  Runny or congested nose.  Chest discomfort and cough.  Poor appetite.  Weakness or tiredness (fatigue).  Dizziness.  Nausea or vomiting. How is this diagnosed? This condition may be diagnosed based on your medical history and a physical exam. Your health care provider may do a nose or throat swab test to confirm the diagnosis. How is this treated? If influenza is detected early, you can be treated with antiviral medicine that can reduce the length of your illness and the severity of your symptoms. This medicine may be given by mouth (orally) or through an IV tube that is inserted in one of your veins. The goal of treatment is to relieve symptoms by taking care of yourself at home. This may  include taking over-the-counter medicines, drinking plenty of fluids, and adding humidity to the air in your home. In some cases, influenza goes away on its own. Severe influenza or complications from influenza may be treated in a hospital. Follow these instructions at home:  Take over-the-counter and prescription medicines only as told by your health care provider.  Use a cool mist humidifier to add humidity to the air in your home. This can make breathing easier.  Rest as needed.  Drink enough fluid to keep your urine clear or pale yellow.  Cover your mouth and nose when you cough or sneeze.  Wash your hands with soap and water often, especially after you cough or sneeze. If soap and water are not available, use hand sanitizer.  Stay home from work or school as told by your health care provider. Unless you are visiting your health care provider, try to avoid leaving home until your fever has been gone for 24 hours without the use of medicine.  Keep all follow-up visits as told by your health care provider. This is important. How is this prevented?  Getting an annual flu shot is the best way to avoid getting the flu. You may get the flu shot in late summer, fall, or winter. Ask your health care provider when you should get your flu shot.  Wash your hands often or use hand sanitizer often.  Avoid contact with people who are sick during cold and flu season.  Eat a healthy diet, drink plenty of fluids, get enough sleep, and exercise regularly. Contact a health care provider if:  You develop new symptoms.  You have:  Chest pain.  Diarrhea.  A fever.  Your cough gets worse.  You produce more mucus.  You feel nauseous or you vomit. Get help right away if:  You develop shortness of breath or difficulty breathing.  Your skin or  nails turn a bluish color.  You have severe pain or stiffness in your neck.  You develop a sudden headache or sudden pain in your face or  ear.  You cannot stop vomiting. This information is not intended to replace advice given to you by your health care provider. Make sure you discuss any questions you have with your health care provider. Document Released: 02/08/2000 Document Revised: 07/19/2015 Document Reviewed: 12/05/2014 Elsevier Interactive Patient Education  2017 Elsevier Inc.    Viral Conjunctivitis, Adult Viral conjunctivitis is an inflammation of the clear membrane that covers the white part of your eye and the inner surface of your eyelid (conjunctiva). The inflammation is caused by a viral infection. The blood vessels in the conjunctiva become inflamed, causing the eye to become red or pink, and often itchy. Viral conjunctivitis can be easily passed from one person to another (is contagious). This condition is often called pink eye. What are the causes? This condition is caused by a virus. A virus is a type of contagious germ. It can be spread by touching objects that have been contaminated with the virus, such as doorknobs or towels. It can also be passed through droplets, such as from coughing or sneezing. What are the signs or symptoms? Symptoms of this condition include:  Eye redness.  Tearing or watery eyes.  Itchy and irritated eyes.  Burning feeling in the eyes.  Clear drainage from the eye.  Swollen eyelids.  A gritty feeling in the eye.  Light sensitivity. This condition often occurs with other symptoms, such as a fever, nausea, or a rash. How is this diagnosed? This condition is diagnosed with a medical history and physical exam. If you have discharge from your eye, the discharge may be tested to rule out other causes of conjunctivitis. How is this treated? Viral conjunctivitis does not respond to medicines that kill bacteria (antibiotics). Treatment for viral conjunctivitis is directed at stopping a bacterial infection from developing in addition to the viral infection. Treatment also aims to  relieve your symptoms, such as itching. This may be done with antihistamine drops or other eye medicines. Rarely, steroid eye drops or antiviral medicines may be prescribed. Follow these instructions at home: Medicines  Take or apply over-the-counter and prescription medicines only as told by your health care provider.  Be very careful to avoid touching the edge of the eyelid with the eye drop bottle or ointment tube when applying medicines to the affected eye. Being careful this way will stop you from spreading the infection to the other eye or to other people. Eye care  Avoid touching or rubbing your eyes.  Apply a warm, wet, clean washcloth to your eye for 10-20 minutes, 3-4 times per day or as told by your health care provider.  If you wear contact lenses, do not wear them until the inflammation is gone and your health care provider says it is safe to wear them again. Ask your health care provider how to sterilize or replace your contact lenses before using them again. Wear glasses until you can resume wearing contacts.  Avoid wearing eye makeup until the inflammation is gone. Throw away any old eye cosmetics that may be contaminated.  Gently wipe away any drainage from your eye with a warm, wet washcloth or a cotton ball. General instructions  Change or wash your pillowcase every day or as told by your health care provider.  Do not share towels, pillowcases, washcloths, eye makeup, makeup brushes, contact lenses, or glasses.  This may spread the infection.  Wash your hands often with soap and water. Use paper towels to dry your hands. If soap and water are not available, use hand sanitizer.  Try to avoid contact with other people for one week or as told by your health care provider. Contact a health care provider if:  Your symptoms do not improve with treatment or they get worse.  You have increased pain.  Your vision becomes blurry.  You have a fever.  You have facial pain,  redness, or swelling.  You have yellow or green drainage coming from your eye.  You have new symptoms. This information is not intended to replace advice given to you by your health care provider. Make sure you discuss any questions you have with your health care provider. Document Released: 05/03/2002 Document Revised: 09/08/2015 Document Reviewed: 08/28/2015 Elsevier Interactive Patient Education  2017 [employer]     IF you received an x-ray today, you will receive an [facility] from [facility]. Please contact [facility] at [phone] with questions or concerns regarding your invoice.   IF you received labwork today, you will receive an invoice from [facility]. Please contact [facility] at [phone] with questions or concerns regarding your invoice.   Our billing staff will not be able to assist you with questions regarding bills from these companies.  You will be contacted with the lab results as soon as they are available. The fastest way to get your results is to activate your My Chart account. Instructions are located on the last page of this paperwork. If you have not heard from us regarding the results in 2 weeks, please contact this office.      I personally performed the services described in this documentation, which was scribed in my presence. The recorded information has been reviewed and considered for accuracy and completeness, addended by me as needed, and agree with information above.  Signed,   Meredith StaggersJeffrey Michaeljames Milnes, MD [facility].  04/10/16 11:43 AM

## 2016-04-09 ENCOUNTER — Encounter: Payer: Self-pay | Admitting: Physician Assistant

## 2016-04-10 MED ORDER — HYDROCOD POLST-CPM POLST ER 10-8 MG/5ML PO SUER: 5.0000 mL | Freq: Two times a day (BID) | ORAL | 0 refills | Status: DC

## 2016-04-10 NOTE — Telephone Encounter (Signed)
Done - pt knows this is ready through [employer] [facility]

## 2016-04-19 ENCOUNTER — Encounter: Payer: Self-pay | Admitting: Physician Assistant

## 2016-04-21 ENCOUNTER — Other Ambulatory Visit: Payer: Self-pay | Admitting: Physician Assistant

## 2016-04-21 DIAGNOSIS — F411 Generalized anxiety disorder: Secondary | ICD-10-CM

## 2016-04-21 DIAGNOSIS — G43109 Migraine with aura, not intractable, without status migrainosus: Secondary | ICD-10-CM

## 2016-04-21 MED ORDER — CLONAZEPAM 0.5 MG PO TABS: ORAL_TABLET | ORAL | 0 refills | Status: DC

## 2016-04-21 MED ORDER — BUSPIRONE HCL 7.5 MG PO TABS: 7.5000 mg | ORAL_TABLET | Freq: Three times a day (TID) | ORAL | 0 refills | Status: DC

## 2016-04-21 NOTE — Progress Notes (Signed)
Needs refills of medications - plan to increase Buspar 7.5mg  to tid dosing

## 2016-05-10 ENCOUNTER — Encounter: Payer: Self-pay | Admitting: Physician Assistant

## 2016-05-10 DIAGNOSIS — F411 Generalized anxiety disorder: Secondary | ICD-10-CM

## 2016-05-15 MED ORDER — BUSPIRONE HCL 10 MG PO TABS: 10.0000 mg | ORAL_TABLET | Freq: Three times a day (TID) | ORAL | 2 refills | Status: DC

## 2016-05-28 ENCOUNTER — Encounter: Payer: Self-pay | Admitting: Physician Assistant

## 2016-05-29 ENCOUNTER — Encounter: Payer: Self-pay | Admitting: Physician Assistant

## 2016-05-29 DIAGNOSIS — F411 Generalized anxiety disorder: Secondary | ICD-10-CM

## 2016-05-29 DIAGNOSIS — G43109 Migraine with aura, not intractable, without status migrainosus: Secondary | ICD-10-CM

## 2016-06-02 MED ORDER — BUTALBITAL-APAP-CAFF-COD 50-325-40-30 MG PO CAPS: 1.0000 | ORAL_CAPSULE | ORAL | 2 refills | Status: DC | PRN

## 2016-06-02 NOTE — Telephone Encounter (Deleted)
I will be in to sign this am

## 2016-06-03 ENCOUNTER — Telehealth: Payer: Self-pay | Admitting: Physician Assistant

## 2016-06-03 NOTE — Telephone Encounter (Signed)
THIS MESSAGE IS FROM CINDY (PHARMACIST) AT [facility] FOR [name]: SHE WOULD LIKE SARAH TO KNOW THAT THIS PATIENT IS TAKING BUTALBITAL-ACETAMINOPHEN-CAFFEINE (FIORICET WITH CODEINE) AND THAT HE RECEIVED #30 ON 05/27/16 PRESCRIBED BY DR. Michel Santee. HE ALSO RECEIVED #90 FROM [facility] ON 05/06/16. HE IS TAKING HYDROCODONE-ACETAMINOPHEN (NORCO/VICODIN) AND HE RECEIVED #90 ON 05/27/16 PRESCRIBED BY JOSHUA CHADWELL. SHE WANTS TO BRING THIS TO SARAH'S ATTENTION. BEST PHONE [phone] (PLEASE ASK FOR CINDY-PHARMACIST AT [facility]) MBC

## 2016-06-03 NOTE — Telephone Encounter (Signed)
Pt calling following up on message from earlier. He said that the Pharmacy is ready to fill the medication they just need a phone call from our office with confirmation that this can be filled. Pt said he may have enough medicine to last for today but that is it. Please advise.

## 2016-06-04 ENCOUNTER — Telehealth: Payer: Self-pay | Admitting: Physician Assistant

## 2016-06-04 NOTE — Telephone Encounter (Signed)
Pt is needing to talk with someone about his refill on his meds -he states that he is completely out and Maralyn Sago had called it in on Monday and there is an issue with filling it and the pharmacy needs a call back from Maralyn Sago and he needs a call back today   Best number [phone]

## 2016-06-04 NOTE — Telephone Encounter (Signed)
lmtcb at [facility] what is the problem?

## 2016-06-05 NOTE — Telephone Encounter (Signed)
Spoke with [facility]. Pt picked script up yesterday

## 2016-06-08 ENCOUNTER — Other Ambulatory Visit: Payer: Self-pay | Admitting: Physician Assistant

## 2016-06-08 DIAGNOSIS — F411 Generalized anxiety disorder: Secondary | ICD-10-CM

## 2016-06-09 MED ORDER — CLONAZEPAM 0.5 MG PO TABS: ORAL_TABLET | ORAL | 0 refills | Status: DC

## 2016-06-09 NOTE — Telephone Encounter (Signed)
Clonazepam called in to pharmacy Pt is aware

## 2016-06-09 NOTE — Telephone Encounter (Signed)
Please send in for patient

## 2016-06-18 DIAGNOSIS — G43709 Chronic migraine without aura, not intractable, without status migrainosus: Secondary | ICD-10-CM | POA: Diagnosis not present

## 2016-06-26 ENCOUNTER — Encounter: Payer: Self-pay | Admitting: Physician Assistant

## 2016-06-26 ENCOUNTER — Ambulatory Visit (INDEPENDENT_AMBULATORY_CARE_PROVIDER_SITE_OTHER): Payer: 59 | Admitting: Physician Assistant

## 2016-06-26 DIAGNOSIS — G43109 Migraine with aura, not intractable, without status migrainosus: Secondary | ICD-10-CM

## 2016-06-26 DIAGNOSIS — F411 Generalized anxiety disorder: Secondary | ICD-10-CM | POA: Diagnosis not present

## 2016-06-26 MED ORDER — BUSPIRONE HCL 10 MG PO TABS: 10.0000 mg | ORAL_TABLET | Freq: Three times a day (TID) | ORAL | 0 refills | Status: DC

## 2016-06-26 MED ORDER — BUSPIRONE HCL 10 MG PO TABS: 20.0000 mg | ORAL_TABLET | Freq: Three times a day (TID) | ORAL | 5 refills | Status: DC

## 2016-06-26 MED ORDER — DULOXETINE HCL 30 MG PO CPEP: 90.0000 mg | ORAL_CAPSULE | Freq: Every day | ORAL | 0 refills | Status: DC

## 2016-06-26 MED ORDER — CLONAZEPAM 0.5 MG PO TABS: ORAL_TABLET | ORAL | 0 refills | Status: DC

## 2016-06-26 MED ORDER — BUTALBITAL-APAP-CAFF-COD 50-325-40-30 MG PO CAPS: 1.0000 | ORAL_CAPSULE | ORAL | 2 refills | Status: DC | PRN

## 2016-06-26 NOTE — Patient Instructions (Signed)
     IF you received an x-ray today, you will receive an invoice from [facility]. Please contact [facility] at [phone] with questions or concerns regarding your invoice.   IF you received labwork today, you will receive an invoice from [facility]. Please contact [facility] at [phone] with questions or concerns regarding your invoice.   Our billing staff will not be able to assist you with questions regarding bills from these companies.  You will be contacted with the lab results as soon as they are available. The fastest way to get your results is to activate your My Chart account. Instructions are located on the last page of this paperwork. If you have not heard from us regarding the results in 2 weeks, please contact this office.     
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
## 2016-06-29 ENCOUNTER — Other Ambulatory Visit: Payer: Self-pay | Admitting: Physician Assistant

## 2016-06-30 NOTE — Telephone Encounter (Signed)
06/26/16 last ov  03/03/16 last refill

## 2016-07-03 ENCOUNTER — Encounter: Payer: Self-pay | Admitting: Physician Assistant

## 2016-07-10 DIAGNOSIS — G43709 Chronic migraine without aura, not intractable, without status migrainosus: Secondary | ICD-10-CM | POA: Diagnosis not present

## 2016-08-04 ENCOUNTER — Encounter: Payer: Self-pay | Admitting: Physician Assistant

## 2016-08-04 ENCOUNTER — Telehealth: Payer: Self-pay | Admitting: Family Medicine

## 2016-08-04 DIAGNOSIS — F411 Generalized anxiety disorder: Secondary | ICD-10-CM

## 2016-08-04 DIAGNOSIS — G4700 Insomnia, unspecified: Secondary | ICD-10-CM

## 2016-08-04 NOTE — Telephone Encounter (Signed)
[facility] CALLING FOR A REFILL ON KLONOPIN PLEASE CALL PT WHEN READY

## 2016-08-06 NOTE — Telephone Encounter (Signed)
Pt calling back about his klonopin

## 2016-08-07 ENCOUNTER — Other Ambulatory Visit: Payer: Self-pay | Admitting: Physician Assistant

## 2016-08-07 ENCOUNTER — Telehealth: Payer: Self-pay

## 2016-08-07 DIAGNOSIS — F411 Generalized anxiety disorder: Secondary | ICD-10-CM

## 2016-08-07 MED ORDER — CLONAZEPAM 0.5 MG PO TABS: ORAL_TABLET | ORAL | 0 refills | Status: DC

## 2016-08-07 NOTE — Telephone Encounter (Signed)
Wouldn't let me mark done but is documented that it has been called in

## 2016-08-07 NOTE — Telephone Encounter (Signed)
Called into pharmacy

## 2016-08-07 NOTE — Telephone Encounter (Signed)
Spoke to pharmacy about klonopin and is being filled.

## 2016-08-07 NOTE — Telephone Encounter (Signed)
Pt had to return call again for his Refill - it has been done and sent to the pharmacy.

## 2016-08-14 ENCOUNTER — Other Ambulatory Visit: Payer: Self-pay | Admitting: Physician Assistant

## 2016-08-16 MED ORDER — ONDANSETRON 4 MG PO TBDP: 4.0000 mg | ORAL_TABLET | Freq: Three times a day (TID) | ORAL | 2 refills | Status: DC | PRN

## 2016-08-16 NOTE — Addendum Note (Signed)
Addended by: Morrell RiddleWEBER, Cindel Daugherty L on: 08/16/2016 01:29 PM   Modules accepted: Orders

## 2016-08-22 MED ORDER — TRAZODONE HCL 150 MG PO TABS: ORAL_TABLET | ORAL | 3 refills | Status: DC

## 2016-08-22 NOTE — Addendum Note (Signed)
Addended by: Morrell RiddleWEBER, Corra Kaine L on: 08/22/2016 11:55 AM   Modules accepted: Orders

## 2016-08-26 DIAGNOSIS — M545 Low back pain: Secondary | ICD-10-CM | POA: Diagnosis not present

## 2016-09-08 ENCOUNTER — Encounter: Payer: Self-pay | Admitting: Physician Assistant

## 2016-09-08 DIAGNOSIS — F411 Generalized anxiety disorder: Secondary | ICD-10-CM

## 2016-09-09 ENCOUNTER — Emergency Department (HOSPITAL_BASED_OUTPATIENT_CLINIC_OR_DEPARTMENT_OTHER)
Admission: EM | Admit: 2016-09-09 | Discharge: 2016-09-10 | Disposition: A | Discharge status: Home / Self Care | Payer: 59 | Attending: Emergency Medicine | Admitting: Emergency Medicine

## 2016-09-09 ENCOUNTER — Encounter (HOSPITAL_BASED_OUTPATIENT_CLINIC_OR_DEPARTMENT_OTHER): Payer: Self-pay

## 2016-09-09 DIAGNOSIS — G4700 Insomnia, unspecified: Secondary | ICD-10-CM | POA: Diagnosis not present

## 2016-09-09 DIAGNOSIS — G43909 Migraine, unspecified, not intractable, without status migrainosus: Secondary | ICD-10-CM | POA: Insufficient documentation

## 2016-09-09 DIAGNOSIS — G43009 Migraine without aura, not intractable, without status migrainosus: Secondary | ICD-10-CM | POA: Diagnosis not present

## 2016-09-09 DIAGNOSIS — Z79899 Other long term (current) drug therapy: Secondary | ICD-10-CM | POA: Insufficient documentation

## 2016-09-09 DIAGNOSIS — Z87891 Personal history of nicotine dependence: Secondary | ICD-10-CM | POA: Insufficient documentation

## 2016-09-09 HISTORY — DX: Opioid dependence, uncomplicated: F11.20

## 2016-09-09 HISTORY — DX: Migraine, unspecified, not intractable, without status migrainosus: G43.909

## 2016-09-09 HISTORY — DX: Sedative, hypnotic or anxiolytic dependence, uncomplicated: F13.20

## 2016-09-09 NOTE — ED Triage Notes (Signed)
Pt reports migraine x2 days, history of same. Titrating off of klonopin and no sleep in 4 days. Associated light sensitivity, nausea, migraine did not respond to Fioricet.

## 2016-09-10 ENCOUNTER — Telehealth: Payer: Self-pay

## 2016-09-10 ENCOUNTER — Encounter (HOSPITAL_BASED_OUTPATIENT_CLINIC_OR_DEPARTMENT_OTHER): Payer: Self-pay | Admitting: Emergency Medicine

## 2016-09-10 MED ORDER — CLONAZEPAM 0.5 MG PO TABS: 0.5000 mg | ORAL_TABLET | Freq: Every day | ORAL | 0 refills | Status: DC

## 2016-09-10 MED ORDER — SODIUM CHLORIDE 0.9 % IV SOLN: 1000.0000 mL | INTRAVENOUS | Status: DC

## 2016-09-10 MED ORDER — ZOLPIDEM TARTRATE 5 MG PO TABS: 5.0000 mg | ORAL_TABLET | Freq: Every evening | ORAL | 0 refills | Status: DC | PRN

## 2016-09-10 MED ORDER — MORPHINE SULFATE (PF) 2 MG/ML IV SOLN
2.0000 mg | Freq: Once | INTRAVENOUS | Status: AC
  Administered 2016-09-10: 2 mg via INTRAVENOUS
  Filled 2016-09-10: qty 1

## 2016-09-10 MED ORDER — SODIUM CHLORIDE 0.9 % IV BOLUS (SEPSIS)
1000.0000 mL | Freq: Once | INTRAVENOUS | Status: AC
  Administered 2016-09-10: 1000 mL via INTRAVENOUS

## 2016-09-10 MED ORDER — MORPHINE SULFATE (PF) 2 MG/ML IV SOLN
1.0000 mg | Freq: Once | INTRAVENOUS | Status: AC
  Administered 2016-09-10: 1 mg via INTRAVENOUS
  Filled 2016-09-10: qty 1

## 2016-09-10 MED ORDER — DEXAMETHASONE SODIUM PHOSPHATE 10 MG/ML IJ SOLN
10.0000 mg | Freq: Once | INTRAMUSCULAR | Status: AC
  Administered 2016-09-10: 10 mg via INTRAVENOUS
  Filled 2016-09-10: qty 1

## 2016-09-10 MED ORDER — METOCLOPRAMIDE HCL 5 MG/ML IJ SOLN
10.0000 mg | Freq: Once | INTRAMUSCULAR | Status: AC
  Administered 2016-09-10: 10 mg via INTRAVENOUS
  Filled 2016-09-10: qty 2

## 2016-09-10 NOTE — Telephone Encounter (Signed)
Patient called office regarding medications and mychart messages. Spoke to PA Weber, made adjustments to patient medications. Medication sent to pharmacy, my chart message routed back to patient as well as phone call placed to make patient aware of RX at pharmacy. Patient advised to make appointment with PA Weber within the next 30 days. Patient verbalized understanding, thanked Clinical research associatewriter for the call./ S.Tita Terhaar,CMA

## 2016-09-10 NOTE — Discharge Instructions (Signed)
You may take Ambien as needed for insomnia. Follow-up with your primary care provider for further evaluation of your insomnia. Return to the emergency department if you have persistent headache despite home medications, continuing insomnia, or any new or worsening symptoms.

## 2016-09-10 NOTE — Telephone Encounter (Signed)
Klonopin to pharmacy

## 2016-09-10 NOTE — ED Provider Notes (Signed)
[facility]   CSN: [id number] Arrival date & time: 09/09/16  2333     History   Chief Complaint Chief Complaint  Patient presents with  . Migraine    HPI Dalton Kelly is a 36 y.o. male presenting with migraine x2 days and no sleep 4 days.  Patient with long history of migraines being seen by neurology for this presenting with 2 days of migraine symptoms. Reports pain on the left side of his head with associated photophobia and phonophobia. He has nausea, but has not yet vomited. He tried his at home medicine of Fioricet without relief. He states he has received one of 3 Botox injections to attempt management of his migraines. He states that his pain right now feels like his typical migraine, just lasing longer. Additionally, patient reports that he has been unable to sleep for the past 4 days. He has been weaning off his Klonopin, which she often used for sleep. Last dose 5 days ago. For the past 4 days, he reports every time he tries to go to bed, he just lies there awake. He does not feel tired. He has tried trazodone and melatonin without relief. He has also tried NyQuil without relief. He reports he has had frequent episodes of clamminess in the past several days. Additionally when he was typing yesterday and today, he felt like his fingers were twitching. He denies fever, chest pain, shortness of breath, neck pain or stiffness, abdominal pain, urinary symptoms, abnormal bowel movements, numbness or tingling, or weakness of his extremities.    HPI  Past Medical History:  Diagnosis Date  . Anxiety   . Back pain   . Barbiturate dependence (HCC)   . Episode of syncope    Recent that sounds vasovagal  . Fatigue   . Generalized anxiety disorder   . Hair loss   . Insomnia   . Irregular bowel habits   . Migraines   . Opioid dependence in controlled environment (HCC)   . Systolic click     Patient Active Problem List   Diagnosis Date Noted  .  Low back pain 12/01/2015  . 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 Generalized anxiety disorder 06/24/2011  . Migraine 06/24/2011  . Insomnia 06/24/2011    Past Surgical History:  Procedure Laterality Date  . WISDOM TOOTH EXTRACTION         Home Medications    Prior to Admission medications   Medication Sig Start Date End Date Taking? Authorizing Provider  busPIRone (BUSPAR) 10 MG tablet Take 2 tablets (20 mg total) by mouth 3 (three) times daily. 06/26/16  Yes Weber, Dema Severin, PA-C  butalbital-acetaminophen-caffeine (FIORICET WITH CODEINE) 50-325-40-30 MG capsule Take 1 capsule by mouth every 4 (four) hours as needed for headache. 06/26/16  Yes Weber, Dema Severin, PA-C  cyclobenzaprine (FLEXERIL) 10 MG tablet Take by mouth. 10/25/15  Yes [provider]  DULoxetine (CYMBALTA) 30 MG capsule Take 3 capsules (90 mg total) by mouth daily. 06/26/16  Yes Weber, Sarah L, PA-C  ondansetron (ZOFRAN-ODT) 4 MG disintegrating tablet Take 1 tablet (4 mg total) by mouth every 8 (eight) hours as needed for nausea. 08/16/16  Yes Weber, Dema Severin, PA-C  traZODone (DESYREL) 150 MG tablet 1/2 at bedtime 08/22/16  Yes Weber, Dema Severin, PA-C  clonazePAM (KLONOPIN) 0.5 MG tablet 1 am, 0.5 midday and 1.5 hs 08/07/16   Weber, Dema Severin, PA-C  HYDROcodone-acetaminophen (NORCO/VICODIN) 5-325 MG tablet 1 at night 09/03/15   [provider]  zolpidem (AMBIEN) 5 MG tablet Take 1 tablet (5 mg total) by mouth at bedtime as needed for sleep. 09/10/16   Quinisha Mould, PA-C    Family History Family History  Problem Relation Age of Onset  . Cancer Mother   . Hypertension Father   . Stroke Maternal Grandmother   . Alzheimer's disease Maternal Grandmother   . Cancer Maternal Grandfather   . Diabetes Maternal Grandfather   . Cancer Paternal Grandmother   . Heart failure Paternal Grandfather     Social History Social History  Substance Use Topics  . Smoking status: Former Games developermoker  . Smokeless tobacco: Never Used  .  Alcohol use No     Allergies   Ibuprofen and Nsaids   Review of Systems Review of Systems  All other systems reviewed and are negative.    Physical Exam Updated Vital Signs BP (!) 134/102 (BP Location: Left Arm)   Pulse 86   Temp 98.4 F (36.9 C) (Oral)   Resp 16   Ht 5\' 8"  (1.727 m)   Wt 77.1 kg (170 lb)   SpO2 100%   BMI 25.85 kg/m   Physical Exam  Constitutional: He is oriented to person, place, and time. He appears well-developed and well-nourished. No distress.  HENT:  Head: Normocephalic and atraumatic.  Eyes: Pupils are equal, round, and reactive to light. Conjunctivae and EOM are normal.  Neck: Normal range of motion.  Cardiovascular: Normal rate, regular rhythm and intact distal pulses.   Pulmonary/Chest: Effort normal and breath sounds normal. No respiratory distress. He has no wheezes.  Abdominal: Soft. Bowel sounds are normal. He exhibits no distension. There is no tenderness.  Musculoskeletal: Normal range of motion.  Lymphadenopathy:    He has no cervical adenopathy.  Neurological: He is alert and oriented to person, place, and time. He has normal strength and normal reflexes. No cranial nerve deficit or sensory deficit. He displays a negative Romberg sign. GCS eye subscore is 4. GCS verbal subscore is 5. GCS motor subscore is 6.  Fine movement and coordination intact.  Skin: Skin is warm. He is diaphoretic.  Psychiatric: He has a normal mood and affect.  Nursing note and vitals reviewed.    ED Treatments / Results  Labs (all labs ordered are listed, but only abnormal results are displayed) Labs Reviewed - No data to display  EKG  EKG Interpretation None       Radiology No results found.  Procedures Procedures (including critical care time)  Medications Ordered in ED Medications  sodium chloride 0.9 % bolus 1,000 mL (1,000 mLs Intravenous New Bag/Given 09/10/16 0056)    Followed by  0.9 %  sodium chloride infusion (not administered)    morphine 2 MG/ML injection 1 mg (not administered)  metoCLOPramide (REGLAN) injection 10 mg (10 mg Intravenous Given 09/10/16 0056)  dexamethasone (DECADRON) injection 10 mg (10 mg Intravenous Given 09/10/16 0106)  morphine 2 MG/ML injection 2 mg (2 mg Intravenous Given 09/10/16 0112)     Initial Impression / Assessment and Plan / ED Course  I have reviewed the triage vital signs and the nursing notes.  Pertinent labs & imaging results that were available during my care of the patient were reviewed by me and considered in my medical decision making (see chart for details).     Patient presenting with migraine symptoms 2 days and insomnia 4. Patient states his migraine feels the same as normal, just extended duration. Patient thinks one of the triggers might  be his insomnia. On physical exam, patient neurologically intact. Discussed plan to give headache cocktail for symptom relief, patient states that last time this was unsuccessful. He reports he is allergic to ibuprofen and Imitrex. NCCSRS checked, and patient has appropriate prescriptions from one provider for his medical conditions. No extra pain prescriptions found. Discussed case with attending, Dr. Preston Fleeting agrees to plan. Will give morphine for pain control here in the ED, and discuss concern for rebound headache with frequent narcotic use.  Patient states his insomnia has been going on for 4 days without any relief. He states every once in a while he will drift off, but wakes up immediately. In he past few days, he's developed some clamminess of his hands, and some twitching of his fingers when he types. Patient stopped Klonopin 5 days ago, which he had been on for 9 years. Has been taking trazodone and melatonin without relief. I believe the clamminess and twitching of his fingers may be related to his insomnia and discontinued use of Klonopin. Plan is to relieve headache and give prescription for Ambien to encourage sleep.  On  reassessment, patient reports his pain is improved with the headache cocktail from a 10 to a 7. We'll give 1 more milligram of morphine for further relief. Will discharge patient with instructions to follow-up with primary care for further evaluation of insomnia. Return precautions given. Patient states he understands and agrees to plan.  Final Clinical Impressions(s) / ED Diagnoses   Final diagnoses:  Migraine without status migrainosus, not intractable, unspecified migraine type  Insomnia, unspecified type    New Prescriptions New Prescriptions   ZOLPIDEM (AMBIEN) 5 MG TABLET    Take 1 tablet (5 mg total) by mouth at bedtime as needed for sleep.     Alveria Apley, PA-C 09/10/16 0159    Dione Booze, MD 09/10/16 [phone]

## 2016-09-10 NOTE — Telephone Encounter (Signed)
PATIENT WANTED A MESSAGE PUT IN FOR SARAH TO CALL HIM AS SOON AS POSSIBLE. HE SAID HE WOULD ALSO SEND HER ANOTHER MY-CHART MESSAGE TO ANSWER THE QUESTIONS SHE WANTED TO KNOW. HE SAID HE NEEDS SARAH TO CALL HIM BACK TODAY AS SOON AS POSSIBLE. BEST PHONE [phone] (CELL) MBC

## 2016-09-10 NOTE — Telephone Encounter (Signed)
Can we call and get more information about what the patient needs.

## 2016-09-16 DIAGNOSIS — M5481 Occipital neuralgia: Secondary | ICD-10-CM | POA: Diagnosis not present

## 2016-09-16 DIAGNOSIS — M791 Myalgia: Secondary | ICD-10-CM | POA: Diagnosis not present

## 2016-09-16 DIAGNOSIS — M542 Cervicalgia: Secondary | ICD-10-CM | POA: Diagnosis not present

## 2016-09-16 DIAGNOSIS — R51 Headache: Secondary | ICD-10-CM | POA: Diagnosis not present

## 2016-09-20 ENCOUNTER — Encounter: Payer: Self-pay | Admitting: Family Medicine

## 2016-09-20 ENCOUNTER — Ambulatory Visit (INDEPENDENT_AMBULATORY_CARE_PROVIDER_SITE_OTHER): Payer: 59 | Admitting: Family Medicine

## 2016-09-20 VITALS — BP 138/93 | HR 110 | Temp 97.8°F | Resp 18 | Ht 69.69 in | Wt 181.0 lb

## 2016-09-20 DIAGNOSIS — F411 Generalized anxiety disorder: Secondary | ICD-10-CM

## 2016-09-20 DIAGNOSIS — R7401 Elevation of levels of liver transaminase levels: Secondary | ICD-10-CM

## 2016-09-20 DIAGNOSIS — Z5181 Encounter for therapeutic drug level monitoring: Secondary | ICD-10-CM | POA: Diagnosis not present

## 2016-09-20 DIAGNOSIS — R740 Nonspecific elevation of levels of transaminase and lactic acid dehydrogenase [LDH]: Secondary | ICD-10-CM | POA: Diagnosis not present

## 2016-09-20 DIAGNOSIS — G43109 Migraine with aura, not intractable, without status migrainosus: Secondary | ICD-10-CM | POA: Diagnosis not present

## 2016-09-20 MED ORDER — CLONAZEPAM 0.5 MG PO TABS: ORAL_TABLET | ORAL | 2 refills | Status: DC

## 2016-09-20 MED ORDER — BUTALBITAL-APAP-CAFF-COD 50-325-40-30 MG PO CAPS: 1.0000 | ORAL_CAPSULE | ORAL | 2 refills | Status: DC | PRN

## 2016-09-20 MED ORDER — DULOXETINE HCL 60 MG PO CPEP: 60.0000 mg | ORAL_CAPSULE | Freq: Every day | ORAL | 1 refills | Status: DC

## 2016-09-20 NOTE — Progress Notes (Addendum)
Subjective:  By signing my name below, I, Dalton Kelly, attest that this documentation has been prepared under the direction and in the presence of Dalton SorensonEva Bonita Brindisi, MD. Electronically Signed: Stann Oresung-Kai Kelly, Scribe. 09/20/2016 , 12:05 PM .  Patient was seen in Room 12 .   Patient ID: Dalton Kelly, male    DOB: 04-05-1980, 36 y.o.   MRN: [id number] Chief Complaint  Patient presents with  . Establish Care    would like to switch providers for personal reasons    HPI Dalton Kelly is a 36 y.o. male who [facility] to [facility] care.    Migraine headaches  Insomnia   Ortho Dr. Madelon Lipsaffrey and Estanislado SpireJosh Chadwell for back pain every 6 months for refills.   Was seeing Dr. Merla Richesoolittle for 13 years trying different med regimens and so for the past 9 years he would come see Dr. Merla Richesoolittle every 6 months and everything was very stable.  After Dr. Merla Richesoolittle retired, he switched to a Animatorcolleague.  He has been to the hosp 3 times since November- one time lots of things after a trip, twice for migraines.. 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 He has been transitioning to buspar from klonopin and then started on cymbalta for chronic back pain and anxiety. He has changed his regimen  He has changed botox and trigger point injections throughout shoulder, neck, forehead which he tried a few days ago. He is seeing [facility] b[facility] and was referred by neurology $thousands in MRIs and CT scans in working up and treating worsening migraines topamax made hair loss, nadolol fatigue.  migraines since 36 yo, tried all the tricyclics, and failed all the triptans including injectable and nasal Fioricet w/ codeine is as needed.  His mother as been on this regimen for 35 years and has also been stable without need for dose increase. His mother is also on nadolol and amitriptyline which he  failed both.  He does take the fioricet as soon as a headache starts.  Has had generalized anxiety his whole life and treatment started in college - around 36 yo. Tried xanax but it was to short. He has tried lexapro 10 a year ago.  Was nervous about the side effects of ssris and does not recall trying anything else for anxiety.  Sleep stable for years but no longer now that he is only taking 0.5mg  klonopoin qhs instead of 1 mg.   Bought BP cuff at home as he noticed his blood pressure has increased over the years.   Stable married relationship and husband is also telling that he has had negative mood changes to the point of SI which is completely new.  He felt like he was living his best life before and now everything is crumbling, feels horrible.   He increased cymbalta to 90mg  for only several days as after 3-4d of taking it he developed muscle twites and spasms worse at night.  Does PT for back pain but has gained weight with all of the stress.   Past Medical History:  Diagnosis Date  . Anxiety   . Back pain   . Barbiturate dependence (HCC)   . Episode of syncope    Recent that sounds vasovagal  . Fatigue   . Generalized anxiety disorder   . Hair loss   . Insomnia   .  Irregular bowel habits   . Migraines   . Opioid dependence in controlled environment (HCC)   . Systolic click    Prior to Admission medications   Medication Sig Start Date End Date Taking? Authorizing Provider  busPIRone (BUSPAR) 10 MG tablet Take 2 tablets (20 mg total) by mouth 3 (three) times daily. 06/26/16  Yes Weber, Dema SeverinSarah L, PA-C  butalbital-acetaminophen-caffeine (FIORICET WITH CODEINE) 50-325-40-30 MG capsule Take 1 capsule by mouth every 4 (four) hours as needed for headache. 06/26/16  Yes Weber, Sarah L, PA-C  clonazePAM (KLONOPIN) 0.5 MG tablet Take 1 tablet (0.5 mg total) by mouth daily. 09/10/16  Yes Weber, Dema SeverinSarah L, PA-C  cyclobenzaprine (FLEXERIL) 10 MG tablet Take by mouth. 10/25/15  Yes [provider]  DULoxetine (CYMBALTA) 30 MG capsule Take 3 capsules (90 mg total) by mouth daily. 06/26/16  Yes Weber, Sarah L, PA-C  ondansetron (ZOFRAN-ODT) 4 MG disintegrating tablet Take 1 tablet (4 mg total) by mouth every 8 (eight) hours as needed for nausea. 08/16/16  Yes Weber, Sarah L, PA-C  traMADol (ULTRAM) 50 MG tablet Take by mouth every 6 (six) hours as needed.   Yes [provider]  traZODone (DESYREL) 150 MG tablet 1/2 at bedtime 08/22/16  Yes Weber, Dema SeverinSarah L, PA-C   Allergies  Allergen Reactions  . Ibuprofen     Abdominal pain  . Nsaids Nausea And Vomiting   Past Surgical History:  Procedure Laterality Date  . WISDOM TOOTH EXTRACTION     Family History  Problem Relation Age of Onset  . Cancer Mother   . Hypertension Father   . Stroke Maternal Grandmother   . Alzheimer's disease Maternal Grandmother   . Cancer Maternal Grandfather   . Diabetes Maternal Grandfather   . Cancer Paternal Grandmother   . Heart failure Paternal Grandfather    Social History   Social History  . Marital status: Married    Spouse name: Dalton Kelly  . Number of children: N/A  . Years of education: N/A   Occupational History  . therapist     [employer]   Social History Main Topics  . Smoking status: Former Games developermoker  . Smokeless tobacco: Never Used  . Alcohol use No  . Drug use: No  . Sexual activity: Not Asked   Other Topics Concern  . None   Social History Narrative   Mental Health therapist at [employer]    Married - Dalton Kelly    Review of Systems See hpi    Objective:   Physical Exam  Constitutional: He is oriented to person, place, and time. He appears well-developed and well-nourished. No distress.  HENT:  Head: Normocephalic and atraumatic.  Eyes: Pupils are equal, round, and reactive to light. EOM are normal.  Neck: Neck supple.  Cardiovascular: Normal rate.   Pulmonary/Chest: Effort normal. No respiratory distress.  Musculoskeletal: Normal range of motion.    Neurological: He is alert and oriented to person, place, and time.  Skin: Skin is warm and dry.  Psychiatric: He has a normal mood and affect. His behavior is normal.  Nursing note and vitals reviewed.   BP (!) 138/93   Pulse (!) 110   Temp 97.8 F (36.6 C) (Oral)   Resp 18   Ht 5' 9.69" (1.77 m)   Wt 181 lb (82.1 kg)   SpO2 100%   BMI 26.21 kg/m      Assessment & Plan:   1. Transaminitis   2. GAD (generalized anxiety disorder)  3. Medication monitoring encounter   4. Migraine with aura and without status migrainosus, not intractable     Orders Placed This Encounter  Procedures  . Comprehensive metabolic panel  . CBC with Differential/Platelet  . TSH    Meds ordered this encounter  Medications  . traMADol (ULTRAM) 50 MG tablet    Sig: Take by mouth every 6 (six) hours as needed.  . clonazePAM (KLONOPIN) 0.5 MG tablet    Sig: Take 1 tab po qam and midday and 2 tabs po qhs    Dispense:  120 tablet    Refill:  2  . butalbital-acetaminophen-caffeine (FIORICET WITH CODEINE) 50-325-40-30 MG capsule    Sig: Take 1 capsule by mouth every 4 (four) hours as needed for headache.    Dispense:  90 capsule    Refill:  2  . DULoxetine (CYMBALTA) 60 MG capsule    Sig: Take 1 capsule (60 mg total) by mouth daily.    Dispense:  90 capsule    Refill:  1   Over 40 min spent in face-to-face evaluation of and consultation with patient and coordination of care.  Over 50% of this time was spent counseling this patient.  Dalton Kelly, M.D.  Primary Care at [facility] [address] [place], [place] [address] [phone] phone [phone] fax  09/22/16 10:14 PM

## 2016-09-20 NOTE — Patient Instructions (Addendum)
     IF you received an x-ray today, you will receive an invoice from [facility]. Please contact [facility] at [phone] with questions or concerns regarding your invoice.   IF you received labwork today, you will receive an invoice from [facility]. Please contact [facility] at [phone] with questions or concerns regarding your invoice.   Our billing staff will not be able to assist you with questions regarding bills from these companies.  You will be contacted with the lab results as soon as they are available. The fastest way to get your results is to activate your My Chart account. Instructions are located on the last page of this paperwork. If you have not heard from us regarding the results in 2 weeks, please contact this office.     Primary Care at [facility] Policy for Prescribing Controlled Substances  (Revised 12/2011, 08/2016) 1. Prescriptions for controlled substances will be filled by ONE provider at PCP with whom you have established and developed a plan for your care, including follow-up. 2. Providers at PCP follow the health system rules for prescribing controlled substances, which was developed using recommendations from the Centers For Disease Control. 3. You are encouraged to schedule an appointment with your prescriber in advance for follow-up visits whenever possible.

## 2016-09-21 ENCOUNTER — Other Ambulatory Visit: Payer: Self-pay | Admitting: Physician Assistant

## 2016-09-21 DIAGNOSIS — F411 Generalized anxiety disorder: Secondary | ICD-10-CM

## 2016-09-21 LAB — COMPREHENSIVE METABOLIC PANEL
ALT: 22 IU/L (ref 0–44)
AST: 19 IU/L (ref 0–40)
Albumin/Globulin Ratio: 1.9 (ref 1.2–2.2)
Albumin: 4.3 g/dL (ref 3.5–5.5)
Alkaline Phosphatase: 64 IU/L (ref 39–117)
BILIRUBIN TOTAL: 0.2 mg/dL (ref 0.0–1.2)
BUN/Creatinine Ratio: 13 (ref 9–20)
BUN: 10 mg/dL (ref 6–20)
CALCIUM: 9.2 mg/dL (ref 8.7–10.2)
CHLORIDE: 103 mmol/L (ref 96–106)
CO2: 24 mmol/L (ref 20–29)
Creatinine, Ser: 0.8 mg/dL (ref 0.76–1.27)
GFR calc non Af Amer: 115 mL/min/{1.73_m2} (ref >59)
GFR, EST AFRICAN AMERICAN: 133 mL/min/{1.73_m2} (ref >59)
GLUCOSE: 89 mg/dL (ref 65–99)
Globulin, Total: 2.3 g/dL (ref 1.5–4.5)
Potassium: 4.6 mmol/L (ref 3.5–5.2)
Sodium: 140 mmol/L (ref 134–144)
TOTAL PROTEIN: 6.6 g/dL (ref 6.0–8.5)

## 2016-09-21 LAB — CBC WITH DIFFERENTIAL/PLATELET
BASOS: 0 %
Basophils Absolute: 0 10*3/uL (ref 0.0–0.2)
EOS (ABSOLUTE): 0.2 10*3/uL (ref 0.0–0.4)
EOS: 3 %
HEMATOCRIT: 43.3 % (ref 37.5–51.0)
Hemoglobin: 15 g/dL (ref 13.0–17.7)
IMMATURE GRANS (ABS): 0 10*3/uL (ref 0.0–0.1)
IMMATURE GRANULOCYTES: 0 %
LYMPHS ABS: 2.1 10*3/uL (ref 0.7–3.1)
Lymphs: 30 %
MCH: 31.8 pg (ref 26.6–33.0)
MCHC: 34.6 g/dL (ref 31.5–35.7)
MCV: 92 fL (ref 79–97)
MONOCYTES: 8 %
Monocytes Absolute: 0.5 10*3/uL (ref 0.1–0.9)
NEUTROS ABS: 4.2 10*3/uL (ref 1.4–7.0)
Neutrophils: 59 %
Platelets: 247 10*3/uL (ref 150–379)
RBC: 4.72 x10E6/uL (ref 4.14–5.80)
RDW: 13.3 % (ref 12.3–15.4)
WBC: 7.1 10*3/uL (ref 3.4–10.8)

## 2016-09-21 LAB — TSH: TSH: 1.59 u[IU]/mL (ref 0.450–4.500)

## 2016-09-23 ENCOUNTER — Encounter: Payer: Self-pay | Admitting: Family Medicine

## 2016-09-26 ENCOUNTER — Ambulatory Visit: Payer: 59 | Admitting: Physician Assistant

## 2016-09-30 ENCOUNTER — Encounter: Payer: Self-pay | Admitting: Family Medicine

## 2016-10-14 DIAGNOSIS — G43709 Chronic migraine without aura, not intractable, without status migrainosus: Secondary | ICD-10-CM | POA: Diagnosis not present

## 2016-11-27 ENCOUNTER — Other Ambulatory Visit: Payer: Self-pay | Admitting: Physician Assistant

## 2016-11-27 DIAGNOSIS — G4700 Insomnia, unspecified: Secondary | ICD-10-CM

## 2016-11-27 NOTE — Telephone Encounter (Signed)
Refill req Trazodone Sent to [facility]

## 2016-11-27 NOTE — Telephone Encounter (Signed)
Sent to PCP ?

## 2016-12-01 ENCOUNTER — Encounter: Payer: Self-pay | Admitting: Family Medicine

## 2016-12-01 ENCOUNTER — Ambulatory Visit (INDEPENDENT_AMBULATORY_CARE_PROVIDER_SITE_OTHER): Payer: 59 | Admitting: Family Medicine

## 2016-12-01 VITALS — BP 143/85 | HR 111 | Temp 98.8°F | Resp 16 | Ht 67.25 in | Wt 184.0 lb

## 2016-12-01 DIAGNOSIS — R5382 Chronic fatigue, unspecified: Secondary | ICD-10-CM

## 2016-12-01 DIAGNOSIS — I10 Essential (primary) hypertension: Secondary | ICD-10-CM | POA: Diagnosis not present

## 2016-12-01 DIAGNOSIS — G43109 Migraine with aura, not intractable, without status migrainosus: Secondary | ICD-10-CM | POA: Diagnosis not present

## 2016-12-01 DIAGNOSIS — M545 Low back pain: Secondary | ICD-10-CM | POA: Diagnosis not present

## 2016-12-01 DIAGNOSIS — G8929 Other chronic pain: Secondary | ICD-10-CM | POA: Diagnosis not present

## 2016-12-01 DIAGNOSIS — G4700 Insomnia, unspecified: Secondary | ICD-10-CM

## 2016-12-01 DIAGNOSIS — E663 Overweight: Secondary | ICD-10-CM | POA: Diagnosis not present

## 2016-12-01 DIAGNOSIS — F411 Generalized anxiety disorder: Secondary | ICD-10-CM

## 2016-12-01 DIAGNOSIS — Z5181 Encounter for therapeutic drug level monitoring: Secondary | ICD-10-CM | POA: Diagnosis not present

## 2016-12-01 MED ORDER — LISINOPRIL 10 MG PO TABS: 10.0000 mg | ORAL_TABLET | Freq: Every day | ORAL | 0 refills | Status: DC

## 2016-12-01 MED ORDER — LEVOTHYROXINE SODIUM 25 MCG PO TABS: 25.0000 ug | ORAL_TABLET | Freq: Every day | ORAL | 2 refills | Status: DC

## 2016-12-01 MED ORDER — CLONAZEPAM 0.5 MG PO TABS: ORAL_TABLET | ORAL | 2 refills | Status: DC

## 2016-12-01 MED ORDER — BUTALBITAL-APAP-CAFF-COD 50-325-40-30 MG PO CAPS: 1.0000 | ORAL_CAPSULE | ORAL | 2 refills | Status: DC | PRN

## 2016-12-01 NOTE — Patient Instructions (Addendum)
IF you received an x-ray today, you will receive an invoice from [facility]. Please contact [facility] at [phone] with questions or concerns regarding your invoice.   IF you received labwork today, you will receive an invoice from [place]. Please contact [facility] at [phone] with questions or concerns regarding your invoice.   Our billing staff will not be able to assist you with questions regarding bills from these companies.  You will be contacted with the lab results as soon as they are available. The fastest way to get your results is to activate your My Chart account. Instructions are located on the last page of this paperwork. If you have not heard from [place] regarding the results in 2 weeks, please contact this office.    BELOW IS THE INFO YOU WOULD WANT TO WATCH OUT FOR IF THE SYNTHROID (THYROID SUPPLEMENT) WAS TO MUCH FOR YOUR BODY. IF YOU FEEL LIKE THAT - ANY WORSENING OF PALPITATIONS OR RAPID HEART BEAT ESPECIALLY - STOP THE LEVOTHYROXINE (SYNTHROID).  Hyperthyroidism Hyperthyroidism is when the thyroid is too active (overactive). Your thyroid is a large gland that is located in your neck. The thyroid helps to control how your body uses food (metabolism). When your thyroid is overactive, it produces too much of a hormone called thyroxine. What are the causes? Causes of hyperthyroidism may include:  Graves disease. This is when your immune system attacks the thyroid gland. This is the most common cause.  Inflammation of the thyroid gland.  Tumor in the thyroid gland or somewhere else.  Excessive use of thyroid medicines, including: ? Prescription thyroid supplement. ? Herbal supplements that mimic thyroid hormones.  Solid or fluid-filled lumps within your thyroid gland (thyroid nodules).  Excessive ingestion of iodine.  What increases the risk?  Being male.  Having a family history of thyroid conditions. What are the signs or  symptoms? Signs and symptoms of hyperthyroidism may include:  Nervousness.  Inability to tolerate heat.  Unexplained weight loss.  Diarrhea.  Change in the texture of hair or skin.  Heart skipping beats or making extra beats.  Rapid heart rate.  Loss of menstruation.  Shaky hands.  Fatigue.  Restlessness.  Increased appetite.  Sleep problems.  Enlarged thyroid gland or nodules.  How is this diagnosed? Diagnosis of hyperthyroidism may include:  Medical history and physical exam.  Blood tests.  Ultrasound tests.  How is this treated? Treatment may include:  Medicines to control your thyroid.  Surgery to remove your thyroid.  Radiation therapy.  Follow these instructions at home:  Take medicines only as directed by your health care provider.  Do not use any tobacco products, including cigarettes, chewing tobacco, or electronic cigarettes. If you need help quitting, ask your health care provider.  Do not exercise or do physical activity until your health care provider approves.  Keep all follow-up appointments as directed by your health care provider. This is important. Contact a health care provider if:  Your symptoms do not get better with treatment.  You have fever.  You are taking thyroid replacement medicine and you: ? Have depression. ? Feel mentally and physically slow. ? Have weight gain. Get help right away if:  You have decreased alertness or a change in your awareness.  You have abdominal pain.  You feel dizzy.  You have a rapid heartbeat.  You have an irregular heartbeat. This information is not intended to replace advice given to you by your health care provider. Make sure you  discuss any questions you have with your health care provider. Document Released: 02/10/2005 Document Revised: 07/12/2015 Document Reviewed: 06/28/2013 Elsevier Interactive Patient Education  2017 [employer].

## 2016-12-01 NOTE — Progress Notes (Signed)
Subjective:    Patient ID: Dalton Kelly, male    DOB: [birth year], 36 y.o.   MRN: [id number]  Chief Complaint  Patient presents with  . Medication Refill    FIORICET with CDN, KLONOPIN  . Hypertension    x 8 months    HPI  Dalton Kelly is a delightful 36 yo male here today for medication refills for his chronic medical conditions.  Elevated blood pressure: Monitors at home  Anxiety: States he had been on a stable regimen inc sched klonopin for good control of his lifelong anxiety sxs since 2009. His provider retired and he was being seen by a colleague who suggested that he try to minimize use of the multiple sedated controlled mult times daily meds that he was being rx'd which he did but felt his sxs begin to spiral out of control again - worsening anxiety led to a worsening of his migraines and other health problems. Stated that during the entire year that he was on lower doses of daily sedatives, he felt much worse/more sick and did continue to worsen throughout the year so not just w/d - w/ much more missed work - negative effects on personal and professional relationships.  Tried to transition to buspar from klonopin but at last visit we .  On cymbalta  Insomnia:  Migraines: Started at 36 yo and worsening. Seen by Neurology - Dr. Sima Kelly at [facility] - for botox and trigger point injections throughout shoulder, neck, forehead.  Topamax -> hair loss, nadolol -> fatigue, failed mult TCAs and triptans inc inj and nasal. Does use prn fioricet w/ codeine which works as long as he takes it immed when his HA starts. His mother as been on this regimen for 35 years and has also been stable without need for dose increase. His mother is also on nadolol and amitriptyline which he failed both.   Lumbago: Followed by orthopedics Dr. French Kelly and Dalton Kelly. Was on hydrocodone sparingly for prn back pain but due to new opioid guidelines, had to be changed to tramadol for pain control since he was on klonopin  though does not work as well for him.  On cymbalta.  Fatigue, lethargic, weight gain, skin - has been building up in the past 3 weeks.  Has not gotten work during today,  Waking up sleepy. Legs, face, hair. 3d last week, once this week, mood lower than normal.  Zapped.  Very severe over 3 weeks. Did mention sxs to Dr. Laney Kelly and things were normal. Both grandmother and mom have all been on synthroid since their 44s when he noticed his sxs starting. Has been waking up with HAs in there morning - 2nd dose things can get worse but by the 3rd or 4th (next in med Nov).   No h/o narcolepsy, hypersomnia.   Used to figure skate HR runs at 70 usually.  Intermittent hoarseness for the past sev wks.  Some congestion for the past wk   Worried about weight - DM in family - mother - she is worried he is going to get DM if he doesn't loose weight.  Has protein shake for breakfast, zone bar for lunch, and reasonable healthy dinner - lean meat and veggies.  Exercises by Dalton Kelly or elliptical at home for about 20 min 2x/wk but not very intense.  Past Medical History:  Diagnosis Date  . Anxiety   . Back pain   . Barbiturate dependence (Dalton Kelly)   . Episode of syncope    Recent  that sounds vasovagal  . Fatigue   . Generalized anxiety disorder   . Hair loss   . Insomnia   . Irregular bowel habits   . Migraines   . Opioid dependence in controlled environment (Dalton Kelly)   . Systolic click    Past Surgical History:  Procedure Laterality Date  . WISDOM TOOTH EXTRACTION     Current Outpatient Prescriptions on File Prior to Visit  Medication Sig Dispense Refill  . cyclobenzaprine (FLEXERIL) 5 MG tablet Take 5 mg by mouth 3 (three) times daily as needed.     . DULoxetine (CYMBALTA) 60 MG capsule Take 1 capsule (60 mg total) by mouth daily. 90 capsule 1  . traMADol (ULTRAM) 50 MG tablet Take by mouth every 6 (six) hours as needed.    . traZODone (DESYREL) 150 MG tablet TAKE 1/2 TABLET AT BEDTIME 45 tablet 1     No current facility-administered medications on file prior to visit.    Allergies  Allergen Reactions  . Ibuprofen     Abdominal pain  . Nsaids Nausea And Vomiting   Family History  Problem Relation Age of Onset  . Cancer Mother   . Hypertension Father   . Stroke Maternal Grandmother   . Alzheimer's disease Maternal Grandmother   . Cancer Maternal Grandfather   . Diabetes Maternal Grandfather   . Cancer Paternal Grandmother   . Heart failure Paternal Grandfather    Social History   Social History  . Marital status: Married    Spouse name: Dalton Kelly  . Number of children: N/A  . Years of education: N/A   Occupational History  . therapist     [employer]   Social History Main Topics  . Smoking status: Former Research scientist (life sciences)  . Smokeless tobacco: Never Used  . Alcohol use No  . Drug use: No  . Sexual activity: Not Asked   Other Topics Concern  . None   Social History Narrative   Mental Health therapist at [employer]    Married Dalton Kelly   Depression screen [facility] 2/9 12/01/2016 09/20/2016 06/26/2016 04/08/2016 03/28/2016  Decreased Interest 0 0 0 - 0  Down, Depressed, Hopeless 0 0 0 0 0  PHQ - 2 Score 0 0 0 0 0     Review of Systems  Constitutional: Positive for fatigue. Negative for activity change, appetite change, chills and fever.  HENT: Positive for voice change.   Eyes: Negative for visual disturbance.  Respiratory: Negative for shortness of breath.   Cardiovascular: Negative for chest pain and leg swelling.  Neurological: Positive for headaches. Negative for dizziness, syncope, facial asymmetry, weakness and light-headedness.       Objective:   Physical Exam  Constitutional: He is oriented to person, place, and time. He appears well-developed and well-nourished. No distress.  HENT:  Head: Normocephalic and atraumatic.  Eyes: Pupils are equal, round, and reactive to light. Conjunctivae are normal. No scleral icterus.  Neck: Normal range of motion. Neck supple. No  thyromegaly present.  Cardiovascular: Normal rate, regular rhythm, normal heart sounds and intact distal pulses.   Pulmonary/Chest: Effort normal and breath sounds normal. No respiratory distress.  Musculoskeletal: He exhibits no edema.  Lymphadenopathy:    He has no cervical adenopathy.  Neurological: He is alert and oriented to person, place, and time.  Skin: Skin is warm and dry. He is not diaphoretic.  Psychiatric: He has a normal mood and affect. His behavior is normal.      BP Marland Kitchen)  143/85 (BP Location: Left Arm, Patient Position: Sitting, Cuff Size: Normal)   Pulse (!) 111   Temp 98.8 F (37.1 C) (Oral)   Resp 16   Ht 5' 7.25" (1.708 m)   Wt 184 lb (83.5 kg)   SpO2 100%   BMI 28.60 kg/m      Assessment & Plan:  B12, cortisol, thyroid abx, test, esr/crp  1. Generalized anxiety disorder   2. Insomnia, unspecified type - on 37m trazodone qhs.  3. Chronic low back pain, unspecified back pain laterality, with sciatica presence unspecified - seeing ortho  4. Migraine with aura and without status migrainosus, not intractable - refilled fioricet - on #90/mo - seeing neurology for botox inj which so far are not helping  5. Medication monitoring encounter   6. GAD (generalized anxiety disorder) - refilled klonopin - same dose. Noted that fatigue complaints worsening since increasing klonopin but pt feels def not related.  7. Chronic fatigue - really wants to do trial of levothyroxine to augment his antidepressants. I am doubtful that it Dalton Kelly be effective but he really wants to try. Warned of hyperthyroid sxs to be alert from and stop med immed- need to recheck labs in 6-8 wks after starting levothyroxine supp 25 - no levothyroxine refill w/o labs. Do not start levothyroxine 25 until after get below labs drawn - need early fasting a.m. Labs. Encouraged pt to increase the intensity of exercise  8.      HTN - start lisinopril 10. Monitors BP outside of the office. Nadolol did not help  migraines and concern BB may exacerbate fatigue. If can't tol acei/arb, cons amlodipine. Did not want to try diuretic as limited bathroom breaks at work. 9.      Overweight  Orders Placed This Encounter  Procedures  . Vitamin B12    Standing Status:   Future    Number of Occurrences:   1    Standing Expiration Date:   12/01/2017  . TestT+TestF+SHBG    Standing Status:   Future    Number of Occurrences:   1    Standing Expiration Date:   12/01/2017  . Cortisol    Standing Status:   Future    Number of Occurrences:   1    Standing Expiration Date:   12/01/2017  . Sedimentation rate    Standing Status:   Future    Number of Occurrences:   1    Standing Expiration Date:   12/01/2017  . C-reactive protein    Standing Status:   Future    Number of Occurrences:   1    Standing Expiration Date:   12/01/2017  . VITAMIN D 25 Hydroxy (Vit-D Deficiency, Fractures)    Standing Status:   Future    Number of Occurrences:   1    Standing Expiration Date:   12/01/2017  . Thyroid Panel With TSH    Standing Status:   Future    Number of Occurrences:   1    Standing Expiration Date:   12/01/2017  . Thyroid antibodies    Standing Status:   Future    Number of Occurrences:   1    Standing Expiration Date:   12/01/2017    Meds ordered this encounter  Medications  . levothyroxine (SYNTHROID, LEVOTHROID) 25 MCG tablet    Sig: Take 1 tablet (25 mcg total) by mouth daily before breakfast.    Dispense:  30 tablet    Refill:  2  . butalbital-acetaminophen-caffeine (  FIORICET WITH CODEINE) 50-325-40-30 MG capsule    Sig: Take 1 capsule by mouth every 4 (four) hours as needed for headache.    Dispense:  90 capsule    Refill:  2  . clonazePAM (KLONOPIN) 0.5 MG tablet    Sig: Take 1 tab po qam and midday and 2 tabs po qhs    Dispense:  120 tablet    Refill:  2  . lisinopril (PRINIVIL,ZESTRIL) 10 MG tablet    Sig: Take 1 tablet (10 mg total) by mouth daily.    Dispense:  90 tablet    Refill:  0    Today I have utilized the [place] Controlled Substance Registry's online query to confirm compliance regarding the patient's controlled medications. My review reveals appropriate prescription fills and that I am the sole provider of these medications. Rechecks Dalton Kelly occur regularly and the patient is aware of our use of the system.  .Greater than 50% of the 40 minute visit was spent in counseling/coordination of care regarding chronic fatigue, potential thyroid supp in euthyroid setting.   Delman Cheadle, M.D.  Primary Care at [facility] [address] [place], [place] [address] [phone] phone [phone] fax  12/02/16 9:16 PM

## 2016-12-02 ENCOUNTER — Ambulatory Visit: Payer: 59 | Admitting: Urgent Care

## 2016-12-02 DIAGNOSIS — E663 Overweight: Secondary | ICD-10-CM | POA: Insufficient documentation

## 2016-12-02 DIAGNOSIS — R5382 Chronic fatigue, unspecified: Secondary | ICD-10-CM

## 2016-12-02 DIAGNOSIS — I10 Essential (primary) hypertension: Secondary | ICD-10-CM | POA: Insufficient documentation

## 2016-12-04 LAB — TESTT+TESTF+SHBG
Sex Hormone Binding: 101.4 nmol/L — ABNORMAL HIGH (ref 16.5–55.9)
TESTOSTERONE, TOTAL: 697.4 ng/dL (ref 264.0–916.0)
Testosterone, Free: 6.1 pg/mL — ABNORMAL LOW (ref 8.7–25.1)

## 2016-12-04 LAB — VITAMIN D 25 HYDROXY (VIT D DEFICIENCY, FRACTURES): VIT D 25 HYDROXY: 29.7 ng/mL — AB (ref 30.0–100.0)

## 2016-12-04 LAB — THYROID ANTIBODIES
THYROID PEROXIDASE ANTIBODY: 10 [IU]/mL (ref 0–34)
Thyroglobulin Antibody: <1 IU/mL (ref 0.0–0.9)

## 2016-12-04 LAB — THYROID PANEL WITH TSH
Free Thyroxine Index: 1.5 (ref 1.2–4.9)
T3 Uptake Ratio: 26 % (ref 24–39)
T4, Total: 5.8 ug/dL (ref 4.5–12.0)
TSH: 1.54 u[IU]/mL (ref 0.450–4.500)

## 2016-12-04 LAB — C-REACTIVE PROTEIN: CRP: 5.7 mg/L — AB (ref 0.0–4.9)

## 2016-12-04 LAB — VITAMIN B12: Vitamin B-12: 1056 pg/mL (ref 232–1245)

## 2016-12-04 LAB — SEDIMENTATION RATE: SED RATE: 2 mm/h (ref 0–15)

## 2016-12-04 LAB — CORTISOL: CORTISOL: 21.3 ug/dL

## 2016-12-08 ENCOUNTER — Encounter: Payer: Self-pay | Admitting: Family Medicine

## 2016-12-09 NOTE — Telephone Encounter (Signed)
PATIENT CALLED BACK TO SEE IF DR. SHAW HAS GOTTEN HIS LAB RESULTS BACK YET. HE SAID SHE KNOWS HE HAS BEEN OUT OF WORK FOR SOME TIME NOW AND IS WAITING ON THOSE RESULTS SO HE WILL KNOW WHAT TO DO NEXT. HE WOULD LIKE SOMEONE TO CALL HIM BACK AS SOON AS POSSIBLE SINCE DR. SHAW IS NOT IN THE OFFICE. BEST PHONE [phone] (CELL) MBC

## 2016-12-15 ENCOUNTER — Ambulatory Visit: Payer: 59 | Admitting: Family Medicine

## 2016-12-24 ENCOUNTER — Ambulatory Visit: Payer: 59 | Admitting: Family Medicine

## 2017-01-07 ENCOUNTER — Other Ambulatory Visit: Payer: Self-pay

## 2017-01-07 ENCOUNTER — Ambulatory Visit (INDEPENDENT_AMBULATORY_CARE_PROVIDER_SITE_OTHER): Payer: 59 | Admitting: Family Medicine

## 2017-01-07 ENCOUNTER — Encounter: Payer: Self-pay | Admitting: Family Medicine

## 2017-01-07 DIAGNOSIS — G43109 Migraine with aura, not intractable, without status migrainosus: Secondary | ICD-10-CM | POA: Diagnosis not present

## 2017-01-07 MED ORDER — PROMETHAZINE HCL 25 MG PO TABS
25.0000 mg | ORAL_TABLET | Freq: Three times a day (TID) | ORAL | 2 refills | Status: DC | PRN
Start: 2017-01-07 — End: 2017-03-11

## 2017-01-07 MED ORDER — BUTALBITAL-APAP-CAFF-COD 50-325-40-30 MG PO CAPS: 1.0000 | ORAL_CAPSULE | Freq: Four times a day (QID) | ORAL | 0 refills | Status: DC | PRN

## 2017-01-07 NOTE — Progress Notes (Signed)
Subjective:    Patient ID: Dalton Kelly, male    DOB: 09-17-1980, 36 y.o.   MRN: [id number] Chief Complaint  Patient presents with  . labwork  . Migraines    have them almost everyday    HPI Migraines have been worse. Has seen 8-9 neurologist since he was 36 yo.  Has 24-25/mo.  Used to be on midrin which worked.  Used to be on fioricet with codeine. He did try botox recently on recommendations of [facility]. He is set up for his 3rd botox treatment next week - he has been told Had to come into the hosp twice with the 1st one, immediately after with the second one, he had horrible HAs but was able to stay out.  He was told that sometimes after the 3rd one they kick in and work. Today he woke up with one and if he doesn't get to treat it immediately it will worsen.  He has had his holidays ruined before.    He tried the numbing nasal and the numbing head treatments that didn't work.  Leaving on the 22 for family trip and 3rd botox is sched for 20th.  Is on a wait list for amovig - first biologic.  Used to be on promethazine as does get N/V with the migraines.   Dr. Mitchel HonourHigin is the neurologist. Dr. Jannifer FranklinAkintayo and recommended up to 6 tabs/d max  Feels like the synthroid is working.   Have noticed an improvement in energy in the past week.  Took 3-4 wks off of work to heal and replace. BP 120/70 at home.  No side effects from the lisinopril. In general sleeping better.   Super-beets and mega-red, estra-C, emergency-C combo drink so stopped that.    Will have carnation , no a lot of soy, zone bar.   No hyperthyroid sxs.   Past Medical History:  Diagnosis Date  . Anxiety   . Back pain   . Barbiturate dependence (HCC)   . Episode of syncope    Recent that sounds vasovagal  . Fatigue   . Generalized anxiety disorder   . Hair loss   . Insomnia   . Irregular bowel habits   . Migraines   . Opioid dependence in controlled environment (HCC)   . Systolic click    Past Surgical History:    Procedure Laterality Date  . WISDOM TOOTH EXTRACTION     Current Outpatient Medications on File Prior to Visit  Medication Sig Dispense Refill  . clonazePAM (KLONOPIN) 0.5 MG tablet Take 1 tab po qam and midday and 2 tabs po qhs 120 tablet 2  . cyclobenzaprine (FLEXERIL) 5 MG tablet Take 5 mg by mouth 3 (three) times daily as needed.     . DULoxetine (CYMBALTA) 60 MG capsule Take 1 capsule (60 mg total) by mouth daily. 90 capsule 1  . levothyroxine (SYNTHROID, LEVOTHROID) 25 MCG tablet Take 1 tablet (25 mcg total) by mouth daily before breakfast. 30 tablet 2  . lisinopril (PRINIVIL,ZESTRIL) 10 MG tablet Take 1 tablet (10 mg total) by mouth daily. 90 tablet 0  . traMADol (ULTRAM) 50 MG tablet Take by mouth every 6 (six) hours as needed.    . traZODone (DESYREL) 150 MG tablet TAKE 1/2 TABLET AT BEDTIME 45 tablet 1   No current facility-administered medications on file prior to visit.    Allergies  Allergen Reactions  . Ibuprofen     Abdominal pain  . Nsaids Nausea And Vomiting   Family  History  Problem Relation Age of Onset  . Cancer Mother   . Hypertension Father   . Stroke Maternal Grandmother   . Alzheimer's disease Maternal Grandmother   . Cancer Maternal Grandfather   . Diabetes Maternal Grandfather   . Cancer Paternal Grandmother   . Heart failure Paternal Grandfather    Social History   Socioeconomic History  . Marital status: Married    Spouse name: Onalee HuaDavid  . Number of children: None  . Years of education: None  . Highest education level: None  Social Needs  . Financial resource strain: None  . Food insecurity - worry: None  . Food insecurity - inability: None  . Transportation needs - medical: None  . Transportation needs - non-medical: None  Occupational History  . Occupation: therapist    Comment: [employer]  Tobacco Use  . Smoking status: Former Games developermoker  . Smokeless tobacco: Never Used  Substance and Sexual Activity  . Alcohol use: No    Alcohol/week:  0.0 oz  . Drug use: No  . Sexual activity: None  Other Topics Concern  . None  Social History Narrative   Mental Health therapist at [employer]    Married - Onalee HuaDavid     Review of Systems SEE HPI    Objective:   Physical Exam  Constitutional: He is oriented to person, place, and time. He appears well-developed and well-nourished. No distress.  HENT:  Head: Normocephalic and atraumatic.  Eyes: No scleral icterus.  Pulmonary/Chest: Effort normal.  Neurological: He is alert and oriented to person, place, and time.  Skin: Skin is warm and dry. He is not diaphoretic.  Psychiatric: He has a normal mood and affect. His behavior is normal.      BP 113/77   Pulse 99   Temp 97.6 F (36.4 C) (Oral)   Resp 16   Ht 5\' 7"  (1.702 m)   Wt 182 lb 12.8 oz (82.9 kg)   SpO2 100%   BMI 28.63 kg/m   Assessment & Plan:   1. Migraine with aura and without status migrainosus, not intractable      Meds ordered this encounter  Medications  . butalbital-acetaminophen-caffeine (FIORICET WITH CODEINE) 50-325-40-30 MG capsule    Sig: Take 1-2 capsules every 6 (six) hours as needed by mouth for headache. Do not take more than 6 tabs/d.    Dispense:  180 capsule    Refill:  0  . promethazine (PHENERGAN) 25 MG tablet    Sig: Take 1 tablet (25 mg total) every 8 (eight) hours as needed by mouth for nausea or vomiting (migraine headache).    Dispense:  20 tablet    Refill:  2    I personally performed the services described in this documentation, which was scribed in my presence. The recorded information has been reviewed and considered, and addended by me as needed.   Norberto SorensonEva Shaw, M.D.  [facility] at [facility]  [place] [address] [place], [place] [address] [phone] phone [phone] fax  01/10/17 6:31 AM

## 2017-01-07 NOTE — Patient Instructions (Signed)
     IF you received an x-ray today, you will receive an invoice from [facility]. Please contact  Radiology at [phone] with questions or concerns regarding your invoice.   IF you received labwork today, you will receive an invoice from [facility]. Please contact [facility] at [phone] with questions or concerns regarding your invoice.   Our billing staff will not be able to assist you with questions regarding bills from these companies.  You will be contacted with the lab results as soon as they are available. The fastest way to get your results is to activate your My Chart account. Instructions are located on the last page of this paperwork. If you have not heard from us regarding the results in 2 weeks, please contact this office.     

## 2017-01-20 DIAGNOSIS — G43709 Chronic migraine without aura, not intractable, without status migrainosus: Secondary | ICD-10-CM | POA: Diagnosis not present

## 2017-01-26 ENCOUNTER — Ambulatory Visit (INDEPENDENT_AMBULATORY_CARE_PROVIDER_SITE_OTHER): Payer: 59 | Admitting: Family Medicine

## 2017-01-26 DIAGNOSIS — R779 Abnormality of plasma protein, unspecified: Secondary | ICD-10-CM

## 2017-01-26 DIAGNOSIS — Z5181 Encounter for therapeutic drug level monitoring: Secondary | ICD-10-CM | POA: Diagnosis not present

## 2017-01-26 DIAGNOSIS — R5382 Chronic fatigue, unspecified: Secondary | ICD-10-CM

## 2017-01-26 DIAGNOSIS — R7989 Other specified abnormal findings of blood chemistry: Secondary | ICD-10-CM | POA: Diagnosis not present

## 2017-01-26 DIAGNOSIS — E559 Vitamin D deficiency, unspecified: Secondary | ICD-10-CM

## 2017-01-29 ENCOUNTER — Encounter: Payer: Self-pay | Admitting: Family Medicine

## 2017-01-29 ENCOUNTER — Other Ambulatory Visit: Payer: Self-pay

## 2017-01-29 ENCOUNTER — Ambulatory Visit (INDEPENDENT_AMBULATORY_CARE_PROVIDER_SITE_OTHER): Payer: 59 | Admitting: Family Medicine

## 2017-01-29 VITALS — BP 108/60 | HR 105 | Temp 98.5°F | Resp 16 | Ht 67.0 in | Wt 187.0 lb

## 2017-01-29 DIAGNOSIS — R5382 Chronic fatigue, unspecified: Secondary | ICD-10-CM

## 2017-01-29 DIAGNOSIS — R779 Abnormality of plasma protein, unspecified: Secondary | ICD-10-CM | POA: Diagnosis not present

## 2017-01-29 DIAGNOSIS — G4700 Insomnia, unspecified: Secondary | ICD-10-CM | POA: Diagnosis not present

## 2017-01-29 DIAGNOSIS — G43109 Migraine with aura, not intractable, without status migrainosus: Secondary | ICD-10-CM | POA: Diagnosis not present

## 2017-01-29 DIAGNOSIS — E038 Other specified hypothyroidism: Secondary | ICD-10-CM

## 2017-01-29 DIAGNOSIS — I10 Essential (primary) hypertension: Secondary | ICD-10-CM | POA: Diagnosis not present

## 2017-01-29 DIAGNOSIS — E039 Hypothyroidism, unspecified: Secondary | ICD-10-CM | POA: Diagnosis not present

## 2017-01-29 DIAGNOSIS — F411 Generalized anxiety disorder: Secondary | ICD-10-CM | POA: Diagnosis not present

## 2017-01-29 DIAGNOSIS — E559 Vitamin D deficiency, unspecified: Secondary | ICD-10-CM | POA: Diagnosis not present

## 2017-01-29 DIAGNOSIS — Z5181 Encounter for therapeutic drug level monitoring: Secondary | ICD-10-CM | POA: Diagnosis not present

## 2017-01-29 MED ORDER — BUTALBITAL-APAP-CAFF-COD 50-325-40-30 MG PO CAPS: 1.0000 | ORAL_CAPSULE | Freq: Four times a day (QID) | ORAL | 2 refills | Status: DC | PRN

## 2017-01-29 MED ORDER — LEVOTHYROXINE SODIUM 25 MCG PO TABS: 25.0000 ug | ORAL_TABLET | Freq: Every day | ORAL | 1 refills | Status: DC

## 2017-01-29 MED ORDER — LISINOPRIL 10 MG PO TABS: 5.0000 mg | ORAL_TABLET | Freq: Every day | ORAL | 0 refills | Status: DC

## 2017-01-29 MED ORDER — VITAMIN D (ERGOCALCIFEROL) 1.25 MG (50000 UNIT) PO CAPS: 50000.0000 [IU] | ORAL_CAPSULE | ORAL | 0 refills | Status: DC

## 2017-01-29 MED ORDER — DULOXETINE HCL 20 MG PO CPEP: 20.0000 mg | ORAL_CAPSULE | Freq: Every day | ORAL | 0 refills | Status: DC

## 2017-01-29 MED ORDER — CLONAZEPAM 0.5 MG PO TABS: ORAL_TABLET | ORAL | 2 refills | Status: DC

## 2017-01-29 NOTE — Progress Notes (Signed)
Lab only visit, not seen by provider

## 2017-01-29 NOTE — Patient Instructions (Addendum)
Lets cut your lisinopril in half so you are only taking 5mg  a day.     IF you received an x-ray today, you will receive an invoice from [facility]. Please contact [facility] at [phone] with questions or concerns regarding your invoice.   IF you received labwork today, you will receive an invoice from [facility]. Please contact [facility] at [phone] with questions or concerns regarding your invoice.   Our billing staff will not be able to assist you with questions regarding bills from these companies.  You will be contacted with the lab results as soon as they are available. The fastest way to get your results is to activate your My Chart account. Instructions are located on the last page of this paperwork. If you have not heard from us regarding the results in 2 weeks, please contact this office.

## 2017-01-29 NOTE — Progress Notes (Signed)
Patient ID: Dalton Kelly, male   DOB: [birth year], 36 y.o.   MRN: [id number]   Subjective:    Patient ID: Dalton Kelly, male    DOB: [birth year], 36 y.o.   MRN: [id number] Chief Complaint  Patient presents with  . Follow-up    thyroid    HPI   He went ahead and deferred the 3rd botox shot as it might really worsen sxs.  He has started taking Ajovi - the biologic - administerd once a month during the trial.  Migraines have been worse. Has seen 8-9 neurologist since he was 36 yo.  Has 24-25/mo.  Used to be on midrin which worked.  Used to be on fioricet with codeine. He did try botox recently on recommendations of [facility]. He is set up for his 3rd botox treatment next week - he has been told Had to come into the hosp twice with the 1st one, immediately after with the second one, he had horrible HAs but was able to stay out.  He was told that sometimes after the 3rd one they kick in and work. Today he woke up with one and if he doesn't get to treat it immediately it will worsen.  He has had his holidays ruined before.    He tried the numbing nasal and the numbing head treatments that didn't work.  Leaving on the 22 for family trip and 3rd botox is sched for 20th.  Is on a wait list for amovig - first biologic.  Used to be on promethazine as does get N/V with the migraines.   Dr. Mitchel HonourHigin is the neurologist. Dr. Jannifer FranklinAkintayo and recommended up to 6 tabs/d max   Going back to work tomorrow.  BP 120/70 at home.  No side effects from the lisinopril. In general sleeping better.   Super-beets and mega-red, estra-C, emergency-C combo drink so stopped that.    Will have carnation , no a lot of soy, zone bar.   No hyperthyroid sxs. Definitely more energy on the synthroid. Insurance won't cover 30d supply - but only 90d supply.  Past Medical History:  Diagnosis Date  . Anxiety   . Back pain   . Barbiturate dependence (HCC)   . Episode of syncope    Recent that sounds vasovagal  . Fatigue   .  Generalized anxiety disorder   . Hair loss   . Insomnia   . Irregular bowel habits   . Migraines   . Opioid dependence in controlled environment (HCC)   . Systolic click    Past Surgical History:  Procedure Laterality Date  . WISDOM TOOTH EXTRACTION     Current Outpatient Medications on File Prior to Visit  Medication Sig Dispense Refill  . AJOVY 225 MG/1.5ML SOSY     . butalbital-acetaminophen-caffeine (FIORICET WITH CODEINE) 50-325-40-30 MG capsule Take 1-2 capsules every 6 (six) hours as needed by mouth for headache. Do not take more than 6 tabs/d. 180 capsule 0  . clonazePAM (KLONOPIN) 0.5 MG tablet Take 1 tab po qam and midday and 2 tabs po qhs 120 tablet 2  . cyclobenzaprine (FLEXERIL) 5 MG tablet Take 5 mg by mouth 3 (three) times daily as needed.     . DULoxetine (CYMBALTA) 60 MG capsule Take 1 capsule (60 mg total) by mouth daily. 90 capsule 1  . levothyroxine (SYNTHROID, LEVOTHROID) 25 MCG tablet Take 1 tablet (25 mcg total) by mouth daily before breakfast. 30 tablet 2  . lisinopril (PRINIVIL,ZESTRIL) 10 MG tablet Take 1  tablet (10 mg total) by mouth daily. 90 tablet 0  . promethazine (PHENERGAN) 25 MG tablet Take 1 tablet (25 mg total) every 8 (eight) hours as needed by mouth for nausea or vomiting (migraine headache). 20 tablet 2  . traMADol (ULTRAM) 50 MG tablet Take by mouth every 6 (six) hours as needed.    . traZODone (DESYREL) 150 MG tablet TAKE 1/2 TABLET AT BEDTIME 45 tablet 1   No current facility-administered medications on file prior to visit.    Allergies  Allergen Reactions  . Ibuprofen     Abdominal pain  . Nsaids Nausea And Vomiting   Family History  Problem Relation Age of Onset  . Cancer Mother   . Hypertension Father   . Stroke Maternal Grandmother   . Alzheimer's disease Maternal Grandmother   . Cancer Maternal Grandfather   . Diabetes Maternal Grandfather   . Cancer Paternal Grandmother   . Heart failure Paternal Grandfather    Social History    Socioeconomic History  . Marital status: Married    Spouse name: [name]  . Number of children: None  . Years of education: None  . Highest education level: None  Social Needs  . Financial resource strain: None  . Food insecurity - worry: None  . Food insecurity - inability: None  . Transportation needs - medical: None  . Transportation needs - non-medical: None  Occupational History  . Occupation: therapist    Comment: [employer]  Tobacco Use  . Smoking status: Former Games developer  . Smokeless tobacco: Never Used  Substance and Sexual Activity  . Alcohol use: No    Alcohol/week: 0.0 oz  . Drug use: No  . Sexual activity: None  Other Topics Concern  . None  Social History Narrative   Mental Health therapist at [employer]    Married [name]   Depression screen [facility] 2/9 01/29/2017 01/29/2017 01/07/2017 12/01/2016 09/20/2016  Decreased Interest 0 0 0 0 0  Down, Depressed, Hopeless 0 0 0 0 0  PHQ - 2 Score 0 0 0 0 0     Review of Systems SEE HPI    Objective:   Physical Exam  Constitutional: He is oriented to person, place, and time. He appears well-developed and well-nourished. No distress.  HENT:  Head: Normocephalic and atraumatic.  Eyes: No scleral icterus.  Pulmonary/Chest: Effort normal.  Neurological: He is alert and oriented to person, place, and time.  Skin: Skin is warm and dry. He is not diaphoretic.  Psychiatric: He has a normal mood and affect. His behavior is normal.      BP 108/60   Pulse (!) 105   Temp 98.5 F (36.9 C)   Resp 16   Ht 5\' 7"  (1.702 m)   Wt 187 lb (84.8 kg)   SpO2 100%   BMI 29.29 kg/m   Assessment & Plan:   Taking vitamin D supplement 3000 bid. Try once weekly high dose vit D. Cut vit D in half. Going to start figure skating.  Worried about his weight gain.  Considering coming off of cymbalta as concerned about weight gain.  Could not tolerate topamax - had twitching and hair loss.  Has 90d supply of everything t He will be  due to a refill    1. Chronic fatigue   2. Vitamin D deficiency   3. High serum protein level   4. Subclinical hypothyroidism   5. Migraine with aura and without status migrainosus, not intractable  6. Essential hypertension   7. Generalized anxiety disorder   8. Insomnia, unspecified type   9. GAD (generalized anxiety disorder)      Meds ordered this encounter  Medications  . levothyroxine (SYNTHROID, LEVOTHROID) 25 MCG tablet    Sig: Take 1 tablet (25 mcg total) by mouth daily before breakfast.    Dispense:  90 tablet    Refill:  1  . Vitamin D, Ergocalciferol, (DRISDOL) 50000 units CAPS capsule    Sig: Take 1 capsule (50,000 Units total) by mouth every 7 (seven) days.    Dispense:  24 capsule    Refill:  0  . DULoxetine (CYMBALTA) 20 MG capsule    Sig: Take 1 capsule (20 mg total) by mouth daily. As instructed by provider to taper off.    Dispense:  90 capsule    Refill:  0    D/c other rxs for the cymbalta.  Marland Kitchen. DISCONTD: butalbital-acetaminophen-caffeine (FIORICET WITH CODEINE) 50-325-40-30 MG capsule    Sig: Take 1 capsule by mouth every 6 (six) hours as needed for headache or migraine. Do not take more than 6 tabs/d.    Dispense:  120 capsule    Refill:  2  . clonazePAM (KLONOPIN) 0.5 MG tablet    Sig: Take 1 tab po qam and midday and 2 tabs po qhs    Dispense:  120 tablet    Refill:  2  . lisinopril (PRINIVIL,ZESTRIL) 10 MG tablet    Sig: Take 0.5 tablets (5 mg total) by mouth daily.    Dispense:  45 tablet    Refill:  0  . butalbital-acetaminophen-caffeine (FIORICET WITH CODEINE) 50-325-40-30 MG capsule    Sig: Take 1-2 capsules by mouth every 6 (six) hours as needed for headache or migraine. Do not take more than 6 tabs/d.    Dispense:  120 capsule    Refill:  2    May refill no sooner than every 28 days.   Norberto SorensonEva Mercedies Ganesh, M.D.  [facility] at [facility]  [place] [address] [place], [place] [address] [phone] phone [phone] fax  01/29/17 11:39  AM

## 2017-02-02 LAB — CBC WITH DIFFERENTIAL/PLATELET
BASOS ABS: 0 10*3/uL (ref 0.0–0.2)
Basos: 0 %
EOS (ABSOLUTE): 0.3 10*3/uL (ref 0.0–0.4)
Eos: 4 %
Hematocrit: 44.5 % (ref 37.5–51.0)
Hemoglobin: 14.9 g/dL (ref 13.0–17.7)
IMMATURE GRANS (ABS): 0 10*3/uL (ref 0.0–0.1)
Immature Granulocytes: 0 %
LYMPHS: 42 %
Lymphocytes Absolute: 3.1 10*3/uL (ref 0.7–3.1)
MCH: 31.2 pg (ref 26.6–33.0)
MCHC: 33.5 g/dL (ref 31.5–35.7)
MCV: 93 fL (ref 79–97)
MONOCYTES: 6 %
Monocytes Absolute: 0.5 10*3/uL (ref 0.1–0.9)
NEUTROS ABS: 3.5 10*3/uL (ref 1.4–7.0)
Neutrophils: 48 %
PLATELETS: 221 10*3/uL (ref 150–379)
RBC: 4.77 x10E6/uL (ref 4.14–5.80)
RDW: 12.8 % (ref 12.3–15.4)
WBC: 7.3 10*3/uL (ref 3.4–10.8)

## 2017-02-02 LAB — COMPREHENSIVE METABOLIC PANEL
A/G RATIO: 2.1 (ref 1.2–2.2)
ALK PHOS: 71 IU/L (ref 39–117)
ALT: 16 IU/L (ref 0–44)
AST: 15 IU/L (ref 0–40)
Albumin: 4.5 g/dL (ref 3.5–5.5)
BUN/Creatinine Ratio: 11 (ref 9–20)
BUN: 9 mg/dL (ref 6–20)
Bilirubin Total: <0.2 mg/dL (ref 0.0–1.2)
CALCIUM: 9.2 mg/dL (ref 8.7–10.2)
CHLORIDE: 103 mmol/L (ref 96–106)
CO2: 24 mmol/L (ref 20–29)
Creatinine, Ser: 0.83 mg/dL (ref 0.76–1.27)
GFR calc non Af Amer: 112 mL/min/{1.73_m2} (ref >59)
GFR, EST AFRICAN AMERICAN: 130 mL/min/{1.73_m2} (ref >59)
GLUCOSE: 91 mg/dL (ref 65–99)
Globulin, Total: 2.1 g/dL (ref 1.5–4.5)
POTASSIUM: 4.1 mmol/L (ref 3.5–5.2)
Sodium: 144 mmol/L (ref 134–144)
Total Protein: 6.6 g/dL (ref 6.0–8.5)

## 2017-02-02 LAB — LIPID PANEL
CHOL/HDL RATIO: 4.2 ratio (ref 0.0–5.0)
Cholesterol, Total: 215 mg/dL — ABNORMAL HIGH (ref 100–199)
HDL: 51 mg/dL (ref >39)
LDL CALC: 124 mg/dL — AB (ref 0–99)
Triglycerides: 198 mg/dL — ABNORMAL HIGH (ref 0–149)
VLDL Cholesterol Cal: 40 mg/dL (ref 5–40)

## 2017-02-02 LAB — ESTRADIOL, FREE
ESTRADIOL: 21 pg/mL
Free Estradiol, Percent: 2.2 %
Free Estradiol, Serum: 0.46 pg/mL

## 2017-02-02 LAB — TESTT+TESTF+SHBG
Sex Hormone Binding: 70 nmol/L — ABNORMAL HIGH (ref 16.5–55.9)
Testosterone, Free: 11 pg/mL (ref 8.7–25.1)
Testosterone, total: 814.1 ng/dL (ref 264.0–916.0)

## 2017-02-02 LAB — HEPATITIS PANEL, ACUTE
HEP A IGM: NEGATIVE
HEP B S AG: NEGATIVE
Hep B C IgM: NEGATIVE
Hep C Virus Ab: <0.1 s/co ratio (ref 0.0–0.9)

## 2017-02-02 LAB — CORTISOL: CORTISOL: 20.4 ug/dL

## 2017-02-02 LAB — TSH+T4F+T3FREE
Free T4: 1.17 ng/dL (ref 0.82–1.77)
T3, Free: 2.8 pg/mL (ref 2.0–4.4)
TSH: 0.53 u[IU]/mL (ref 0.450–4.500)

## 2017-02-02 LAB — VITAMIN D 25 HYDROXY (VIT D DEFICIENCY, FRACTURES): VIT D 25 HYDROXY: 28.9 ng/mL — AB (ref 30.0–100.0)

## 2017-02-02 LAB — CORTISOL, FREE: Cortisol, Free Dialysis, LCMS: 1.11 ug/dL

## 2017-02-02 LAB — C-REACTIVE PROTEIN: CRP: 1.3 mg/L (ref 0.0–4.9)

## 2017-02-06 LAB — SALIVARY CORTISOL X2, TIMED
Salivary Cortisol 2nd Specimen: 0.01 ug/dL
Salivary Cortisol Baseline: 0.018 ug/dL

## 2017-02-26 ENCOUNTER — Other Ambulatory Visit: Payer: Self-pay | Admitting: Family Medicine

## 2017-03-03 DIAGNOSIS — M545 Low back pain: Secondary | ICD-10-CM | POA: Diagnosis not present

## 2017-03-08 DIAGNOSIS — J111 Influenza due to unidentified influenza virus with other respiratory manifestations: Secondary | ICD-10-CM | POA: Diagnosis not present

## 2017-03-11 ENCOUNTER — Encounter: Payer: Self-pay | Admitting: Family Medicine

## 2017-03-11 ENCOUNTER — Ambulatory Visit (INDEPENDENT_AMBULATORY_CARE_PROVIDER_SITE_OTHER): Payer: 59 | Admitting: Family Medicine

## 2017-03-11 VITALS — BP 131/88 | HR 97 | Temp 99.1°F | Resp 18 | Ht 67.0 in | Wt 185.8 lb

## 2017-03-11 DIAGNOSIS — J111 Influenza due to unidentified influenza virus with other respiratory manifestations: Secondary | ICD-10-CM | POA: Diagnosis not present

## 2017-03-11 DIAGNOSIS — R1030 Lower abdominal pain, unspecified: Secondary | ICD-10-CM | POA: Diagnosis not present

## 2017-03-11 LAB — POCT CBC
GRANULOCYTE PERCENT: 65.2 % (ref 37–80)
HEMATOCRIT: 39.3 % — AB (ref 43.5–53.7)
Hemoglobin: 13.7 g/dL — AB (ref 14.1–18.1)
Lymph, poc: 2.1 (ref 0.6–3.4)
MCH: 31.9 pg — AB (ref 27–31.2)
MCHC: 34.7 g/dL (ref 31.8–35.4)
MCV: 91.9 fL (ref 80–97)
MID (CBC): 0.4 (ref 0–0.9)
MPV: 6.8 fL (ref 0–99.8)
PLATELET COUNT, POC: 203 10*3/uL (ref 142–424)
POC GRANULOCYTE: 4.7 (ref 2–6.9)
POC LYMPH %: 28.8 % (ref 10–50)
POC MID %: 6 % (ref 0–12)
RBC: 4.28 M/uL — AB (ref 4.69–6.13)
RDW, POC: 12.6 %
WBC: 7.2 10*3/uL (ref 4.6–10.2)

## 2017-03-11 LAB — POCT URINALYSIS DIP (MANUAL ENTRY)
BILIRUBIN UA: NEGATIVE
GLUCOSE UA: NEGATIVE mg/dL
Leukocytes, UA: NEGATIVE
Nitrite, UA: NEGATIVE
Protein Ur, POC: NEGATIVE mg/dL
RBC UA: NEGATIVE
UROBILINOGEN UA: 0.2 U/dL
pH, UA: 5.5 (ref 5.0–8.0)

## 2017-03-11 MED ORDER — HYDROCOD POLST-CPM POLST ER 10-8 MG/5ML PO SUER: 5.0000 mL | Freq: Two times a day (BID) | ORAL | 0 refills | Status: DC | PRN

## 2017-03-11 MED ORDER — PROMETHAZINE HCL 25 MG PO TABS: 25.0000 mg | ORAL_TABLET | Freq: Three times a day (TID) | ORAL | 2 refills | Status: DC | PRN

## 2017-03-11 NOTE — Patient Instructions (Addendum)
Vomiting, Adult Vomiting occurs when stomach contents are thrown up and out of the mouth. Many people notice nausea before vomiting. Vomiting can make you feel weak and dehydrated. Dehydration can make you tired and thirsty, cause you to have a dry mouth, and decrease how often you urinate. Older adults and people who have other diseases or a weak immune system are at higher risk for dehydration.It is important to treat vomiting as told by your health care provider. Follow these instructions at home: Follow your health care provider's instructions about how to care for yourself at home. Eating and drinking Follow these recommendations as told by your health care provider:  Take an oral rehydration solution (ORS). This is a drink that is sold at pharmacies and retail stores.  Eat bland, easy-to-digest foods in small amounts as you are able. These foods include bananas, applesauce, rice, lean meats, toast, and crackers.  Drink clear fluids in small amounts as you are able. Clear fluids include water, ice chips, low-calorie sports drinks, and fruit juice that has water added (diluted fruit juice).  Avoid fluids that contain a lot of sugar or caffeine.  Avoid alcohol and foods that are spicy or fatty.  General instructions   Wash your hands frequently with soap and water. If soap and water are not available, use hand sanitizer. Make sure that everyone in your household washes their hands frequently.  Take over-the-counter and prescription medicines only as told by your health care provider.  Watch your condition for any changes.  Keep all follow-up visits as told by your health care provider. This is important. Contact a health care provider if:  You have a fever.  You are not able to keep fluids down.  Your vomiting gets worse.  You have new symptoms.  You feel light-headed or dizzy.  You have a headache.  You have muscle cramps. Get help right away if:  You have pain in  your chest, neck, arm, or jaw.  You feel extremely weak or you faint.  You have persistent vomiting.  You have vomit that is bright red or looks like black coffee grounds.  You have stools that are bloody or black, or stools that look like tar.  You have severe pain, cramping, or bloating in your abdomen.  You have a severe headache, a stiff neck, or both.  You have a rash.  You have trouble breathing or you are breathing very quickly.  Your heart is beating very quickly.  Your skin feels cold and clammy.  You feel confused.  You have pain while urinating.  You have signs of dehydration, such as: ? Dark urine, or very little or no urine. ? Cracked lips. ? Dry mouth. ? Sunken eyes. ? Sleepiness. ? Weakness. These symptoms may represent a serious problem that is an emergency. Do not wait to see if the symptoms will go away. Get medical help right away. Call your local emergency services (911 in the [place] Do not drive yourself to the hospital. This information is not intended to replace advice given to you by your health care provider. Make sure you discuss any questions you have with your health care provider. Document Released: 03/09/2015 Document Revised: 07/19/2015 Document Reviewed: 10/17/2014 Elsevier Interactive Patient Education  2018 [employer]    IF you received an x-ray today, you will receive an invoice from [facility]. Please contact [facility] at [phone] with questions or concerns regarding your invoice.   IF you received labwork today, you will  receive an invoice from [employer]. Please contact [facility] at [phone] with questions or concerns regarding your invoice.   Our billing staff will not be able to assist you with questions regarding bills from these companies.  You will be contacted with the lab results as soon as they are available. The fastest way to get your results is to activate your My Chart account. Instructions  are located on the last page of this paperwork. If you have not heard from [place] regarding the results in 2 weeks, please contact this office.      Influenza, Adult Influenza, more commonly known as "the flu," is a viral infection that primarily affects the respiratory tract. The respiratory tract includes organs that help you breathe, such as the lungs, nose, and throat. The flu causes many common cold symptoms, as well as a high fever and body aches. The flu spreads easily from person to person (is contagious). Getting a flu shot (influenza vaccination) every year is the best way to prevent influenza. What are the causes? Influenza is caused by a virus. You can catch the virus by:  Breathing in droplets from an infected person's cough or sneeze.  Touching something that was recently contaminated with the virus and then touching your mouth, nose, or eyes.  What increases the risk? The following factors may make you more likely to get the flu:  Not cleaning your hands frequently with soap and water or alcohol-based hand sanitizer.  Having close contact with many people during cold and flu season.  Touching your mouth, eyes, or nose without washing or sanitizing your hands first.  Not drinking enough fluids or not eating a healthy diet.  Not getting enough sleep or exercise.  Being under a high amount of stress.  Not getting a yearly (annual) flu shot.  You may be at a higher risk of complications from the flu, such as a severe lung infection (pneumonia), if you:  Are over the age of 100.  Are pregnant.  Have a weakened disease-fighting system (immune system). You may have a weakened immune system if you: ? Have HIV or AIDS. ? Are undergoing chemotherapy. ? Aretaking medicines that reduce the activity of (suppress) the immune system.  Have a long-term (chronic) illness, such as heart disease, kidney disease, diabetes, or lung disease.  Have a liver disorder.  Are  obese.  Have anemia.  What are the signs or symptoms? Symptoms of this condition typically last 4-10 days and may include:  Fever.  Chills.  Headache, body aches, or muscle aches.  Sore throat.  Cough.  Runny or congested nose.  Chest discomfort and cough.  Poor appetite.  Weakness or tiredness (fatigue).  Dizziness.  Nausea or vomiting.  How is this diagnosed? This condition may be diagnosed based on your medical history and a physical exam. Your health care provider may do a nose or throat swab test to confirm the diagnosis. How is this treated? If influenza is detected early, you can be treated with antiviral medicine that can reduce the length of your illness and the severity of your symptoms. This medicine may be given by mouth (orally) or through an IV tube that is inserted in one of your veins. The goal of treatment is to relieve symptoms by taking care of yourself at home. This may include taking over-the-counter medicines, drinking plenty of fluids, and adding humidity to the air in your home. In some cases, influenza goes away on its own. Severe influenza or complications from  influenza may be treated in a hospital. Follow these instructions at home:  Take over-the-counter and prescription medicines only as told by your health care provider.  Use a cool mist humidifier to add humidity to the air in your home. This can make breathing easier.  Rest as needed.  Drink enough fluid to keep your urine clear or pale yellow.  Cover your mouth and nose when you cough or sneeze.  Wash your hands with soap and water often, especially after you cough or sneeze. If soap and water are not available, use hand sanitizer.  Stay home from work or school as told by your health care provider. Unless you are visiting your health care provider, try to avoid leaving home until your fever has been gone for 24 hours without the use of medicine.  Keep all follow-up visits as told  by your health care provider. This is important. How is this prevented?  Getting an annual flu shot is the best way to avoid getting the flu. You may get the flu shot in late summer, fall, or winter. Ask your health care provider when you should get your flu shot.  Wash your hands often or use hand sanitizer often.  Avoid contact with people who are sick during cold and flu season.  Eat a healthy diet, drink plenty of fluids, get enough sleep, and exercise regularly. Contact a health care provider if:  You develop new symptoms.  You have: ? Chest pain. ? Diarrhea. ? A fever.  Your cough gets worse.  You produce more mucus.  You feel nauseous or you vomit. Get help right away if:  You develop shortness of breath or difficulty breathing.  Your skin or nails turn a bluish color.  You have severe pain or stiffness in your neck.  You develop a sudden headache or sudden pain in your face or ear.  You cannot stop vomiting. This information is not intended to replace advice given to you by your health care provider. Make sure you discuss any questions you have with your health care provider. Document Released: 02/08/2000 Document Revised: 07/19/2015 Document Reviewed: 12/05/2014 Elsevier Interactive Patient Education  2017 [employer].

## 2017-03-11 NOTE — Progress Notes (Signed)
Subjective:  By signing my name below, I, [name], attest that this documentation has been prepared under the direction and in the presence of Norberto SorensonEva Graylon Amory, MD Electronically Signed: Charline BillsEssence Howell, ED Scribe 03/11/2017 at 11:56 AM.   Patient ID: Dalton KindsWilliam F Derousse, male    DOB: [birth year], 37 y.o.   MRN: [id number]  Chief Complaint  Patient presents with  . flu    was tested positive for flu but getting worse; was given Lasandra BeechXofluza; not working   HPI Dalton KindsWilliam F Kelly is a 37 y.o. male who [facility] to [facility] at [facility] f[facility]u. Pt tested positive for influenza A with nasal congestion, sore throat, generalized myalgias 3 days ago and was given a dose of Xofluza in the office which he states has not improved his symptoms at all. Pt reports that symptoms worsened 24 hrs later. Also reports that he was prescribed decadron which has kept him up over the past 4 days. He is still complaining of fatigue, intermittent fever, chills, minimally productive cough, generalized myalgias, lower abdominal pain. Denies light-headedness, dizziness, sob, chest tightness, appetite change, diarrhea. He was brought in to the office by his mother today who came from out of town to take care of him.  Past Medical History:  Diagnosis Date  . Anxiety   . Back pain   . Barbiturate dependence (HCC)   . Episode of syncope    Recent that sounds vasovagal  . Fatigue   . Generalized anxiety disorder   . Hair loss   . Insomnia   . Irregular bowel habits   . Migraines   . Opioid dependence in controlled environment (HCC)   . Systolic click    Current Outpatient Medications on File Prior to Visit  Medication Sig Dispense Refill  . AJOVY 225 MG/1.5ML SOSY     . Baloxavir Marboxil 80 MG Dose (XOFLUZA) 40 (2) MG TBPK Take by mouth.    . butalbital-acetaminophen-caffeine (FIORICET WITH CODEINE) 50-325-40-30 MG capsule Take 1-2 capsules by mouth every 6 (six) hours as needed for headache or migraine. Do not take more  than 6 tabs/d. 120 capsule 2  . clonazePAM (KLONOPIN) 0.5 MG tablet Take 1 tab po qam and midday and 2 tabs po qhs 120 tablet 2  . cyclobenzaprine (FLEXERIL) 5 MG tablet Take 5 mg by mouth 3 (three) times daily as needed.     . DULoxetine (CYMBALTA) 20 MG capsule Take 1 capsule (20 mg total) by mouth daily. As instructed by provider to taper off. 90 capsule 0  . levothyroxine (SYNTHROID, LEVOTHROID) 25 MCG tablet Take 1 tablet (25 mcg total) by mouth daily before breakfast. 90 tablet 1  . lisinopril (PRINIVIL,ZESTRIL) 10 MG tablet TAKE 1 TABLET BY MOUTH EVERY DAY 90 tablet 0  . promethazine (PHENERGAN) 25 MG tablet Take 1 tablet (25 mg total) every 8 (eight) hours as needed by mouth for nausea or vomiting (migraine headache). 20 tablet 2  . traMADol (ULTRAM) 50 MG tablet Take by mouth every 6 (six) hours as needed.    . traZODone (DESYREL) 150 MG tablet TAKE 1/2 TABLET AT BEDTIME 45 tablet 1  . Vitamin D, Ergocalciferol, (DRISDOL) 50000 units CAPS capsule Take 1 capsule (50,000 Units total) by mouth every 7 (seven) days. 24 capsule 0   No current facility-administered medications on file prior to visit.    Allergies  Allergen Reactions  . Ibuprofen     Abdominal pain  . Nsaids Nausea And Vomiting   Past Surgical History:  Procedure Laterality Date  . WISDOM TOOTH EXTRACTION     Family History  Problem Relation Age of Onset  . Cancer Mother   . Hypertension Father   . Stroke Maternal Grandmother   . Alzheimer's disease Maternal Grandmother   . Cancer Maternal Grandfather   . Diabetes Maternal Grandfather   . Cancer Paternal Grandmother   . Heart failure Paternal Grandfather    Social History   Socioeconomic History  . Marital status: Married    Spouse name: [name]  . Number of children: None  . Years of education: None  . Highest education level: None  Social Needs  . Financial resource strain: None  . Food insecurity - worry: None  . Food insecurity - inability: None  .  Transportation needs - medical: None  . Transportation needs - non-medical: None  Occupational History  . Occupation: therapist    Comment: [employer]  Tobacco Use  . Smoking status: Former Games developer  . Smokeless tobacco: Never Used  Substance and Sexual Activity  . Alcohol use: No    Alcohol/week: 0.0 oz  . Drug use: No  . Sexual activity: None  Other Topics Concern  . None  Social History Narrative   Mental Health therapist at [employer]    Married [name]   Depression screen [facility] 2/9 01/29/2017 01/29/2017 01/07/2017 12/01/2016 09/20/2016  Decreased Interest 0 0 0 0 0  Down, Depressed, Hopeless 0 0 0 0 0  PHQ - 2 Score 0 0 0 0 0    Review of Systems  Constitutional: Positive for chills, fatigue and fever. Negative for appetite change.  Respiratory: Positive for cough. Negative for chest tightness and shortness of breath.   Gastrointestinal: Positive for abdominal pain. Negative for diarrhea.  Musculoskeletal: Positive for myalgias.  Neurological: Negative for dizziness and light-headedness.      Objective:   Physical Exam  Constitutional: He is oriented to person, place, and time. He appears well-developed and well-nourished. No distress.  HENT:  Head: Normocephalic and atraumatic.  Right Ear: Tympanic membrane normal.  Left Ear: Tympanic membrane normal.  Eyes: Conjunctivae and EOM are normal.  Neck: Neck supple. No tracheal deviation present.  Cardiovascular: Normal rate and regular rhythm.  Pulmonary/Chest: Effort normal and breath sounds normal. No respiratory distress.  Abdominal: Bowel sounds are increased.  Musculoskeletal: Normal range of motion.  Lymphadenopathy:    He has cervical adenopathy (anterior).       Right cervical: Posterior cervical adenopathy present.       Left cervical: Posterior cervical adenopathy present.  Neurological: He is alert and oriented to person, place, and time.  Skin: Skin is warm and dry.  Psychiatric: He has a normal mood and  affect. His behavior is normal.  Nursing note and vitals reviewed.  BP 131/88   Pulse 97   Temp 99.1 F (37.3 C) (Oral)   Resp 18   Ht 5\' 7"  (1.702 m)   Wt 185 lb 12.8 oz (84.3 kg)   SpO2 98%   BMI 29.10 kg/m    Results for orders placed or performed in visit on 03/11/17  POCT CBC  Result Value Ref Range   WBC 7.2 4.6 - 10.2 K/uL   Lymph, poc 2.1 0.6 - 3.4   POC LYMPH PERCENT 28.8 10 - 50 %L   MID (cbc) 0.4 0 - 0.9   POC MID % 6.0 0 - 12 %M   POC Granulocyte 4.7 2 - 6.9   Granulocyte percent  65.2 37 - 80 %G   RBC 4.28 (A) 4.69 - 6.13 M/uL   Hemoglobin 13.7 (A) 14.1 - 18.1 g/dL   HCT, POC 11.9 (A) 14.7 - 53.7 %   MCV 91.9 80 - 97 fL   MCH, POC 31.9 (A) 27 - 31.2 pg   MCHC 34.7 31.8 - 35.4 g/dL   RDW, POC 82.9 %   Platelet Count, POC 203 142 - 424 K/uL   MPV 6.8 0 - 99.8 fL  POCT urinalysis dipstick  Result Value Ref Range   Color, UA yellow yellow   Clarity, UA clear clear   Glucose, UA negative negative mg/dL   Bilirubin, UA negative negative   Ketones, POC UA trace (5) (A) negative mg/dL   Spec Grav, UA >=5.621 (A) 1.010 - 1.025   Blood, UA negative negative   pH, UA 5.5 5.0 - 8.0   Protein Ur, POC negative negative mg/dL   Urobilinogen, UA 0.2 0.2 or 1.0 E.U./dL   Nitrite, UA Negative Negative   Leukocytes, UA Negative Negative      Assessment & Plan:   1. Influenza with respiratory manifestation   2. Lower abdominal pain   On day 4 of influenza type A. Stop the decadron as likely preventing sleep. Pt requesting prednisone but reluctant due to risk for immunosuppression and sleep interference. Call or RTC if not sig improving in  3d. Sleep, hydrate, hygeine - trx symptomatically.  Orders Placed This Encounter  Procedures  . POCT CBC  . POCT urinalysis dipstick    Meds ordered this encounter  Medications  . chlorpheniramine-HYDROcodone (TUSSIONEX PENNKINETIC ER) 10-8 MG/5ML SUER    Sig: Take 5 mLs by mouth every 12 (twelve) hours as needed.     Dispense:  120 mL    Refill:  0  . promethazine (PHENERGAN) 25 MG tablet    Sig: Take 1 tablet (25 mg total) by mouth every 8 (eight) hours as needed for nausea or vomiting (migraine headache).    Dispense:  20 tablet    Refill:  2    I personally performed the services described in this documentation, which was scribed in my presence. The recorded information has been reviewed and considered, and addended by me as needed.   [name], M.D.  Primary Care at [facility] [address] [place], [place] [address] [phone] phone [phone] fax  03/17/17 11:33 PM

## 2017-03-11 NOTE — Progress Notes (Signed)
Shay   

## 2017-03-14 ENCOUNTER — Telehealth: Payer: Self-pay | Admitting: Family Medicine

## 2017-03-14 ENCOUNTER — Encounter: Payer: Self-pay | Admitting: Family Medicine

## 2017-03-14 MED ORDER — PREDNISONE 10 MG PO TABS: ORAL_TABLET | ORAL | 0 refills | Status: DC

## 2017-03-17 NOTE — Telephone Encounter (Signed)
Pt called evening of 03/14/17 - Sat. Feeling a little better. No fever x 24 hrs. Still very nauseated and achy. Sleeping only a little w/ the tussionex. On clonazepam 1mg  qhs (and 0.5mg  qam) currently - Ok to increase clonazepam to 2 mg qhs to try to help sleep but do not take w/in 2 hrs of the tussionex - extreme caution as both long-acting. Pt states he has done very well on prednisone prior and does not cause sleep problems for him. Would like to RTW within 48 hrs. Sent in prednisone as seems to be slowly iimproving but really wants to be able to RTW as has missed to much and fever free x 24 hrs. RTC immed if worsening at all due to risk of immunosuppression and secondary infection after flu. Practice good hygeine.

## 2017-04-20 ENCOUNTER — Other Ambulatory Visit: Payer: Self-pay | Admitting: Family Medicine

## 2017-04-23 ENCOUNTER — Telehealth: Payer: Self-pay | Admitting: Family Medicine

## 2017-04-23 ENCOUNTER — Other Ambulatory Visit: Payer: Self-pay

## 2017-04-23 ENCOUNTER — Encounter: Payer: Self-pay | Admitting: Family Medicine

## 2017-04-23 ENCOUNTER — Ambulatory Visit (INDEPENDENT_AMBULATORY_CARE_PROVIDER_SITE_OTHER): Payer: 59 | Admitting: Family Medicine

## 2017-04-23 VITALS — BP 112/68 | HR 113 | Temp 98.6°F | Resp 18 | Ht 67.0 in | Wt 186.8 lb

## 2017-04-23 DIAGNOSIS — Z5181 Encounter for therapeutic drug level monitoring: Secondary | ICD-10-CM

## 2017-04-23 DIAGNOSIS — G43109 Migraine with aura, not intractable, without status migrainosus: Secondary | ICD-10-CM | POA: Diagnosis not present

## 2017-04-23 DIAGNOSIS — I10 Essential (primary) hypertension: Secondary | ICD-10-CM | POA: Diagnosis not present

## 2017-04-23 DIAGNOSIS — E039 Hypothyroidism, unspecified: Secondary | ICD-10-CM | POA: Diagnosis not present

## 2017-04-23 DIAGNOSIS — R5382 Chronic fatigue, unspecified: Secondary | ICD-10-CM

## 2017-04-23 DIAGNOSIS — F411 Generalized anxiety disorder: Secondary | ICD-10-CM

## 2017-04-23 DIAGNOSIS — E559 Vitamin D deficiency, unspecified: Secondary | ICD-10-CM

## 2017-04-23 DIAGNOSIS — E038 Other specified hypothyroidism: Secondary | ICD-10-CM

## 2017-04-23 MED ORDER — DULOXETINE HCL 20 MG PO CPEP: ORAL_CAPSULE | ORAL | 0 refills | Status: DC

## 2017-04-23 MED ORDER — LISINOPRIL 10 MG PO TABS: 10.0000 mg | ORAL_TABLET | Freq: Every day | ORAL | 2 refills | Status: DC

## 2017-04-23 MED ORDER — BUTALBITAL-APAP-CAFF-COD 50-325-40-30 MG PO CAPS: 1.0000 | ORAL_CAPSULE | Freq: Four times a day (QID) | ORAL | 2 refills | Status: DC | PRN

## 2017-04-23 MED ORDER — LEVOTHYROXINE SODIUM 50 MCG PO TABS: 50.0000 ug | ORAL_TABLET | Freq: Every day | ORAL | 0 refills | Status: DC

## 2017-04-23 MED ORDER — CLONAZEPAM 0.5 MG PO TABS: ORAL_TABLET | ORAL | 2 refills | Status: DC

## 2017-04-23 NOTE — Telephone Encounter (Signed)
Copied from CRM [id number]. Topic: Quick Communication - See Telephone Encounter >> Apr 23, 2017  1:09 PM Guinevere FerrariMorris, Emie Sommerfeld E, NT wrote: CRM for notification. See Telephone encounter for: Patient called back and said that he just left the office and received a prescription for clonazePAM (KLONOPIN) 0.5 MG tablet and pharmacy is refusing to fill it. Pt asked if doctor can call pharmacy and let them know it is ok to fill. Pharmacist name is Arline AspCindy or Renae Fickleaul Pt [id number] [place] - [address] [phone] (Phone) [phone] (Fax)    04/23/17.

## 2017-04-23 NOTE — Patient Instructions (Signed)
     IF you received an x-ray today, you will receive an invoice from [facility]. Please contact [facility] at [phone] with questions or concerns regarding your invoice.   IF you received labwork today, you will receive an invoice from [facility]. Please contact [facility] at [phone] with questions or concerns regarding your invoice.   Our billing staff will not be able to assist you with questions regarding bills from these companies.  You will be contacted with the lab results as soon as they are available. The fastest way to get your results is to activate your My Chart account. Instructions are located on the last page of this paperwork. If you have not heard from us regarding the results in 2 weeks, please contact this office.     

## 2017-04-23 NOTE — Progress Notes (Addendum)
Subjective:  By signing my name below, I, Dalton Kelly, attest that this documentation has been prepared under the direction and in the presence of Dalton Sorenson, MD Electronically Signed: [name], ED Scribe 04/23/2017 at 12:09 PM.   Patient ID: Dalton Kelly, male    DOB: [birth year], 37 y.o.   MRN: [id number]  Chief Complaint  Patient presents with  . Fatigue  . Hypothyroidism  . Follow-up  . Medication Refill    Fioricet, Klonopin 0.5 MG, Synthyroid 25 MCG, Cymbalta 20 MG, Pt would like to discuss getting off of Cymbalta   HPI Dalton Kelly is a 37 y.o. male who presents to Primary Care at [facility] for f/u. When he wasn't sleeping with the flu after he was given decadron and couldn't sleep so he took more klonopin.   Hypothyroidism Pt states this is the first month that he's been at work for an entire week but he reports fatigue on the weekends, reporting he can sleep until 7 PM even with going to bed at his normal bedtime. He is reporting 7-7.5 hours of sleep/night during the week. Reports feeling "decently" rested upon waking.  Pt would like to stop Cymbalta. He has not noticed any side-effects from weaning himself off of the medication 4-5 days ago.  Past Medical History:  Diagnosis Date  . Anxiety   . Back pain   . Barbiturate dependence (HCC)   . Episode of syncope    Recent that sounds vasovagal  . Fatigue   . Generalized anxiety disorder   . Hair loss   . Insomnia   . Irregular bowel habits   . Migraines   . Opioid dependence in controlled environment (HCC)   . Systolic click    Current Outpatient Medications on File Prior to Visit  Medication Sig Dispense Refill  . Baloxavir Marboxil 80 MG Dose (XOFLUZA) 40 (2) MG TBPK Take by mouth.    . butalbital-acetaminophen-caffeine (FIORICET WITH CODEINE) 50-325-40-30 MG capsule Take 1-2 capsules by mouth every 6 (six) hours as needed for headache or migraine. Do not take more than 6 tabs/d. 120 capsule 2  .  clonazePAM (KLONOPIN) 0.5 MG tablet Take 1 tab po qam and midday and 2 tabs po qhs 120 tablet 2  . cyclobenzaprine (FLEXERIL) 5 MG tablet Take 5 mg by mouth 3 (three) times daily as needed.     . DULoxetine (CYMBALTA) 20 MG capsule Take 1 capsule (20 mg total) by mouth daily. As instructed by provider to taper off. 90 capsule 0  . levothyroxine (SYNTHROID, LEVOTHROID) 25 MCG tablet Take 1 tablet (25 mcg total) by mouth daily before breakfast. 90 tablet 1  . lisinopril (PRINIVIL,ZESTRIL) 10 MG tablet TAKE 1 TABLET BY MOUTH EVERY DAY 90 tablet 0  . promethazine (PHENERGAN) 25 MG tablet Take 1 tablet (25 mg total) by mouth every 8 (eight) hours as needed for nausea or vomiting (migraine headache). 20 tablet 2  . traMADol (ULTRAM) 50 MG tablet Take by mouth every 6 (six) hours as needed.    . traZODone (DESYREL) 150 MG tablet TAKE 1/2 TABLET AT BEDTIME 45 tablet 1  . Vitamin D, Ergocalciferol, (DRISDOL) 50000 units CAPS capsule Take 1 capsule (50,000 Units total) by mouth every 7 (seven) days. 24 capsule 0  . AJOVY 225 MG/1.5ML SOSY     . chlorpheniramine-HYDROcodone (TUSSIONEX PENNKINETIC ER) 10-8 MG/5ML SUER Take 5 mLs by mouth every 12 (twelve) hours as needed. (Patient not taking: Reported on 04/23/2017) 120 mL 0  .  predniSONE (DELTASONE) 10 MG tablet 6-5-4-3-2-1 tabs po qd (Patient not taking: Reported on 04/23/2017) 21 tablet 0   No current facility-administered medications on file prior to visit.    Allergies  Allergen Reactions  . Ibuprofen     Abdominal pain  . Nsaids Nausea And Vomiting   Past Surgical History:  Procedure Laterality Date  . WISDOM TOOTH EXTRACTION     Family History  Problem Relation Age of Onset  . Cancer Mother   . Hypertension Father   . Stroke Maternal Grandmother   . Alzheimer's disease Maternal Grandmother   . Cancer Maternal Grandfather   . Diabetes Maternal Grandfather   . Cancer Paternal Grandmother   . Heart failure Paternal Grandfather    Social  History   Socioeconomic History  . Marital status: Married    Spouse name: Dalton HuaDavid  . Number of children: None  . Years of education: None  . Highest education level: None  Social Needs  . Financial resource strain: None  . Food insecurity - worry: None  . Food insecurity - inability: None  . Transportation needs - medical: None  . Transportation needs - non-medical: None  Occupational History  . Occupation: therapist    Comment: [employer]  Tobacco Use  . Smoking status: Former Games developermoker  . Smokeless tobacco: Never Used  Substance and Sexual Activity  . Alcohol use: No    Alcohol/week: 0.0 oz  . Drug use: No  . Sexual activity: None  Other Topics Concern  . None  Social History Narrative   Mental Health therapist at [employer]    Married Dalton Hua- David   [facility] 04/23/2017 01/29/2017 01/29/2017 01/07/2017 12/01/2016  Decreased Interest 0 0 0 0 0  Down, Depressed, Hopeless 0 0 0 0 0  PHQ - 2 Score 0 0 0 0 0    Review of Systems  Constitutional: Positive for fatigue.      Objective:   Physical Exam  Constitutional: He is oriented to person, place, and time. He appears well-developed and well-nourished. No distress.  HENT:  Head: Normocephalic and atraumatic.  Eyes: Conjunctivae and EOM are normal.  Neck: Neck supple. No tracheal deviation present.  Cardiovascular: Normal rate.  Pulmonary/Chest: Effort normal. No respiratory distress.  Musculoskeletal: Normal range of motion.  Neurological: He is alert and oriented to person, place, and time.  Skin: Skin is warm and dry.  Psychiatric: He has a normal mood and affect. His behavior is normal.  Nursing note and vitals reviewed.  BP 112/68 (BP Location: Left Arm, Patient Position: Sitting, Cuff Size: Normal)   Pulse (!) 113   Temp 98.6 F (37 C) (Oral)   Resp 18   Ht 5\' 7"  (1.702 m)   Wt 186 lb 12.8 oz (84.7 kg)   SpO2 100%   BMI 29.26 kg/m     Assessment & Plan:   1. Chronic fatigue - slowly  getting some improvement- needs to start adding in some exercise - discuss at f/u  2. Essential hypertension - doing great on lisinopril 10  3. Subclinical hypothyroidism - has done will since starting levothyroxine 25 but feels like initial effect has warn off, labs have been stable so wants to try to increase dose ot 50 mcg - recheck thyroid panel in 2-3 mos before any additional refills  4. Vitamin D deficiency - started high dose vit D recently, recheck  5. Medication monitoring encounter   6. GAD (generalized anxiety disorder) - doing  better as he has weaned down to cymbalta 20mg . Want to try to go off so encouraged him to just stop but is worried about w/d side effects so gave pt paper rx to fill to decrease dose to qod then every 3rd day if he is noticing w/d effects over the next 1-2d. Ok to refill klonopin early this once - used more on my permission when he was on decadron then prednisone and will with influenza x 10d so is out early.  7. Migraine with aura and without status migrainosus, not intractable - severe, hoping ajoyvi will kick in and botox will wear out in another 1-2 mos. Following w/ neurology    Orders Placed This Encounter  Procedures  . CBC with Differential/Platelet  . TSH+T4F+T3Free  . Comprehensive metabolic panel  . VITAMIN D 25 Hydroxy (Vit-D Deficiency, Fractures)  . VITAMIN D 25 Hydroxy (Vit-D Deficiency, Fractures)    Meds ordered this encounter  Medications  . levothyroxine (SYNTHROID, LEVOTHROID) 50 MCG tablet    Sig: Take 1 tablet (50 mcg total) by mouth daily before breakfast.    Dispense:  90 tablet    Refill:  0  . DULoxetine (CYMBALTA) 20 MG capsule    Sig: Take 1 capsule (20 mg total) by mouth every other day for 21 days, THEN 1 capsule (20 mg total) every 3 (three) days for 21 days. then stop.    Dispense:  17 capsule    Refill:  0  . lisinopril (PRINIVIL,ZESTRIL) 10 MG tablet    Sig: Take 1 tablet (10 mg total) by mouth daily.    Dispense:   90 tablet    Refill:  2    Please add on as refills to current rx/.  . clonazePAM (KLONOPIN) 0.5 MG tablet    Sig: Take 1 tab po qam and midday and 2 tabs po qhs    Dispense:  120 tablet    Refill:  2  . butalbital-acetaminophen-caffeine (FIORICET WITH CODEINE) 50-325-40-30 MG capsule    Sig: Take 1-2 capsules by mouth every 6 (six) hours as needed for headache or migraine. Do not take more than 6 tabs/d.    Dispense:  120 capsule    Refill:  2    May refill no sooner than every 28 days.    I personally performed the services described in this documentation, which was scribed in my presence. The recorded information has been reviewed and considered, and addended by me as needed.   Dalton Kelly, M.D.  Primary Care at [facility] [address] [place], [place] [address] [phone] phone [phone] fax  04/26/17 9:34 AM

## 2017-04-24 LAB — CBC WITH DIFFERENTIAL/PLATELET
BASOS ABS: 0 10*3/uL (ref 0.0–0.2)
Basos: 1 %
EOS (ABSOLUTE): 0.1 10*3/uL (ref 0.0–0.4)
Eos: 1 %
HEMATOCRIT: 41.3 % (ref 37.5–51.0)
HEMOGLOBIN: 14.3 g/dL (ref 13.0–17.7)
IMMATURE GRANULOCYTES: 0 %
Immature Grans (Abs): 0 10*3/uL (ref 0.0–0.1)
LYMPHS ABS: 1.7 10*3/uL (ref 0.7–3.1)
LYMPHS: 29 %
MCH: 31.8 pg (ref 26.6–33.0)
MCHC: 34.6 g/dL (ref 31.5–35.7)
MCV: 92 fL (ref 79–97)
MONOS ABS: 0.5 10*3/uL (ref 0.1–0.9)
Monocytes: 8 %
Neutrophils Absolute: 3.8 10*3/uL (ref 1.4–7.0)
Neutrophils: 61 %
Platelets: 273 10*3/uL (ref 150–379)
RBC: 4.5 x10E6/uL (ref 4.14–5.80)
RDW: 12.9 % (ref 12.3–15.4)
WBC: 6 10*3/uL (ref 3.4–10.8)

## 2017-04-24 LAB — COMPREHENSIVE METABOLIC PANEL
A/G RATIO: 2 (ref 1.2–2.2)
ALK PHOS: 76 IU/L (ref 39–117)
ALT: 20 IU/L (ref 0–44)
AST: 16 IU/L (ref 0–40)
Albumin: 4.3 g/dL (ref 3.5–5.5)
BUN/Creatinine Ratio: 10 (ref 9–20)
BUN: 7 mg/dL (ref 6–20)
Bilirubin Total: 0.2 mg/dL (ref 0.0–1.2)
CHLORIDE: 104 mmol/L (ref 96–106)
CO2: 26 mmol/L (ref 20–29)
Calcium: 9.1 mg/dL (ref 8.7–10.2)
Creatinine, Ser: 0.7 mg/dL — ABNORMAL LOW (ref 0.76–1.27)
GFR calc Af Amer: 139 mL/min/{1.73_m2} (ref >59)
GFR calc non Af Amer: 121 mL/min/{1.73_m2} (ref >59)
GLOBULIN, TOTAL: 2.2 g/dL (ref 1.5–4.5)
Glucose: 89 mg/dL (ref 65–99)
POTASSIUM: 3.6 mmol/L (ref 3.5–5.2)
SODIUM: 144 mmol/L (ref 134–144)
Total Protein: 6.5 g/dL (ref 6.0–8.5)

## 2017-04-24 LAB — VITAMIN D 25 HYDROXY (VIT D DEFICIENCY, FRACTURES): VIT D 25 HYDROXY: 49.1 ng/mL (ref 30.0–100.0)

## 2017-04-24 LAB — TSH+T4F+T3FREE
FREE T4: 1.43 ng/dL (ref 0.82–1.77)
T3, Free: 3.6 pg/mL (ref 2.0–4.4)
TSH: 1.39 u[IU]/mL (ref 0.450–4.500)

## 2017-04-26 ENCOUNTER — Other Ambulatory Visit: Payer: Self-pay | Admitting: Family Medicine

## 2017-04-26 NOTE — Telephone Encounter (Signed)
Dr.Shaw called in med to pharmacy to wean

## 2017-04-27 NOTE — Telephone Encounter (Signed)
LOV 04/23/17 with Dr. Clelia CroftShaw / Refill request for Cymbalta / Last script written as:  Disp Refills Start End   DULoxetine (CYMBALTA) 20 MG capsule 17 capsule 0 04/23/2017 06/04/2017   Sig - Route: Take 1 capsule (20 mg total) by mouth every other day for 21 days, THEN 1 capsule (20 mg total) every 3 (three) days for 21 days. then stop. - Oral   Class: Print

## 2017-04-28 MED ORDER — DULOXETINE HCL 20 MG PO CPEP: ORAL_CAPSULE | ORAL | 0 refills | Status: DC

## 2017-04-29 ENCOUNTER — Ambulatory Visit: Payer: Self-pay | Admitting: *Deleted

## 2017-04-29 NOTE — Telephone Encounter (Signed)
Pt states has been weaning off Cymbalta; last titration, 20 mg for 2 weeks. Has been off completely since Thursday 04/23/17. States since Monday 04/27/17 has experienced intermittent numbness of feet and fingers,some tingling. Night sweats, lightheaded at times. Pt is questioning how long these symptoms may last. States he does not want to go back on Cymbalta but is aware Dr. Clelia CroftShaw did send in RX if needed. However states insurance requires 90 day supply.  Please advise :  [phone]  Reason for Disposition . Caller has NON-URGENT medication question about med that PCP prescribed and triager unable to answer question  Answer Assessment - Initial Assessment Questions 1. SYMPTOMS: "Do you have any symptoms?"     Yes. Weaning from Cymbalta; off since Thursday 04/23/17. Intermittent numbness feet and fingers,tingling. Night sweats, lightheaded. 2. SEVERITY: If symptoms are present, ask "Are they mild, moderate or severe?"    Varies, intermittent  Protocols used: MEDICATION QUESTION CALL-A-AH

## 2017-04-30 MED ORDER — DULOXETINE HCL 20 MG PO CPEP: 20.0000 mg | ORAL_CAPSULE | Freq: Every day | ORAL | 0 refills | Status: DC

## 2017-04-30 NOTE — Telephone Encounter (Signed)
Sent in 90d refill to cymbalta for him to use to taper off more slowly

## 2017-04-30 NOTE — Telephone Encounter (Signed)
Phone call to patient. Per signed authorization to leave detailed message, left message stating 90 day supply of [facility]ymb[facility] [place] [address] for him. Please call back if any questions.

## 2017-05-08 ENCOUNTER — Encounter: Payer: Self-pay | Admitting: Family Medicine

## 2017-05-09 MED ORDER — HYDROCODONE-ACETAMINOPHEN 5-325 MG PO TABS: 1.0000 | ORAL_TABLET | Freq: Four times a day (QID) | ORAL | 0 refills | Status: DC | PRN

## 2017-05-11 ENCOUNTER — Encounter: Payer: Self-pay | Admitting: Family Medicine

## 2017-05-11 ENCOUNTER — Ambulatory Visit (INDEPENDENT_AMBULATORY_CARE_PROVIDER_SITE_OTHER): Payer: 59 | Admitting: Family Medicine

## 2017-05-11 ENCOUNTER — Other Ambulatory Visit: Payer: Self-pay

## 2017-05-11 VITALS — BP 118/76 | HR 118 | Temp 97.9°F | Resp 18 | Ht 67.0 in | Wt 188.6 lb

## 2017-05-11 DIAGNOSIS — G894 Chronic pain syndrome: Secondary | ICD-10-CM | POA: Diagnosis not present

## 2017-05-11 DIAGNOSIS — F19939 Other psychoactive substance use, unspecified with withdrawal, unspecified: Secondary | ICD-10-CM | POA: Diagnosis not present

## 2017-05-11 DIAGNOSIS — G8929 Other chronic pain: Secondary | ICD-10-CM

## 2017-05-11 DIAGNOSIS — T50905D Adverse effect of unspecified drugs, medicaments and biological substances, subsequent encounter: Secondary | ICD-10-CM | POA: Diagnosis not present

## 2017-05-11 DIAGNOSIS — F19982 Other psychoactive substance use, unspecified with psychoactive substance-induced sleep disorder: Secondary | ICD-10-CM | POA: Diagnosis not present

## 2017-05-11 DIAGNOSIS — F5101 Primary insomnia: Secondary | ICD-10-CM | POA: Diagnosis not present

## 2017-05-11 MED ORDER — PROMETHAZINE HCL 25 MG PO TABS: 25.0000 mg | ORAL_TABLET | Freq: Three times a day (TID) | ORAL | 5 refills | Status: DC | PRN

## 2017-05-11 MED ORDER — OXYCODONE HCL 5 MG PO TABS: 5.0000 mg | ORAL_TABLET | ORAL | 0 refills | Status: DC | PRN

## 2017-05-11 NOTE — Patient Instructions (Signed)
     IF you received an x-ray today, you will receive an invoice from [facility]. Please contact [facility] at [phone] with questions or concerns regarding your invoice.   IF you received labwork today, you will receive an invoice from [facility]. Please contact [facility] at [phone] with questions or concerns regarding your invoice.   Our billing staff will not be able to assist you with questions regarding bills from these companies.  You will be contacted with the lab results as soon as they are available. The fastest way to get your results is to activate your My Chart account. Instructions are located on the last page of this paperwork. If you have not heard from us regarding the results in 2 weeks, please contact this office.     

## 2017-05-11 NOTE — Progress Notes (Signed)
Subjective:  By signing my name below, I, Dalton Kelly, attest that this documentation has been prepared under the direction and in the presence of Dalton Sorenson, MD. Electronically Signed: Stann Kelly, Scribe. 05/11/2017 , 12:09 PM .  Patient was seen in Room 2 .   Patient ID: IHAN PAT, male    DOB: 11-Jun-1980, 37 y.o.   MRN: [id number] Chief Complaint  Patient presents with  . Medication Management    Pt states he is tapering off of Cymbalta. Pt states it is not easy coming off. Pt stated he is having side effects like dizziness, headache, nausea, irritability, insomina, anxiety, fatigue.  . Follow-up   HPI Dalton Kelly is a 37 y.o. male who presents to Primary Care at [facility] for follow up. Patient has been trying to taper off of Cymbalta, but he's been having side effects of off balance dizziness, nausea, muscle pain, and headaches that have been worse than usual. He denies vomiting. He's been feeling off balance when walking where his family have been catching him a few times. He's been taking Cymbalta 20mg  over the weekend without improvement. He describes headaches have been ridiculous, where he wanting to leave during a movie. He also mentions sweating at night where he's soaking through his clothes. His muscle aches have been located everywhere but more notable over his back. He's purchased empty capsules off of [employer] and plans to taper with that.   When he was tapering off of Cymbalta, he notes sleep was an issue. His sleep has improved after restarting Cymbalta. He didn't fill hydrocodone from last visit. His neurologist is Dr. Sharene Skeans.   Past Medical History:  Diagnosis Date  . Anxiety   . Back pain   . Barbiturate dependence (HCC)   . Episode of syncope    Recent that sounds vasovagal  . Fatigue   . Generalized anxiety disorder   . Hair loss   . Insomnia   . Irregular bowel habits   . Migraines   . Opioid dependence in controlled environment (HCC)   . Systolic  click    Past Surgical History:  Procedure Laterality Date  . WISDOM TOOTH EXTRACTION     Prior to Admission medications   Medication Sig Start Date End Date Taking? Authorizing Provider  AJOVY 225 MG/1.5ML SOSY  01/20/17   [provider]  butalbital-acetaminophen-caffeine (FIORICET WITH CODEINE) 50-325-40-30 MG capsule Take 1-2 capsules by mouth every 6 (six) hours as needed for headache or migraine. Do not take more than 6 tabs/d. 04/23/17   Sherren Mocha, MD  clonazePAM Scarlette Calico) 0.5 MG tablet Take 1 tab po qam and midday and 2 tabs po qhs 04/23/17   Sherren Mocha, MD  cyclobenzaprine (FLEXERIL) 5 MG tablet Take 5 mg by mouth 3 (three) times daily as needed.  10/25/15   Frederico Hamman, MD  DULoxetine (CYMBALTA) 20 MG capsule Take 1 capsule (20 mg total) by mouth daily. As directed by physician 04/30/17   Sherren Mocha, MD  HYDROcodone-acetaminophen (NORCO/VICODIN) 5-325 MG tablet Take 1 tablet by mouth every 6 (six) hours as needed for moderate pain. 05/09/17   Sherren Mocha, MD  levothyroxine (SYNTHROID, LEVOTHROID) 50 MCG tablet Take 1 tablet (50 mcg total) by mouth daily before breakfast. 04/23/17   Sherren Mocha, MD  lisinopril (PRINIVIL,ZESTRIL) 10 MG tablet Take 1 tablet (10 mg total) by mouth daily. 04/23/17   Sherren Mocha, MD  promethazine (PHENERGAN) 25 MG tablet Take 1 tablet (25  mg total) by mouth every 8 (eight) hours as needed for nausea or vomiting (migraine headache). 03/11/17   Sherren Mocha, MD  traMADol Dalton Kelly) 50 MG tablet Take by mouth every 6 (six) hours as needed.    [provider]  traZODone (DESYREL) 150 MG tablet TAKE 1/2 TABLET AT BEDTIME 11/28/16   Sherren Mocha, MD  Vitamin D, Ergocalciferol, (DRISDOL) 50000 units CAPS capsule Take 1 capsule (50,000 Units total) by mouth every 7 (seven) days. 01/29/17   Sherren Mocha, MD   Allergies  Allergen Reactions  . Ibuprofen     Abdominal pain  . Nsaids Nausea And Vomiting   Family History  Problem Relation Age of Onset    . Cancer Mother   . Hypertension Father   . Stroke Maternal Grandmother   . Alzheimer's disease Maternal Grandmother   . Cancer Maternal Grandfather   . Diabetes Maternal Grandfather   . Cancer Paternal Grandmother   . Heart failure Paternal Grandfather    Social History   Socioeconomic History  . Marital status: Married    Spouse name: Dalton Kelly  . Number of children: None  . Years of education: None  . Highest education level: None  Social Needs  . Financial resource strain: None  . Food insecurity - worry: None  . Food insecurity - inability: None  . Transportation needs - medical: None  . Transportation needs - non-medical: None  Occupational History  . Occupation: therapist    Comment: [employer]  Tobacco Use  . Smoking status: Former Games developer  . Smokeless tobacco: Never Used  Substance and Sexual Activity  . Alcohol use: No    Alcohol/week: 0.0 oz  . Drug use: No  . Sexual activity: None  Other Topics Concern  . None  Social History Narrative   Mental Health therapist at [employer]    Married Dalton Kelly   Depression screen [facility] 2/9 05/11/2017 04/23/2017 01/29/2017 01/29/2017 01/07/2017  Decreased Interest 0 0 0 0 0  Down, Depressed, Hopeless 0 0 0 0 0  PHQ - 2 Score 0 0 0 0 0    Review of Systems  Constitutional: Positive for fatigue. Negative for unexpected weight change.  Eyes: Negative for visual disturbance.  Respiratory: Negative for cough, chest tightness and shortness of breath.   Cardiovascular: Negative for chest pain, palpitations and leg swelling.  Gastrointestinal: Positive for nausea. Negative for abdominal pain and blood in stool.  Neurological: Positive for dizziness and headaches. Negative for light-headedness.  Psychiatric/Behavioral: Positive for agitation and sleep disturbance. The patient is nervous/anxious.        Objective:   Physical Exam  Constitutional: He is oriented to person, place, and time. He appears well-developed and  well-nourished. No distress.  HENT:  Head: Normocephalic and atraumatic.  Eyes: EOM are normal. Pupils are equal, round, and reactive to light.  Neck: Neck supple.  Cardiovascular: Normal rate.  Pulmonary/Chest: Effort normal. No respiratory distress.  Musculoskeletal: Normal range of motion.  Neurological: He is alert and oriented to person, place, and time.  Skin: Skin is warm and dry.  Psychiatric: He has a normal mood and affect. His behavior is normal.  Nursing note and vitals reviewed.   BP 118/76 (BP Location: Left Arm, Patient Position: Sitting, Cuff Size: Normal)   Pulse (!) 118   Temp 97.9 F (36.6 C) (Oral)   Resp 18   Ht 5\' 7"  (1.702 m)   Wt 188 lb 9.6 oz (85.5 kg)  SpO2 100%   BMI 29.54 kg/m      Assessment & Plan:   1. Withdrawal from other psychoactive substance (HCC) - from cymbalta - cont taper  2. Other chronic pain - pt adament not on controlled drug contract w/ ortho who gives him tramadol for chronic back pain - advised to alert ortho anyway of narcotics for severe myaglas/arthralgias from cymbalta withdrawl  3. Drug-induced insomnia (HCC)      Meds ordered this encounter  Medications  . oxyCODONE (OXY IR/ROXICODONE) 5 MG immediate release tablet    Sig: Take 1-2 tablets (5-10 mg total) by mouth every 4 (four) hours as needed for severe pain.    Dispense:  60 tablet    Refill:  0  . promethazine (PHENERGAN) 25 MG tablet    Sig: Take 1 tablet (25 mg total) by mouth every 8 (eight) hours as needed for nausea or vomiting (migraine headache).    Dispense:  20 tablet    Refill:  5  . oxyCODONE (OXY IR/ROXICODONE) 5 MG immediate release tablet    Sig: Take 1-2 tablets (5-10 mg total) by mouth every 4 (four) hours as needed for severe pain.    Dispense:  60 tablet    Refill:  0    Please d/c hydrocodone that is still on file. Will use this instead.    I personally performed the services described in this documentation, which was scribed in my  presence. The recorded information has been reviewed and considered, and addended by me as needed.   Dalton SorensonEva Mizraim Harmening, M.D.  Primary Care at [facility]  [place] [address] [place], [place] [id number] [phone] phone [phone] fax  08/14/17 3:27 PM

## 2017-05-16 ENCOUNTER — Other Ambulatory Visit: Payer: Self-pay | Admitting: Family Medicine

## 2017-05-16 ENCOUNTER — Encounter: Payer: Self-pay | Admitting: Family Medicine

## 2017-05-18 NOTE — Telephone Encounter (Signed)
Drisdol refill request  LOV 05/11/17 with Dr. Clelia CroftShaw  [id number] [place] - [address]

## 2017-05-19 ENCOUNTER — Encounter: Payer: Self-pay | Admitting: Family Medicine

## 2017-05-20 MED ORDER — CLONIDINE HCL 0.2 MG PO TABS: 0.2000 mg | ORAL_TABLET | Freq: Every day | ORAL | 0 refills | Status: DC

## 2017-05-20 NOTE — Telephone Encounter (Signed)
Yes, pt's 3/23 MyChart email is received and is being responded to now.

## 2017-05-21 DIAGNOSIS — G43709 Chronic migraine without aura, not intractable, without status migrainosus: Secondary | ICD-10-CM | POA: Diagnosis not present

## 2017-06-09 ENCOUNTER — Ambulatory Visit (INDEPENDENT_AMBULATORY_CARE_PROVIDER_SITE_OTHER): Payer: 59 | Admitting: Family Medicine

## 2017-06-09 ENCOUNTER — Encounter: Payer: Self-pay | Admitting: Family Medicine

## 2017-06-09 ENCOUNTER — Other Ambulatory Visit: Payer: Self-pay

## 2017-06-09 VITALS — BP 110/70 | HR 127 | Temp 98.2°F | Resp 18 | Ht 67.0 in | Wt 183.6 lb

## 2017-06-09 DIAGNOSIS — G894 Chronic pain syndrome: Secondary | ICD-10-CM | POA: Diagnosis not present

## 2017-06-09 DIAGNOSIS — T50905D Adverse effect of unspecified drugs, medicaments and biological substances, subsequent encounter: Secondary | ICD-10-CM | POA: Diagnosis not present

## 2017-06-09 DIAGNOSIS — F5101 Primary insomnia: Secondary | ICD-10-CM

## 2017-06-09 DIAGNOSIS — F19939 Other psychoactive substance use, unspecified with withdrawal, unspecified: Secondary | ICD-10-CM

## 2017-06-09 MED ORDER — OXYCODONE HCL 5 MG PO TABS: 5.0000 mg | ORAL_TABLET | ORAL | 0 refills | Status: DC | PRN

## 2017-06-09 MED ORDER — ZOLPIDEM TARTRATE 10 MG PO TABS: 5.0000 mg | ORAL_TABLET | Freq: Every evening | ORAL | 0 refills | Status: DC | PRN

## 2017-06-09 NOTE — Progress Notes (Signed)
Subjective:    Patient ID: Dalton Kelly, male    DOB: 03-Mar-1980, 37 y.o.   MRN: [id number] Chief Complaint  Patient presents with  . Medication Management    Pt would like to discuss coming off of Cymbalta    HPI    States there are a 110 beads 7 17 27  = remove 30 37 =  remove 40  - this is the week he is on; on Thursday he starts new dose so that he can be home on Sat and Sun when his symptoms are the worst.  45 = remove 50 50 = remove 55  60 = 66 70 = remove 77 80 = remove 88 90 = 99  Still not sleeping - maybe 2-3 hours broken all together. In the past 10d, he has started hallucinating a little from seeing shadows and think there  Insomnia as head is pounding all the time and is in pain.  Keep telling himself that it will get better. Last Mon he couldn't get out of bed all day - didn't get up at all from 10 am to 11 pm and last Wed - not sleeping, just lying there.  Is taking trazodone 75 which usually helps and he is still taking but can't increase dose any further as limited by HA, - even HA  Convinced that the HA and overall body aches, sweats through pillow and clothes - he was soaked but he couldn't get up because he was dizzy but then was cold.  Body temp regulation is first thing to go when not sleeping. Trying to change bedroom to get different temps  Voice gone today Taking melatoni as well. Failed amitripthyline prior. Failed xanax (to hort) Used to be able to sleep with benadryl, nyquil, phenergan but doesn't make him sleepy any longer  Oxycodone was helpful.  He does get HAs when he doesn't sleep anyway.  Fioricet with codiene is also helpful. He did his 5th Ajoby injectio nlast night and reallly hoping it will kick in.  It did not last a whole month. - It lasted about ~ 3wks - depended on the day - doesn't have weekends - just lays in bed the whole day.  Tries to look outside or at natural light when waking up so his body knows it is supposed to be awake. But  will try to go outside for alittle I nthe morning.   Past Medical History:  Diagnosis Date  . Anxiety   . Back pain   . Barbiturate dependence (HCC)   . Episode of syncope    Recent that sounds vasovagal  . Fatigue   . Generalized anxiety disorder   . Hair loss   . Insomnia   . Irregular bowel habits   . Migraines   . Opioid dependence in controlled environment (HCC)   . Systolic click    Past Surgical History:  Procedure Laterality Date  . WISDOM TOOTH EXTRACTION     Current Outpatient Medications on File Prior to Visit  Medication Sig Dispense Refill  . AJOVY 225 MG/1.5ML SOSY     . butalbital-acetaminophen-caffeine (FIORICET WITH CODEINE) 50-325-40-30 MG capsule Take 1-2 capsules by mouth every 6 (six) hours as needed for headache or migraine. Do not take more than 6 tabs/d. 120 capsule 2  . clonazePAM (KLONOPIN) 0.5 MG tablet Take 1 tab po qam and midday and 2 tabs po qhs 120 tablet 2  . cyclobenzaprine (FLEXERIL) 5 MG tablet Take 5 mg by mouth 3 (  three) times daily as needed.     . DULoxetine (CYMBALTA) 20 MG capsule Take 1 capsule (20 mg total) by mouth daily. As directed by physician 90 capsule 0  . levothyroxine (SYNTHROID, LEVOTHROID) 50 MCG tablet Take 1 tablet (50 mcg total) by mouth daily before breakfast. 90 tablet 0  . lisinopril (PRINIVIL,ZESTRIL) 10 MG tablet Take 1 tablet (10 mg total) by mouth daily. 90 tablet 2  . promethazine (PHENERGAN) 25 MG tablet Take 1 tablet (25 mg total) by mouth every 8 (eight) hours as needed for nausea or vomiting (migraine headache). 20 tablet 5  . traZODone (DESYREL) 150 MG tablet TAKE 1/2 TABLET AT BEDTIME 45 tablet 1  . Vitamin D, Ergocalciferol, (DRISDOL) 50000 units CAPS capsule TAKE 1 CAPSULE (50,000 UNITS TOTAL) BY MOUTH EVERY 7 (SEVEN) DAYS 24 capsule 0   No current facility-administered medications on file prior to visit.    Allergies  Allergen Reactions  . Ibuprofen     Abdominal pain  . Nsaids Nausea And Vomiting    Family History  Problem Relation Age of Onset  . Cancer Mother   . Hypertension Father   . Stroke Maternal Grandmother   . Alzheimer's disease Maternal Grandmother   . Cancer Maternal Grandfather   . Diabetes Maternal Grandfather   . Cancer Paternal Grandmother   . Heart failure Paternal Grandfather    Social History   Socioeconomic History  . Marital status: Married    Spouse name: [name]  . Number of children: Not on file  . Years of education: Not on file  . Highest education level: Not on file  Occupational History  . Occupation: therapist    Comment: [employer]  Social Needs  . Financial resource strain: Not on file  . Food insecurity:    Worry: Not on file    Inability: Not on file  . Transportation needs:    Medical: Not on file    Non-medical: Not on file  Tobacco Use  . Smoking status: Former Games developer  . Smokeless tobacco: Never Used  Substance and Sexual Activity  . Alcohol use: No    Alcohol/week: 0.0 oz  . Drug use: No  . Sexual activity: Not on file  Lifestyle  . Physical activity:    Days per week: Not on file    Minutes per session: Not on file  . Stress: Not on file  Relationships  . Social connections:    Talks on phone: Not on file    Gets together: Not on file    Attends religious service: Not on file    Active member of club or organization: Not on file    Attends meetings of clubs or organizations: Not on file    Relationship status: Not on file  Other Topics Concern  . 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[employer]    Married [name]   Depression screen [facility] 2/9 07/03/2017 06/09/2017 05/11/2017 04/23/2017 01/29/2017  Decreased Interest 0 0 0 0 0  Down, Depressed, Hopeless 0 0 0 0 0  PHQ - 2 Score 0 0 0 0 0     Review of Systems  Constitutional: Positive for activity change, appetite change, chills, diaphoresis and fatigue.  Gastrointestinal: Positive for diarrhea and nausea. Negative for vomiting.   Musculoskeletal: Positive for arthralgias and back pain. Negative for joint swelling.  Skin: Positive for pallor. Negative for rash.  Neurological: Positive for tremors, weakness, light-headedness, numbness and  headaches. Negative for seizures, syncope, facial asymmetry and speech difficulty.  Psychiatric/Behavioral: Positive for agitation, behavioral problems, decreased concentration, dysphoric mood and sleep disturbance. Negative for hallucinations, self-injury and suicidal ideas. The patient is nervous/anxious. The patient is not hyperactive.    See hpi    Objective:   Physical Exam  Constitutional: He is oriented to person, place, and time. He appears well-developed and well-nourished. No distress.  HENT:  Head: Normocephalic and atraumatic.  Eyes: No scleral icterus.  Pulmonary/Chest: Effort normal.  Neurological: He is alert and oriented to person, place, and time.  Skin: Skin is warm and dry. He is not diaphoretic.  Psychiatric: He has a normal mood and affect. His behavior is normal.      BP 110/70 (BP Location: Left Arm, Patient Position: Sitting, Cuff Size: Large)   Pulse (!) 127   Temp 98.2 F (36.8 C) (Oral)   Resp 18   Ht 5\' 7"  (1.702 m)   Wt 183 lb 9.6 oz (83.3 kg)   SpO2 98%   BMI 28.76 kg/m      Assessment & Plan:  Over 40 min spent in face-to-face evaluation of and consultation with patient and coordination of care.  Over 50% of this time was spent counseling this patient regarding cymbalta withdrawal approach. 1. Withdrawal from other psychoactive substance (HCC)   2. Medication side effect, subsequent encounter   3. Primary insomnia   4. Chronic pain syndrome      Meds ordered this encounter  Medications  . oxyCODONE (OXY IR/ROXICODONE) 5 MG immediate release tablet    Sig: Take 1-2 tablets (5-10 mg total) by mouth every 4 (four) hours as needed for severe pain.    Dispense:  80 tablet    Refill:  0    For chronic pain  . zolpidem (AMBIEN) 10 MG  tablet    Sig: Take 0.5-1 tablets (5-10 mg total) by mouth at bedtime as needed for sleep. Not within 4 hrs of clonazepam    Dispense:  30 tablet    Refill:  0    I personally performed the services described in this documentation, which was scribed in my presence. The recorded information has been reviewed and considered, and addended by me as needed.   [name], M.D.  Primary Care at [facility] [address] [place], [place] [address] [phone] phone [phone] fax  07/03/17 1:31 PM

## 2017-06-09 NOTE — Progress Notes (Deleted)
Subjective:  By signing my name below, I, Dalton Kelly, attest that this documentation has been prepared under the direction and in the presence of Dalton SorensonEva Shaw, MD. Electronically Signed: Stann Oresung-Kai Kelly, Scribe. 06/09/2017 , 2:50 PM .  Patient was seen in Room 2 .   Patient ID: Dalton Kelly, male    DOB: 08-22-1980, 37 y.o.   MRN: [id number] Chief Complaint  Patient presents with  . Medication Management    Pt would like to discuss coming off of Cymbalta   HPI Dalton Kelly Antilla is a 37 y.o. male who [facility] to [facility] at [facility] for follow up of m  Past Medical History:  Diagnosis Date  . Anxiety   . Back pain   . Barbiturate dependence (HCC)   . Episode of syncope    Recent that sounds vasovagal  . Fatigue   . Generalized anxiety disorder   . Hair loss   . Insomnia   . Irregular bowel habits   . Migraines   . Opioid dependence in controlled environment (HCC)   . Systolic click    Past Surgical History:  Procedure Laterality Date  . WISDOM TOOTH EXTRACTION     Prior to Admission medications   Medication Sig Start Date End Date Taking? Authorizing Provider  AJOVY 225 MG/1.5ML SOSY  01/20/17   [provider]  butalbital-acetaminophen-caffeine (FIORICET WITH CODEINE) 50-325-40-30 MG capsule Take 1-2 capsules by mouth every 6 (six) hours as needed for headache or migraine. Do not take more than 6 tabs/d. 04/23/17   Sherren MochaShaw, Eva N, MD  clonazePAM Scarlette Calico(KLONOPIN) 0.5 MG tablet Take 1 tab po qam and midday and 2 tabs po qhs 04/23/17   Sherren MochaShaw, Eva N, MD  cloNIDine (CATAPRES) 0.2 MG tablet Take 1 tablet (0.2 mg total) by mouth at bedtime. 05/20/17   Sherren MochaShaw, Eva N, MD  cyclobenzaprine (FLEXERIL) 5 MG tablet Take 5 mg by mouth 3 (three) times daily as needed.  10/25/15   Frederico Hammanaffrey, Daniel, MD  DULoxetine (CYMBALTA) 20 MG capsule Take 1 capsule (20 mg total) by mouth daily. As directed by physician 04/30/17   Sherren MochaShaw, Eva N, MD  levothyroxine (SYNTHROID, LEVOTHROID) 50 MCG tablet Take 1  tablet (50 mcg total) by mouth daily before breakfast. 04/23/17   Sherren MochaShaw, Eva N, MD  lisinopril (PRINIVIL,ZESTRIL) 10 MG tablet Take 1 tablet (10 mg total) by mouth daily. 04/23/17   Sherren MochaShaw, Eva N, MD  oxyCODONE (OXY IR/ROXICODONE) 5 MG immediate release tablet Take 1-2 tablets (5-10 mg total) by mouth every 4 (four) hours as needed for severe pain. 05/11/17   Sherren MochaShaw, Eva N, MD  promethazine (PHENERGAN) 25 MG tablet Take 1 tablet (25 mg total) by mouth every 8 (eight) hours as needed for nausea or vomiting (migraine headache). 05/11/17   Sherren MochaShaw, Eva N, MD  traMADol Dalton Kelly(ULTRAM) 50 MG tablet Take by mouth every 6 (six) hours as needed.    [provider]  traZODone (DESYREL) 150 MG tablet TAKE 1/2 TABLET AT BEDTIME 11/28/16   Sherren MochaShaw, Eva N, MD  Vitamin D, Ergocalciferol, (DRISDOL) 50000 units CAPS capsule TAKE 1 CAPSULE (50,000 UNITS TOTAL) BY MOUTH EVERY 7 (SEVEN) DAYS 05/18/17   Sherren MochaShaw, Eva N, MD   Allergies  Allergen Reactions  . Ibuprofen     Abdominal pain  . Nsaids Nausea And Vomiting   Family History  Problem Relation Age of Onset  . Cancer Mother   . Hypertension Father   . Stroke Maternal Grandmother   . Alzheimer's disease  Maternal Grandmother   . Cancer Maternal Grandfather   . Diabetes Maternal Grandfather   . Cancer Paternal Grandmother   . Heart failure Paternal Grandfather    Social History   Socioeconomic History  . Marital status: Married    Spouse name: [name]  . Number of children: Not on file  . Years of education: Not on file  . Highest education level: Not on file  Occupational History  . Occupation: therapist    Comment: [employer]  Social Needs  . Financial resource strain: Not on file  . Food insecurity:    Worry: Not on file    Inability: Not on file  . Transportation needs:    Medical: Not on file    Non-medical: Not on file  Tobacco Use  . Smoking status: Former Games developer  . Smokeless tobacco: Never Used  Substance and Sexual Activity  . Alcohol use: No     Alcohol/week: 0.0 oz  . Drug use: No  . Sexual activity: Not on file  Lifestyle  . Physical activity:    Days per week: Not on file    Minutes per session: Not on file  . Stress: Not on file  Relationships  . Social connections:    Talks on phone: Not on file    Gets together: Not on file    Attends religious service: Not on file    Active member of club or organization: Not on file    Attends meetings of clubs or organizations: Not on file    Relationship status: Not on file  Other Topics Concern  . 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 He appears well-developed and well-nourished. No distress.  HENT:  Head: Normocephalic and atraumatic.  Eyes: Pupils are equal, round, and reactive to light. EOM are normal.  Neck: Neck supple.  Cardiovascular: Normal rate.  Pulmonary/Chest: Effort normal. No respiratory distress.  Musculoskeletal: Normal range of motion.  Neurological: He is alert and oriented to person, place, and time.  Skin: Skin is warm and dry.  Psychiatric: He has a normal mood and affect. His behavior is normal.  Nursing note and vitals reviewed.   BP 110/70 (BP Location: Left Arm, Patient Position: Sitting, Cuff Size: Large)   Pulse (!) 127   Temp 98.2 Kelly (36.8 C) (Oral)   Resp 18   Ht 5\' 7"  (1.702 m)   Wt 183 lb 9.6 oz (83.3 kg)   SpO2 98%   BMI 28.76 kg/m      Assessment & Plan:

## 2017-06-09 NOTE — Patient Instructions (Addendum)
REMEMBER THAT ALL OF THESE MEDICATIONS INTERACT IN UNPREDICTABLE WAYS.  The unknown ways that these meds interact makes them scary and different people will have VERY different outcomes - be careflu and go slow when adding in the new zolpidem and restarting/increasing pain medicine   IF you received an x-ray today, you will receive an invoice from [facility]. Please contact [facility] at [phone] with questions or concerns regarding your invoice.   IF you received labwork today, you will receive an invoice from [facility]. Please contact [facility] at [phone] with questions or concerns regarding your invoice.   Our billing staff will not be able to assist you with questions regarding bills from these companies.  You will be contacted with the lab results as soon as they are available. The fastest way to get your results is to activate your My Chart account. Instructions are located on the last page of this paperwork. If you have not heard from us regarding the results in 2 weeks, please contact this office.     Chronic Pain, Adult Chronic pain is a type of pain that lasts or keeps coming back (recurs) for at least six months. You may have chronic headaches, abdominal pain, or body pain. Chronic pain may be related to an illness, such as fibromyalgia or complex regional pain syndrome. Sometimes the cause of chronic pain is not known. Chronic pain can make it hard for you to do daily activities. If not treated, chronic pain can lead to other health problems, including anxiety and depression. Treatment depends on the cause and severity of your pain. You may need to work with a pain specialist to come up with a treatment plan. The plan may include medicine, counseling, and physical therapy. Many people benefit from a combination of two or more types of treatment to control their pain. Follow these instructions at home: Lifestyle  Consider keeping a pain diary to share with  your health care providers.  Consider talking with a mental health care provider (psychologist) about how to cope with chronic pain.  Consider joining a chronic pain support group.  Try to control or lower your stress levels. Talk to your health care provider about strategies to do this. General instructions   Take over-the-counter and prescription medicines only as told by your health care provider.  Follow your treatment plan as told by your health care provider. This may include: ? Gentle, regular exercise. ? Eating a healthy diet that includes foods such as vegetables, fruits, fish, and lean meats. ? Cognitive or behavioral therapy. ? Working with a Adult nursephysical therapist. ? Meditation or yoga. ? Acupuncture or massage therapy. ? Aroma, color, light, or sound therapy. ? Local electrical stimulation. ? Shots (injections) of numbing or pain-relieving medicines into the spine or the area of pain.  Check your pain level as told by your health care provider. Ask your health care provider if you should use a pain scale.  Learn as much as you can about how to manage your chronic pain. Ask your health care provider if an intensive pain rehabilitation program or a chronic pain specialist would be helpful.  Keep all follow-up visits as told by your health care provider. This is important. Contact a health care provider if:  Your pain gets worse.  You have new pain.  You have trouble sleeping.  You have trouble doing your normal activities.  Your pain is not controlled with treatment.  Your have side effects from pain medicine.  You feel weak.  Get help right away if:  You lose feeling or have numbness in your body.  You lose control of bowel or bladder function.  Your pain suddenly gets much worse.  You develop shaking or chills.  You develop confusion.  You develop chest pain.  You have trouble breathing or shortness of breath.  You pass out.  You have thoughts  about hurting yourself or others. This information is not intended to replace advice given to you by your health care provider. Make sure you discuss any questions you have with your health care provider. Document Released: 11/02/2001 Document Revised: 10/11/2015 Document Reviewed: 07/31/2015 Elsevier Interactive Patient Education  [employer].

## 2017-06-10 ENCOUNTER — Encounter: Payer: Self-pay | Admitting: Family Medicine

## 2017-06-11 ENCOUNTER — Other Ambulatory Visit: Payer: Self-pay | Admitting: Family Medicine

## 2017-07-03 ENCOUNTER — Ambulatory Visit (INDEPENDENT_AMBULATORY_CARE_PROVIDER_SITE_OTHER): Payer: 59 | Admitting: Family Medicine

## 2017-07-03 ENCOUNTER — Encounter: Payer: Self-pay | Admitting: Family Medicine

## 2017-07-03 ENCOUNTER — Other Ambulatory Visit: Payer: Self-pay

## 2017-07-03 VITALS — BP 124/80 | HR 124 | Temp 97.8°F | Resp 18 | Ht 67.0 in | Wt 186.6 lb

## 2017-07-03 DIAGNOSIS — G8929 Other chronic pain: Secondary | ICD-10-CM | POA: Diagnosis not present

## 2017-07-03 DIAGNOSIS — G43109 Migraine with aura, not intractable, without status migrainosus: Secondary | ICD-10-CM

## 2017-07-03 DIAGNOSIS — M545 Low back pain: Secondary | ICD-10-CM | POA: Diagnosis not present

## 2017-07-03 DIAGNOSIS — F411 Generalized anxiety disorder: Secondary | ICD-10-CM | POA: Diagnosis not present

## 2017-07-03 DIAGNOSIS — E039 Hypothyroidism, unspecified: Secondary | ICD-10-CM | POA: Diagnosis not present

## 2017-07-03 DIAGNOSIS — E038 Other specified hypothyroidism: Secondary | ICD-10-CM

## 2017-07-03 DIAGNOSIS — F19982 Other psychoactive substance use, unspecified with psychoactive substance-induced sleep disorder: Secondary | ICD-10-CM

## 2017-07-03 DIAGNOSIS — T50905S Adverse effect of unspecified drugs, medicaments and biological substances, sequela: Secondary | ICD-10-CM | POA: Diagnosis not present

## 2017-07-03 DIAGNOSIS — R5382 Chronic fatigue, unspecified: Secondary | ICD-10-CM

## 2017-07-03 DIAGNOSIS — F19939 Other psychoactive substance use, unspecified with withdrawal, unspecified: Secondary | ICD-10-CM | POA: Diagnosis not present

## 2017-07-03 MED ORDER — OXYCODONE HCL 10 MG PO TABS: 10.0000 mg | ORAL_TABLET | Freq: Two times a day (BID) | ORAL | 0 refills | Status: DC | PRN

## 2017-07-03 MED ORDER — ZOLPIDEM TARTRATE ER 12.5 MG PO TBCR: 12.5000 mg | EXTENDED_RELEASE_TABLET | Freq: Every evening | ORAL | 0 refills | Status: DC | PRN

## 2017-07-03 MED ORDER — CLONAZEPAM 0.5 MG PO TABS: ORAL_TABLET | ORAL | 2 refills | Status: DC

## 2017-07-03 MED ORDER — DULOXETINE HCL 20 MG PO CPEP
20.0000 mg | ORAL_CAPSULE | Freq: Every day | ORAL | 0 refills | Status: DC
Start: 2017-07-03 — End: 2017-09-19

## 2017-07-03 MED ORDER — BUTALBITAL-APAP-CAFF-COD 50-325-40-30 MG PO CAPS: 1.0000 | ORAL_CAPSULE | Freq: Four times a day (QID) | ORAL | 2 refills | Status: DC | PRN

## 2017-07-03 MED ORDER — LEVOTHYROXINE SODIUM 50 MCG PO TABS: 50.0000 ug | ORAL_TABLET | Freq: Every day | ORAL | 0 refills | Status: DC

## 2017-07-03 NOTE — Patient Instructions (Addendum)
   IF you received an x-ray today, you will receive an invoice from [facility]. Please contact [facility] at [phone] with questions or concerns regarding your invoice.   IF you received labwork today, you will receive an invoice from [facility]. Please contact [facility] at [phone] with questions or concerns regarding your invoice.   Our billing staff will not be able to assist you with questions regarding bills from these companies.  You will be contacted with the lab results as soon as they are available. The fastest way to get your results is to activate your My Chart account. Instructions are located on the last page of this paperwork. If you have not heard from us regarding the results in 2 weeks, please contact this office.      Opioid Pain Medicine Information Opioids are powerful medicines that are used to treat moderate to severe pain. Opioids should be taken with the supervision of a trained health care provider. They should be taken for the shortest period of time as possible. This is because opioids can be addictive and the longer you take opioids, the greater your risk of addiction (opioid use disorder). What do opioids do? Opioids help to reduce or eliminate pain. When used for short periods of time, they can help you:  Sleep better.  Do better in physical or occupational therapy.  Feel better in the first few days after an injury.  Recover from surgery.  What is a pain treatment plan? A pain treatment plan is an agreement between you and your health care provider. Pain is unique to each person, and treatments vary depending on your condition. To manage your pain successfully, you and your health care provider need to understand each other and work together. To help you do this:  Discuss the goals of your treatment, including how much pain you might expect to have and how you will manage the pain.  Review the risks and benefits of taking  opioid medicines for your condition.  Remember that a good treatment plan uses more than one approach and minimizes the chance of side effects.  Be honest about the amount of medicines you take, and about any drug or alcohol use.  Get pain medicine prescriptions from only one care provider.  Keep all follow-up visits as told by your health care provider. This is important.  What instructions should I follow while taking opioid pain medicine? While you are taking the medicine and for 8 hours after you stop taking the medicine, follow these instructions:  Do not drive.  Do not use machinery or power tools.  Do not sign legal documents.  Do not drink alcohol.  Do not take sleeping pills.  Do not supervise children by yourself.  Do not participate in activities that require climbing or being in high places.  Do not enter a body of water-such as a lake, river, ocean, spa, or swimming pool-unless an adult is nearby who can monitor and help you.  What kinds of side effects can opioids cause? Opioids can cause side effects, such as:  Constipation.  Nausea.  Vomiting.  Drowsiness.  Confusion.  Opioid use disorder.  Breathing difficulties (respiratory depression).  Using opioid pain medicines for longer than 3 days increases your risk of these side effects. Taking opioid pain medicine for a long period of time can affect your ability to do daily tasks. It also puts you at risk for:  Motor vehicle accidents.  Depression.  Suicide.  Heart attack.    Overdose, which can sometimes lead to death.  What are alternative ways to manage pain? Pain can be managed with many types of alternative treatments. Ask your health care provider to refer you to one or more specialists who can help you manage pain through:  Physical or occupational therapy.  Counseling (cognitive behavioral therapy).  Good nutrition.  Biofeedback.  Massage.  Meditation.  Non-opioid  medicine.  Following a gentle exercise program.  How can I keep others safe while I am taking opioid pain medicine?  Keep pain medicine in a locked cabinet, or in a secure area where children cannot reach it.  Never share your pain medicine with anyone.  Do not save any leftover pills. If you have leftover medicine, you can: 1. Bring the medicine to a prescription take-back program. This is usually offered by the county or law enforcement. 2. Throw it out in the trash. To do this:  Mix the medicine with undesirable trash such as pet waste or food.  Put the mixture in a sealed container or plastic bag.  Throw it in the trash.  Destroy any personal information on the prescription bottle. How do I stop taking opioids if I have been taking them for a long time? If you have been taking opioid medicine for more than a few weeks, you may need to slowly decrease (taper) how much you take until you stop completely. Tapering your use of opioids can decrease your chances of experiencing withdrawal symptoms, such as:  Pain and cramping in the abdomen.  Nausea.  Sweating.  Sleepiness.  Restlessness.  Uncontrollable shaking (tremors).  Cravings for the medicine.  Do not attempt to taper your use of opioids on your own. Talk with your health care provider about how to do this. Your health care provider may prescribe a step-down schedule based on how much medicine you are taking and how long you have been taking it. Where to find support: If you have been taking opioids for a long time, you may benefit from receiving support for quitting from a local support group or counselor. Ask your health care provider for a referral to these resources in your area. Where to find more information:  Centers for Disease Control and Prevention (CDC): www.cdc.gov/drugoverdose/opioids/index.html Get help right away if: Seek medical care right away if you are taking opioids and you (or people close to  you) notice any of the following:  Difficulty breathing.  Breathing that is slower or more shallow than normal.  A very slow heartbeat (pulse).  Severe confusion.  Unconsciousness.  Sleepiness.  Slurred speech.  Nausea and vomiting.  Cold, clammy skin.  Blue lips or fingernails.  Limpness.  Abnormally small pupils.  If you think that you or someone else may have taken too much of an opioid medicine, get medical help right away. Do not wait to see if the symptoms go away on their own.  If you ever feel like you may hurt yourself or others, or have thoughts about taking your own life, get help right away. You can go to your nearest emergency department or call:  Your local emergency services (911 in the [place]  The hotline of the National Poison Control Center (1-800-222-1222 in the [place]  A suicide crisis helpline, such as the National Suicide Prevention Lifeline at 1-800-273-8255. This is open 24 hours a day.  Summary  Opioid medicines can help you manage moderate to severe pain for a short period of time.  Discuss the goals of your treatment   with your health care provider, including how much pain you might expect to have and how you will manage the pain.  A good treatment plan uses more than one approach. Pain can be managed with many types of alternative treatments.  If you think that you or someone else may have taken too much of an opioid, get medical help right away. This information is not intended to replace advice given to you by your health care provider. Make sure you discuss any questions you have with your health care provider. Document Released: 03/09/2015 Document Revised: 05/30/2016 Document Reviewed: 09/22/2014 Elsevier Interactive Patient Education  2018 Elsevier Inc.  

## 2017-07-03 NOTE — Progress Notes (Signed)
Subjective:    Patient ID: Dalton Kelly, male    DOB: October 05, 1980, 37 y.o.   MRN: [id number] Chief Complaint  Patient presents with  . Medication Refill  . Medication Management    Reducing Cymbalta    Chief Complaint  Patient presents with  . Medication Refill  . Medication Management    Reducing Cymbalta      HPI Feels like withdrawal sxs worsened as he has decreased - down to 22-33 beads - has staggered it a little which was recommended - changing between the 2. However, now that he is getting down this low He has not slept very well the last 2 nights so was thinking about staying at this week.  Thinks he has 14 cymbalta left. As has thrown away a lot of them when he was thought he was going to go off. Waking up to early so wondering about trying lunesta or controlled release ambien.  Takes the oxycodone 2 tab - taking 1 is ineffective   Past Medical History:  Diagnosis Date  . Anxiety   . Back pain   . Barbiturate dependence (HCC)   . Episode of syncope    Recent that sounds vasovagal  . Fatigue   . Generalized anxiety disorder   . Hair loss   . Insomnia   . Irregular bowel habits   . Migraines   . Opioid dependence in controlled environment (HCC)   . Systolic click    Past Surgical History:  Procedure Laterality Date  . WISDOM TOOTH EXTRACTION     Current Outpatient Medications on File Prior to Visit  Medication Sig Dispense Refill  . AJOVY 225 MG/1.5ML SOSY     . cyclobenzaprine (FLEXERIL) 5 MG tablet Take 5 mg by mouth 3 (three) times daily as needed.     Marland Kitchen lisinopril (PRINIVIL,ZESTRIL) 10 MG tablet Take 1 tablet (10 mg total) by mouth daily. 90 tablet 2  . promethazine (PHENERGAN) 25 MG tablet Take 1 tablet (25 mg total) by mouth every 8 (eight) hours as needed for nausea or vomiting (migraine headache). 20 tablet 5  . traZODone (DESYREL) 150 MG tablet TAKE 1/2 TABLET AT BEDTIME 45 tablet 1  . Vitamin D, Ergocalciferol, (DRISDOL) 50000 units CAPS  capsule TAKE 1 CAPSULE (50,000 UNITS TOTAL) BY MOUTH EVERY 7 (SEVEN) DAYS 24 capsule 0   No current facility-administered medications on file prior to visit.    Allergies  Allergen Reactions  . Ibuprofen     Abdominal pain  . Nsaids Nausea And Vomiting   Family History  Problem Relation Age of Onset  . Cancer Mother   . Hypertension Father   . Stroke Maternal Grandmother   . Alzheimer's disease Maternal Grandmother   . Cancer Maternal Grandfather   . Diabetes Maternal Grandfather   . Cancer Paternal Grandmother   . Heart failure Paternal Grandfather    Social History   Socioeconomic History  . Marital status: Married    Spouse name: [name]  . Number of children: Not on file  . Years of education: Not on file  . Highest education level: Not on file  Occupational History  . Occupation: therapist    Comment: [employer]  Social Needs  . Financial resource strain: Not on file  . Food insecurity:    Worry: Not on file    Inability: Not on file  . Transportation needs:    Medical: Not on file    Non-medical: Not on file  Tobacco Use  .  Smoking status: Former Games developer  . Smokeless tobacco: Never Used  Substance and Sexual Activity  . Alcohol use: No    Alcohol/week: 0.0 oz  . Drug use: No  . Sexual activity: Not on file  Lifestyle  . Physical activity:    Days per week: Not on file    Minutes per session: Not on file  . Stress: Not on file  Relationships  . Social connections:    Talks on phone: Not on file    Gets together: Not on file    Attends religious service: Not on file    Active member of club or organization: Not on file    Attends meetings of clubs or organizations: Not on file    Relationship status: Not on file  Other Topics Concern  . 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[employer]    Married [name]   Depression screen [facility] 2/9 07/03/2017 06/09/2017 05/11/2017 04/23/2017 01/29/2017  Decreased Interest 0 0 0 0 0    Down, Depressed, Hopeless 0 0 0 0 0  PHQ - 2 Score 0 0 0 0 0     Review of Systems  Constitutional: Positive for activity change, appetite change, diaphoresis and fatigue. Negative for fever.  Musculoskeletal: Positive for arthralgias, back pain and myalgias. Negative for gait problem and joint swelling.  Allergic/Immunologic: Negative for immunocompromised state.  Neurological: Positive for dizziness, tremors, weakness, light-headedness and headaches. Negative for seizures and syncope.  Psychiatric/Behavioral: Positive for agitation, decreased concentration and sleep disturbance. Negative for confusion, dysphoric mood, hallucinations and self-injury. The patient is not nervous/anxious and is not hyperactive.    See hpi    Objective:   Physical Exam  Constitutional: He is oriented to person, place, and time. He appears well-developed and well-nourished. No distress.  HENT:  Head: Normocephalic and atraumatic.  Eyes: No scleral icterus.  Pulmonary/Chest: Effort normal.  Neurological: He is alert and oriented to person, place, and time.  Skin: Skin is warm and dry. He is not diaphoretic.  Psychiatric: He has a normal mood and affect. His behavior is normal.      BP 124/80 (BP Location: Left Arm, Patient Position: Sitting, Cuff Size: Normal)   Pulse (!) 124   Temp 97.8 F (36.6 C) (Oral)   Resp 18   Ht  (1.702 m)   Wt 186 lb 9.6 oz (84.6 kg)   SpO2 99%   BMI 29.23 kg/m      Assessment & Plan:   1. Withdrawal from other psychoactive substance (HCC)   2. Medication side effect, sequela   3. Drug-induced insomnia (HCC)   4. GAD (generalized anxiety disorder)   5. Migraine with aura and without status migrainosus, not intractable   6. Chronic low back pain, unspecified back pain laterality, with sciatica presence unspecified   7. Chronic fatigue   8. Subclinical hypothyroidism    Warned of risks of unintentional OD from opiate, sedative, benzo combo - esp when  restarting prior doses if have been off - need to separate meds.  Pt very distressed by lack of sleep so will try ambien CR as zolpidem only keeps him asleep for a few hrs. If that fails, could try [place] or sonata which are the insurance requirements before we can try belsomra. Refuses trial of seroquel. Cont cymbalta taper.  Meds ordered this encounter  Medications  . DULoxetine (CYMBALTA) 20 MG capsule    Sig: Take 1 capsule (20  mg total) by mouth daily.    Dispense:  90 capsule    Refill:  0  . zolpidem (AMBIEN CR) 12.5 MG CR tablet    Sig: Take 1 tablet (12.5 mg total) by mouth at bedtime as needed for sleep.    Dispense:  30 tablet    Refill:  0    D/C zolpidem 10  . oxyCODONE 10 MG TABS    Sig: Take 1 tablet (10 mg total) by mouth 2 (two) times daily as needed for severe pain.    Dispense:  60 tablet    Refill:  0    For chronic pain  . DISCONTD: clonazePAM (KLONOPIN) 0.5 MG tablet    Sig: Take 1 tab po qam and midday and 2 tabs po qhs    Dispense:  120 tablet    Refill:  2  . levothyroxine (SYNTHROID, LEVOTHROID) 50 MCG tablet    Sig: Take 1 tablet (50 mcg total) by mouth daily before breakfast.    Dispense:  90 tablet    Refill:  0  . DISCONTD: butalbital-acetaminophen-caffeine (FIORICET WITH CODEINE) 50-325-40-30 MG capsule    Sig: Take 1-2 capsules by mouth every 6 (six) hours as needed for headache or migraine. Do not take more than 6 tabs/d.    Dispense:  120 capsule    Refill:  2    May refill no sooner than every 28 days.  . clonazePAM (KLONOPIN) 0.5 MG tablet    Sig: Take 1 tab po qam and midday and 2 tabs po qhs    Dispense:  120 tablet    Refill:  2  . butalbital-acetaminophen-caffeine (FIORICET WITH CODEINE) 50-325-40-30 MG capsule    Sig: Take 1-2 capsules by mouth every 6 (six) hours as needed for headache or migraine. Do not take more than 6 tabs/d.    Dispense:  120 capsule    Refill:  2    May refill no sooner than every 28 days.    I personally  performed the services described in this documentation, which was scribed in my presence. The recorded information has been reviewed and considered, and addended by me as needed.   [name], M.D.  Primary Care at [facility] [address] [place], [place] [address] [phone] phone [phone] fax  07/22/17 8:47 AM

## 2017-07-14 ENCOUNTER — Other Ambulatory Visit: Payer: Self-pay | Admitting: Family Medicine

## 2017-07-14 NOTE — Telephone Encounter (Signed)
Synthroid 50 mcg refill request  LOV 04/13/17 with Dr. Clelia Croft    Was for a 3 month follow up with labs due to increasing her Synthroid.  [facility] [address], [place] - [address] [facility]

## 2017-07-17 ENCOUNTER — Encounter: Payer: Self-pay | Admitting: Family Medicine

## 2017-07-22 ENCOUNTER — Telehealth: Payer: Self-pay

## 2017-07-22 MED ORDER — ESZOPICLONE 3 MG PO TABS
3.0000 mg | ORAL_TABLET | Freq: Every day | ORAL | 0 refills | Status: DC
Start: 2017-07-22 — End: 2017-08-25

## 2017-07-22 NOTE — Telephone Encounter (Signed)
Copied from CRM [id number]. Topic: Quick Communication - See Telephone Encounter >> Jul 17, 2017  4:21 PM Herby Abraham C wrote: CRM for notification. See Telephone encounter for: 07/17/17.  Pt sent provider a My-Chart message, pt would like to make sure that provider see message and advise.   >> Jul 21, 2017  1:00 PM Leafy Ro wrote: Pt is calling and he is now on day number 7 without any sleeping. [facility] [address]    Dr. Clelia Croft returned call 5/29 when she returned to clinic

## 2017-07-30 ENCOUNTER — Ambulatory Visit (INDEPENDENT_AMBULATORY_CARE_PROVIDER_SITE_OTHER): Payer: 59 | Admitting: Family Medicine

## 2017-07-30 ENCOUNTER — Encounter: Payer: Self-pay | Admitting: Family Medicine

## 2017-07-30 ENCOUNTER — Other Ambulatory Visit: Payer: Self-pay

## 2017-07-30 VITALS — BP 118/72 | HR 120 | Temp 98.0°F | Resp 18 | Ht 67.0 in | Wt 189.0 lb

## 2017-07-30 DIAGNOSIS — E039 Hypothyroidism, unspecified: Secondary | ICD-10-CM

## 2017-07-30 DIAGNOSIS — F19982 Other psychoactive substance use, unspecified with psychoactive substance-induced sleep disorder: Secondary | ICD-10-CM

## 2017-07-30 DIAGNOSIS — E038 Other specified hypothyroidism: Secondary | ICD-10-CM

## 2017-07-30 DIAGNOSIS — G894 Chronic pain syndrome: Secondary | ICD-10-CM | POA: Diagnosis not present

## 2017-07-30 DIAGNOSIS — T50905S Adverse effect of unspecified drugs, medicaments and biological substances, sequela: Secondary | ICD-10-CM

## 2017-07-30 DIAGNOSIS — F19939 Other psychoactive substance use, unspecified with withdrawal, unspecified: Secondary | ICD-10-CM

## 2017-07-30 DIAGNOSIS — G8929 Other chronic pain: Secondary | ICD-10-CM

## 2017-07-30 DIAGNOSIS — G43109 Migraine with aura, not intractable, without status migrainosus: Secondary | ICD-10-CM | POA: Diagnosis not present

## 2017-07-30 MED ORDER — OXYCODONE HCL 10 MG PO TABS: 10.0000 mg | ORAL_TABLET | Freq: Two times a day (BID) | ORAL | 0 refills | Status: DC | PRN

## 2017-07-30 MED ORDER — SUVOREXANT 20 MG PO TABS: 20.0000 mg | ORAL_TABLET | Freq: Every day | ORAL | 0 refills | Status: DC

## 2017-07-30 MED ORDER — TEMAZEPAM 30 MG PO CAPS: 30.0000 mg | ORAL_CAPSULE | Freq: Every evening | ORAL | 0 refills | Status: DC | PRN

## 2017-07-30 NOTE — Progress Notes (Signed)
Subjective:    Patient ID: Dalton Kelly, male    DOB: 1981/01/12, 37 y.o.   MRN: [id number] Chief Complaint  Patient presents with  . Withdrawal from other psychoactive substance     follow-up from 5/10    HPI Decreased from 10 to 5 - had to go up temporarily but then back down.  Did go to work last week but missed the week prior  Lunesta helped minimally and cost more grogginess during the day. Doesn't help him fall asleep at all.   Could not sleeping  Left eye blood shot.  Past Medical History:  Diagnosis Date  . Anxiety   . Back pain   . Barbiturate dependence (HCC)   . Episode of syncope    Recent that sounds vasovagal  . Fatigue   . Generalized anxiety disorder   . Hair loss   . Insomnia   . Irregular bowel habits   . Migraines   . Opioid dependence in controlled environment (HCC)   . Systolic click    Past Surgical History:  Procedure Laterality Date  . WISDOM TOOTH EXTRACTION     Current Outpatient Medications on File Prior to Visit  Medication Sig Dispense Refill  . AJOVY 225 MG/1.5ML SOSY     . butalbital-acetaminophen-caffeine (FIORICET WITH CODEINE) 50-325-40-30 MG capsule Take 1-2 capsules by mouth every 6 (six) hours as needed for headache or migraine. Do not take more than 6 tabs/d. 120 capsule 2  . clonazePAM (KLONOPIN) 0.5 MG tablet Take 1 tab po qam and midday and 2 tabs po qhs 120 tablet 2  . cyclobenzaprine (FLEXERIL) 5 MG tablet Take 5 mg by mouth 3 (three) times daily as needed.     . DULoxetine (CYMBALTA) 20 MG capsule Take 1 capsule (20 mg total) by mouth daily. 90 capsule 0  . Eszopiclone 3 MG TABS Take 1 tablet (3 mg total) by mouth at bedtime. Take immediately before bedtime 30 tablet 0  . levothyroxine (SYNTHROID, LEVOTHROID) 50 MCG tablet Take 1 tablet (50 mcg total) by mouth daily before breakfast. 90 tablet 0  . lisinopril (PRINIVIL,ZESTRIL) 10 MG tablet Take 1 tablet (10 mg total) by mouth daily. 90 tablet 2  . promethazine  (PHENERGAN) 25 MG tablet Take 1 tablet (25 mg total) by mouth every 8 (eight) hours as needed for nausea or vomiting (migraine headache). 20 tablet 5  . traZODone (DESYREL) 150 MG tablet TAKE 1/2 TABLET AT BEDTIME 45 tablet 1  . Vitamin D, Ergocalciferol, (DRISDOL) 50000 units CAPS capsule TAKE 1 CAPSULE (50,000 UNITS TOTAL) BY MOUTH EVERY 7 (SEVEN) DAYS 24 capsule 0   No current facility-administered medications on file prior to visit.    Allergies  Allergen Reactions  . Ibuprofen     Abdominal pain  . Nsaids Nausea And Vomiting   Family History  Problem Relation Age of Onset  . Cancer Mother   . Hypertension Father   . Stroke Maternal Grandmother   . Alzheimer's disease Maternal Grandmother   . Cancer Maternal Grandfather   . Diabetes Maternal Grandfather   . Cancer Paternal Grandmother   . Heart failure Paternal Grandfather    Social History   Socioeconomic History  . Marital status: Married    Spouse name: [name]  . Number of children: Not on file  . Years of education: Not on file  . Highest education level: Not on file  Occupational History  . Occupation: therapist    Comment: [employer]  Social Needs  .  Financial resource strain: Not on file  . Food insecurity:    Worry: Not on file    Inability: Not on file  . Transportation needs:    Medical: Not on file    Non-medical: Not on file  Tobacco Use  . Smoking status: Former Games developermoker  . Smokeless tobacco: Never Used  Substance and Sexual Activity  . Alcohol use: No    Alcohol/week: 0.0 oz  . Drug use: No  . Sexual activity: Not on file  Lifestyle  . Physical activity:    Days per week: Not on file    Minutes per session: Not on file  . Stress: Not on file  Relationships  . Social connections:    Talks on phone: Not on file    Gets together: Not on file    Attends religious service: Not on file    Active member of club or organization: Not on file    Attends meetings of clubs or organizations: Not on  file    Relationship status: Not on file  Other Topics Concern  . 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[employer]    Married [name]- [place]   [facility] 2[facility]9 07/30/2017 07/03/2017 06/09/2017 05/11/2017 04/23/2017  Decreased Interest 0 0 0 0 0  Down, Depressed, Hopeless 0 0 0 0 0  PHQ - 2 Score 0 0 0 0 0     Review of Systems  Constitutional: Positive for activity change, appetite change and fatigue. Negative for fever and unexpected weight change.  Eyes: Positive for photophobia and redness. Negative for visual disturbance.  Respiratory: Negative for cough.   Cardiovascular: Negative for leg swelling.  Gastrointestinal: Positive for nausea. Negative for abdominal pain and vomiting.  Musculoskeletal: Positive for arthralgias, back pain, myalgias, neck pain and neck stiffness. Negative for joint swelling.  Skin: Positive for pallor.  Allergic/Immunologic: Positive for environmental allergies. Negative for immunocompromised state.  Neurological: Positive for dizziness, tremors, light-headedness and headaches. Negative for seizures, syncope and speech difficulty.  Psychiatric/Behavioral: Positive for sleep disturbance.       Objective:   Physical Exam  Constitutional: He is oriented to person, place, and time. He appears well-developed and well-nourished. He appears lethargic. No distress.  HENT:  Head: Normocephalic and atraumatic.  Eyes: Pupils are equal, round, and reactive to light. Conjunctivae are normal. No scleral icterus.  Neck: Normal range of motion. Neck supple. No thyromegaly present.  Cardiovascular: Regular rhythm, S1 normal, normal heart sounds and intact distal pulses. Tachycardia present.  No murmur heard. Pulmonary/Chest: Effort normal and breath sounds normal. No respiratory distress.  Musculoskeletal: He exhibits no edema.  Lymphadenopathy:    He has no cervical adenopathy.  Neurological: He is oriented to person, place, and  time. He appears lethargic.  Skin: Skin is warm and dry. He is not diaphoretic.  Psychiatric: He has a normal mood and affect. His behavior is normal.    BP 118/72   Pulse (!) 120   Temp 98 F (36.7 C) (Oral)   Resp 18   Ht 5\' 7"  (1.702 m)   Wt 189 lb (85.7 kg)   SpO2 95%   BMI 29.60 kg/m      Assessment & Plan:   1. Subclinical hypothyroidism -started treatment 12/01/16 with levothyroxine 25 which we increased to levothyroxine 50 mcg 04/23/17 - recheck thyroid labs now that has been on new dose x 3 months now.  2. Withdrawal from other psychoactive substance (  HCC) - down to only 5 beads!! Rec go down by 1 bead a week - very low taper as largely psychological  3. Medication side effect, sequela   4. Drug-induced insomnia (HCC) - failed zolpidem 10, ambien cr 12.5 lunesta 3, trazodone 150 - would like for pt to try belsomra but insurance needs PA with trial/failure of everything else first which unfortunately he is quickly accumulating. Gave rx for belsomra but if can't fill as waiting on PA, then try restoril 30 in the interim - counselled that same class as klonopin so cna have addictive/dependent/abuse potential, could decrease efficacy of klonopin, could cause sz if stopped suddenly after long-term use, could cause unintentional ok if mixed w/ klonopin, any other sleep meds such as listed above, and/or any pain med like tramadol and/or oxycodone.  5. Other chronic pain - usually on hydrocodone then was on tramadol by orthro - has had dffuse flu-like arthralgias/myaglias with cymbalta w/d that have been debilitated for pt - currently taking oxycodone 10 bid - not on pain contract w/ myself or ortho as we will wean off narcotics once he if off of cymbalta - agrees that he is not using/filling tramadol from ortho while I am rxing opioids.  6. Chronic pain syndrome   7. Migraine with aura and without status migrainosus, not intractable     Orders Placed This Encounter  Procedures  .  TSH+T4F+T3Free    Meds ordered this encounter  Medications  . temazepam (RESTORIL) 30 MG capsule    Sig: Take 1 capsule (30 mg total) by mouth at bedtime as needed for sleep.    Dispense:  30 capsule    Refill:  0  . DISCONTD: Suvorexant (BELSOMRA) 20 MG TABS    Sig: Take 20 mg by mouth at bedtime.    Dispense:  10 tablet    Refill:  0  . Suvorexant (BELSOMRA) 20 MG TABS    Sig: Take 20 mg by mouth at bedtime.    Dispense:  30 tablet    Refill:  0  . Oxycodone HCl 10 MG TABS    Sig: Take 1 tablet (10 mg total) by mouth 2 (two) times daily as needed.    Dispense:  60 tablet    Refill:  0    For chronic pain    I personally performed the services described in this documentation, which was scribed in my presence. The recorded information has been reviewed and considered, and addended by me as needed.   [name], M.D.  Primary Care at [facility] [address] [place], [place] [address] [phone] phone [phone] fax  08/14/17 3:55 PM

## 2017-07-30 NOTE — Patient Instructions (Signed)
     IF you received an x-ray today, you will receive an invoice from [facility]. Please contact [facility] at [phone] with questions or concerns regarding your invoice.   IF you received labwork today, you will receive an invoice from [facility]. Please contact [facility] at [phone] with questions or concerns regarding your invoice.   Our billing staff will not be able to assist you with questions regarding bills from these companies.  You will be contacted with the lab results as soon as they are available. The fastest way to get your results is to activate your My Chart account. Instructions are located on the last page of this paperwork. If you have not heard from us regarding the results in 2 weeks, please contact this office.     

## 2017-07-31 LAB — TSH+T4F+T3FREE
Free T4: 1.32 ng/dL (ref 0.82–1.77)
T3, Free: 3.3 pg/mL (ref 2.0–4.4)
TSH: 2.34 u[IU]/mL (ref 0.450–4.500)

## 2017-08-03 ENCOUNTER — Encounter: Payer: Self-pay | Admitting: Family Medicine

## 2017-08-06 ENCOUNTER — Encounter: Payer: Self-pay | Admitting: Family Medicine

## 2017-08-10 ENCOUNTER — Encounter: Payer: Self-pay | Admitting: Family Medicine

## 2017-08-17 DIAGNOSIS — R079 Chest pain, unspecified: Secondary | ICD-10-CM | POA: Diagnosis not present

## 2017-08-17 DIAGNOSIS — R0789 Other chest pain: Secondary | ICD-10-CM | POA: Diagnosis not present

## 2017-08-18 ENCOUNTER — Ambulatory Visit: Payer: Self-pay

## 2017-08-18 NOTE — Telephone Encounter (Signed)
Pt. Reports he "collapsed last night at home. I didn't lose consciousness.My husband called EMS and they came and checked me out." "I have titrating off my Cymbalta and I feel like that was part of it. I also have migraines all the time and I don't sleep." States EMS did VS and EKG. "I didn't want to go to the hospital." Pt. Has an appointment for tomorrow and states his [place] come with him.Pt. Asks "what should I do today?" Instructed to continues medications as per provider instructions, rest and stay hydrated. Instructed if he has another episode to call 911 or go to ED.Verbalizes understanding.  Reason for Disposition . [1] MODERATE headache (e.g., interferes with normal activities) AND [2] present > 24 hours AND [3] unexplained  (Exceptions: analgesics not tried, typical migraine, or headache part of viral illness)  Answer Assessment - Initial Assessment Questions 1. LOCATION: "Where does it hurt?"      Left side of head 2. ONSET: "When did the headache start?" (Minutes, hours or days)      I always have a headache 3. PATTERN: "Does the pain come and go, or has it been constant since it started?"     Comes and goes 4. SEVERITY: "How bad is the pain?" and "What does it keep you from doing?"  (e.g., Scale 1-10; mild, moderate, or severe)   - MILD (1-3): doesn't interfere with normal activities    - MODERATE (4-7): interferes with normal activities or awakens from sleep    - SEVERE (8-10): excruciating pain, unable to do any normal activities        5 5. RECURRENT SYMPTOM: "Have you ever had headaches before?" If so, ask: "When was the last time?" and "What happened that time?"      Yes 6. CAUSE: "What do you think is causing the headache?"     I have migraines all the time 7. MIGRAINE: "Have you been diagnosed with migraine headaches?" If so, ask: "Is this headache similar?"      Yes - it's a migraine 8. HEAD INJURY: "Has there been any recent injury to the  head?"      No 9. OTHER SYMPTOMS: "Do you have any other symptoms?" (fever, stiff neck, eye pain, sore throat, cold symptoms)     I collapsed last night at home. 10. PREGNANCY: "Is there any chance you are pregnant?" "When was your last menstrual period?"       N/A  Protocols used: HEADACHE-A-AH

## 2017-08-19 ENCOUNTER — Ambulatory Visit (INDEPENDENT_AMBULATORY_CARE_PROVIDER_SITE_OTHER): Payer: 59

## 2017-08-19 ENCOUNTER — Other Ambulatory Visit: Payer: Self-pay

## 2017-08-19 ENCOUNTER — Encounter: Payer: Self-pay | Admitting: Family Medicine

## 2017-08-19 ENCOUNTER — Ambulatory Visit (INDEPENDENT_AMBULATORY_CARE_PROVIDER_SITE_OTHER): Payer: 59 | Admitting: Family Medicine

## 2017-08-19 VITALS — BP 112/76 | HR 105 | Temp 97.8°F | Resp 16 | Ht 67.0 in | Wt 191.2 lb

## 2017-08-19 DIAGNOSIS — E860 Dehydration: Secondary | ICD-10-CM | POA: Diagnosis not present

## 2017-08-19 DIAGNOSIS — R9431 Abnormal electrocardiogram [ECG] [EKG]: Secondary | ICD-10-CM

## 2017-08-19 DIAGNOSIS — R55 Syncope and collapse: Secondary | ICD-10-CM

## 2017-08-19 DIAGNOSIS — F19982 Other psychoactive substance use, unspecified with psychoactive substance-induced sleep disorder: Secondary | ICD-10-CM

## 2017-08-19 DIAGNOSIS — Z79899 Other long term (current) drug therapy: Secondary | ICD-10-CM

## 2017-08-19 DIAGNOSIS — R5383 Other fatigue: Secondary | ICD-10-CM | POA: Diagnosis not present

## 2017-08-19 DIAGNOSIS — M791 Myalgia, unspecified site: Secondary | ICD-10-CM | POA: Diagnosis not present

## 2017-08-19 DIAGNOSIS — G894 Chronic pain syndrome: Secondary | ICD-10-CM

## 2017-08-19 DIAGNOSIS — R5382 Chronic fatigue, unspecified: Secondary | ICD-10-CM

## 2017-08-19 DIAGNOSIS — T50905S Adverse effect of unspecified drugs, medicaments and biological substances, sequela: Secondary | ICD-10-CM

## 2017-08-19 LAB — POCT URINALYSIS DIP (MANUAL ENTRY)
BILIRUBIN UA: NEGATIVE
BILIRUBIN UA: NEGATIVE mg/dL
GLUCOSE UA: NEGATIVE mg/dL
Leukocytes, UA: NEGATIVE
Nitrite, UA: NEGATIVE
Protein Ur, POC: NEGATIVE mg/dL
RBC UA: NEGATIVE
SPEC GRAV UA: 1.01 (ref 1.010–1.025)
Urobilinogen, UA: 0.2 E.U./dL
pH, UA: 7 (ref 5.0–8.0)

## 2017-08-19 IMAGING — DX DG CHEST 2V
2 series · 2 of 2 positions shown · non-contrast
Comparison: None.

CLINICAL DATA: Near syncope and fatigue.

EXAM:
CHEST - 2 VIEW

[chest pa]
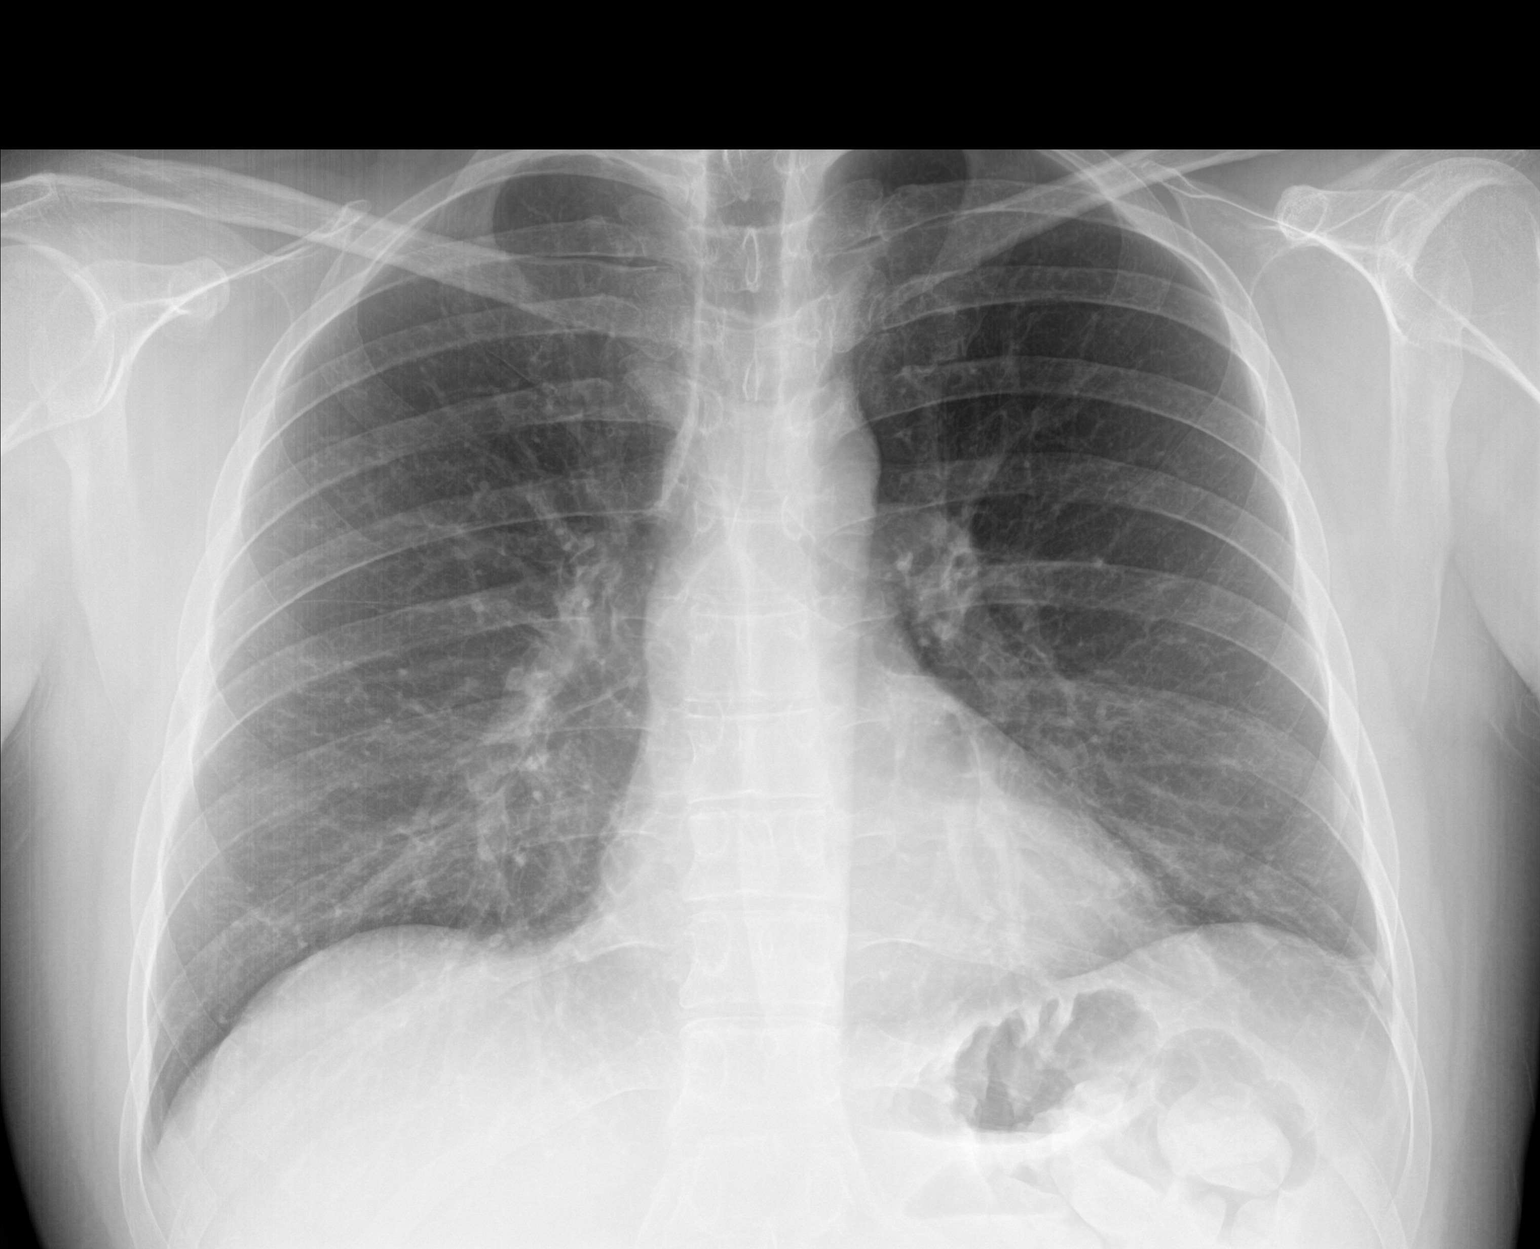

[chest lat]
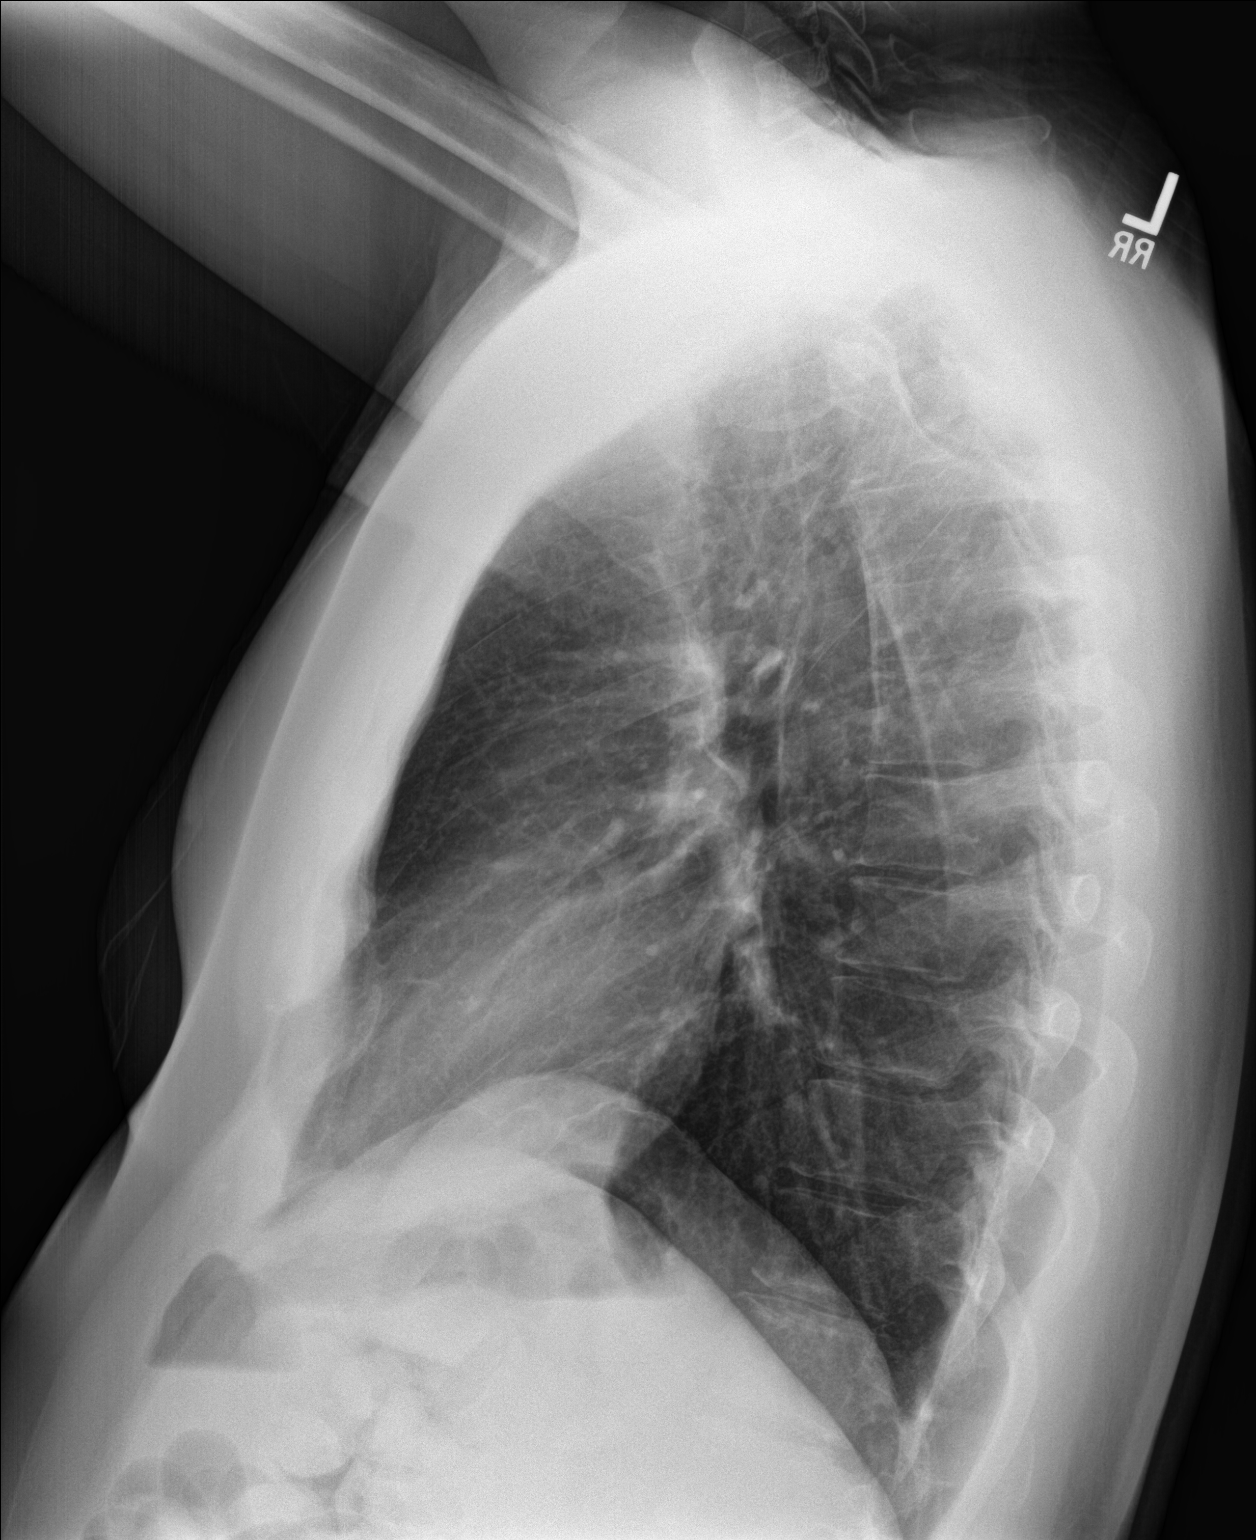

[2 of 2 positions shown; findings below may reference images not displayed]

FINDINGS: The heart size and mediastinal contours are within normal limits.
Both lungs are clear. The visualized skeletal structures are
unremarkable.
IMPRESSION: No active cardiopulmonary disease.

## 2017-08-19 MED ORDER — NALOXONE HCL 4 MG/0.1ML NA LIQD: 1.0000 | Freq: Once | NASAL | 0 refills | Status: AC

## 2017-08-19 MED ORDER — PROPRANOLOL HCL 20 MG PO TABS: 20.0000 mg | ORAL_TABLET | Freq: Four times a day (QID) | ORAL | 0 refills | Status: DC

## 2017-08-19 NOTE — Patient Instructions (Addendum)
You can increase to three times a day on the oxycodone.   We will be able to go down from 5-4-3-2-1 beads - decrease by one bead each week.    IF you received an x-ray today, you will receive an invoice from [facility]. Please contact [facility] at [phone] with questions or concerns regarding your invoice.   IF you received labwork today, you will receive an invoice from [facility]. Please contact [facility] at [phone] with questions or concerns regarding your invoice.   Our billing staff will not be able to assist you with questions regarding bills from these companies.  You will be contacted with the lab results as soon as they are available. The fastest way to get your results is to activate your My Chart account. Instructions are located on the last page of this paperwork. If you have not heard from us regarding the results in 2 weeks, please contact this office.

## 2017-08-19 NOTE — Progress Notes (Deleted)
Subjective:  By signing my name below, I, [name], attest that this documentation has been prepared under the direction and in the presence of [name], MD. Electronically Signed: [name], Scribe. 08/19/2017 , 4:57 PM .  Patient was seen in Room 2 .   Patient ID: Dalton Kelly, male    DOB: Jul 28, 1980, 37 y.o.   MRN: [id number] Chief Complaint  Patient presents with  . Fatigue    called ambulance Monday due to exhaustion, didn't go to ER   HPI Dalton Kelly is a 37 y.o. male who presents to Primary Care at [facility] complaining of fatigue.   [3:57 PM] Orthostatics negative, HR and BP increased with standing; patient has been chronically tachycardic for the past year; no prior EKG on chart.    Past Medical History:  Diagnosis Date  . Anxiety   . Back pain   . Barbiturate dependence (HCC)   . Episode of syncope    Recent that sounds vasovagal  . Fatigue   . Generalized anxiety disorder   . Hair loss   . Insomnia   . Irregular bowel habits   . Migraines   . Opioid dependence in controlled environment (HCC)   . Systolic click    Past Surgical History:  Procedure Laterality Date  . WISDOM TOOTH EXTRACTION     Prior to Admission medications   Medication Sig Start Date End Date Taking? Authorizing Provider  AJOVY 225 MG/1.5ML SOSY  01/20/17   [provider]  butalbital-acetaminophen-caffeine (FIORICET WITH CODEINE) 50-325-40-30 MG capsule Take 1-2 capsules by mouth every 6 (six) hours as needed for headache or migraine. Do not take more than 6 tabs/d. 07/15/17   Sherren Mocha, MD  clonazePAM Scarlette Calico) 0.5 MG tablet Take 1 tab po qam and midday and 2 tabs po qhs 07/21/17   Sherren Mocha, MD  cyclobenzaprine (FLEXERIL) 5 MG tablet Take 5 mg by mouth 3 (three) times daily as needed.  10/25/15   Frederico Hamman, MD  DULoxetine (CYMBALTA) 20 MG capsule Take 1 capsule (20 mg total) by mouth daily. 07/03/17   Sherren Mocha, MD  Eszopiclone 3 MG TABS Take 1 tablet (3  mg total) by mouth at bedtime. Take immediately before bedtime 07/22/17   Sherren Mocha, MD  levothyroxine (SYNTHROID, LEVOTHROID) 50 MCG tablet Take 1 tablet (50 mcg total) by mouth daily before breakfast. 07/03/17   Sherren Mocha, MD  lisinopril (PRINIVIL,ZESTRIL) 10 MG tablet Take 1 tablet (10 mg total) by mouth daily. 04/23/17   Sherren Mocha, MD  Oxycodone HCl 10 MG TABS Take 1 tablet (10 mg total) by mouth 2 (two) times daily as needed. 07/30/17   Sherren Mocha, MD  promethazine (PHENERGAN) 25 MG tablet Take 1 tablet (25 mg total) by mouth every 8 (eight) hours as needed for nausea or vomiting (migraine headache). 05/11/17   Sherren Mocha, MD  Suvorexant (BELSOMRA) 20 MG TABS Take 20 mg by mouth at bedtime. 07/30/17   Sherren Mocha, MD  temazepam (RESTORIL) 30 MG capsule Take 1 capsule (30 mg total) by mouth at bedtime as needed for sleep. 07/30/17   Sherren Mocha, MD  traZODone (DESYREL) 150 MG tablet TAKE 1/2 TABLET AT BEDTIME 11/28/16   Sherren Mocha, MD  Vitamin D, Ergocalciferol, (DRISDOL) 50000 units CAPS capsule TAKE 1 CAPSULE (50,000 UNITS TOTAL) BY MOUTH EVERY 7 (SEVEN) DAYS 05/18/17   Sherren Mocha, MD   Allergies  Allergen Reactions  .  Ibuprofen     Abdominal pain  . Nsaids Nausea And Vomiting   Family History  Problem Relation Age of Onset  . Cancer Mother   . Hypertension Father   . Stroke Maternal Grandmother   . Alzheimer's disease Maternal Grandmother   . Cancer Maternal Grandfather   . Diabetes Maternal Grandfather   . Cancer Paternal Grandmother   . Heart failure Paternal Grandfather    Social History   Socioeconomic History  . Marital status: Married    Spouse name: [name]  . Number of children: Not on file  . Years of education: Not on file  . Highest education level: Not on file  Occupational History  . Occupation: therapist    Comment: [employer]  Social Needs  . Financial resource strain: Not on file  . Food insecurity:    Worry: Not on file    Inability: Not on file  .  Transportation needs:    Medical: Not on file    Non-medical: Not on file  Tobacco Use  . Smoking status: Former Games developer  . Smokeless tobacco: Never Used  Substance and Sexual Activity  . Alcohol use: No    Alcohol/week: 0.0 oz  . Drug use: No  . Sexual activity: Not on file  Lifestyle  . Physical activity:    Days per week: Not on file    Minutes per session: Not on file  . Stress: Not on file  Relationships  . Social connections:    Talks on phone: Not on file    Gets together: Not on file    Attends religious service: Not on file    Active member of club or organization: Not on file    Attends meetings of clubs or organizations: Not on file    Relationship status: Not on file  Other Topics Concern  . 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[employer]    Married [name]   Depression screen [facility] 2/9 08/19/2017 07/30/2017 07/03/2017 06/09/2017 05/11/2017  Decreased Interest 0 0 0 0 0  Down, Depressed, Hopeless 0 0 0 0 0  PHQ - 2 Score 0 0 0 0 0    Review of Systems  Constitutional: Positive for fatigue.  Neurological: Negative for syncope.       Objective:   Physical Exam  Constitutional: He is oriented to person, place, and time. He appears well-developed and well-nourished. No distress.  HENT:  Head: Normocephalic and atraumatic.  Eyes: Pupils are equal, round, and reactive to light. EOM are normal.  Neck: Neck supple.  Cardiovascular: Normal rate.  Pulmonary/Chest: Effort normal. No respiratory distress.  Musculoskeletal: Normal range of motion.  Neurological: He is alert and oriented to person, place, and time.  Skin: Skin is warm and dry.  Psychiatric: He has a normal mood and affect. His behavior is normal.  Nursing note and vitals reviewed.   BP 112/76   Pulse (!) 105   Temp 97.8 F (36.6 C)   Resp 16   Ht 5\' 7"  (1.702 m)   Wt 191 lb 3.2 oz (86.7 kg)   SpO2 100%   BMI 29.95 kg/m   EKG: NSR, no acute ischemic changes  noted. Notable R-wave in v1, causing no R-wave progression. No prior EKG for comparison.  I have personally reviewed the EKG tracing and agree with the computer interpretation.   Results for orders placed or performed in visit on 08/19/17  POCT urinalysis dipstick  Result Value Ref Range   Color, UA yellow yellow   Clarity, UA clear clear   Glucose, UA negative negative mg/dL   Bilirubin, UA negative negative   Ketones, POC UA negative negative mg/dL   Spec Grav, UA 1.914 7.829 - 1.025   Blood, UA negative negative   pH, UA 7.0 5.0 - 8.0   Protein Ur, POC negative negative mg/dL   Urobilinogen, UA 0.2 0.2 or 1.0 E.U./dL   Nitrite, UA Negative Negative   Leukocytes, UA Negative Negative   Peak flow 580 Predicted peak flow: 631  Dg Chest 2 View  Result Date: 08/19/2017 CLINICAL DATA:  Near syncope and fatigue. EXAM: CHEST - 2 VIEW COMPARISON:  None. FINDINGS: The heart size and mediastinal contours are within normal limits. Both lungs are clear. The visualized skeletal structures are unremarkable. IMPRESSION: No active cardiopulmonary disease. Electronically Signed   By: Sherian Rein M.D.   On: 08/19/2017 18:34      Assessment & Plan:   1. Near syncope   2. Dehydration   3. Nonspecific abnormal electrocardiogram (ECG) (EKG)   4. Myalgia    Thinks fatigue is due to DC visit.  Going to stay w/ parents who live in [place].  He is out of work 2 weeks ago and taking off on medical leave until at least than end of July - maybe August.  He is going to wean down on a 5-4-3-2-1 taper on the cymbalta.  Doesn't just wants to stop nor does he want to go up to stablize.  Currently on 4 tabs a day on Fioricet.  Really hopes that after this past month when he is off the past month of the cymbalta he is going to be able to come off.  The temazepam seems to work on a large basis and the oxycodone is helping control his pain.  Is seeing his therapist again who is worried about his anxiety  and lack of sleep.   For the past 2 weeks, he went up to a whole tab of lisinopril but will go back a half.   Is always in pain and the pain wakes him up - he is very cautious of going up on the pain medication  Disjointed speech, slurring, thought organnization, hard to breath, high anxiety, lack of appetite, sick feeling, constant high anxiety, can't calm him down or say the right things so he doesn't know what to do.    Orders Placed This Encounter  Procedures  . DG Chest 2 View    Standing Status:   Future    Number of Occurrences:   1    Standing Expiration Date:   08/19/2018    Order Specific Question:   Reason for Exam (SYMPTOM  OR DIAGNOSIS REQUIRED)    Answer:   near syncope, fatigue, abnml EKG wiht RVH +/- pulmonary disease    Order Specific Question:   Preferred imaging location?    Answer:   External  . TSH+T4F+T3Free  . CBC with Differential/Platelet  . Comprehensive metabolic panel  . Sedimentation Rate  . C-reactive protein  . Lipase  . CK  . Orthostatic vital signs  . Care order/instruction:    Scheduling Instructions:     Peak Flow (IF NEB IS ORDERED PLEASE DO BEFORE AND AFTER NEB)  . POCT urinalysis dipstick  . EKG 12-Lead  . Insert peripheral IV    No orders of the defined types were placed in this encounter.   [name], M.D.  Primary  Care at [facility]  [place] [address] [place], [place] [address] [phone] phone [phone] fax  08/19/17 7:20 PM

## 2017-08-20 LAB — LIPASE: Lipase: 23 U/L (ref 13–78)

## 2017-08-20 LAB — CBC WITH DIFFERENTIAL/PLATELET
Basophils Absolute: 0 10*3/uL (ref 0.0–0.2)
Basos: 0 %
EOS (ABSOLUTE): 0.3 10*3/uL (ref 0.0–0.4)
Eos: 4 %
Hematocrit: 42.5 % (ref 37.5–51.0)
Hemoglobin: 15.1 g/dL (ref 13.0–17.7)
Immature Grans (Abs): 0 10*3/uL (ref 0.0–0.1)
Immature Granulocytes: 0 %
Lymphocytes Absolute: 1.8 10*3/uL (ref 0.7–3.1)
Lymphs: 21 %
MCH: 31.5 pg (ref 26.6–33.0)
MCHC: 35.5 g/dL (ref 31.5–35.7)
MCV: 89 fL (ref 79–97)
Monocytes Absolute: 0.6 10*3/uL (ref 0.1–0.9)
Monocytes: 7 %
Neutrophils Absolute: 5.8 10*3/uL (ref 1.4–7.0)
Neutrophils: 68 %
Platelets: 264 10*3/uL (ref 150–450)
RBC: 4.79 x10E6/uL (ref 4.14–5.80)
RDW: 13.4 % (ref 12.3–15.4)
WBC: 8.6 10*3/uL (ref 3.4–10.8)

## 2017-08-20 LAB — COMPREHENSIVE METABOLIC PANEL WITH GFR
ALT: 22 [IU]/L (ref 0–44)
AST: 21 [IU]/L (ref 0–40)
Albumin/Globulin Ratio: 1.8 (ref 1.2–2.2)
Albumin: 4.5 g/dL (ref 3.5–5.5)
Alkaline Phosphatase: 102 [IU]/L (ref 39–117)
BUN/Creatinine Ratio: 10 (ref 9–20)
BUN: 9 mg/dL (ref 6–20)
Bilirubin Total: <0.2 mg/dL (ref 0.0–1.2)
CO2: 24 mmol/L (ref 20–29)
Calcium: 9.4 mg/dL (ref 8.7–10.2)
Chloride: 102 mmol/L (ref 96–106)
Creatinine, Ser: 0.9 mg/dL (ref 0.76–1.27)
GFR calc Af Amer: 126 mL/min/{1.73_m2}
GFR calc non Af Amer: 109 mL/min/{1.73_m2}
Globulin, Total: 2.5 g/dL (ref 1.5–4.5)
Glucose: 111 mg/dL — ABNORMAL HIGH (ref 65–99)
Potassium: 4.5 mmol/L (ref 3.5–5.2)
Sodium: 140 mmol/L (ref 134–144)
Total Protein: 7 g/dL (ref 6.0–8.5)

## 2017-08-20 LAB — CK: Total CK: 75 U/L (ref 24–204)

## 2017-08-20 LAB — C-REACTIVE PROTEIN: CRP: 9 mg/L (ref 0–10)

## 2017-08-20 LAB — TSH+T4F+T3FREE
Free T4: 1.18 ng/dL (ref 0.82–1.77)
T3, Free: 3.4 pg/mL (ref 2.0–4.4)
TSH: 0.666 u[IU]/mL (ref 0.450–4.500)

## 2017-08-20 LAB — SEDIMENTATION RATE: Sed Rate: 2 mm/h (ref 0–15)

## 2017-08-25 ENCOUNTER — Telehealth: Payer: Self-pay | Admitting: Family Medicine

## 2017-08-25 MED ORDER — TEMAZEPAM 30 MG PO CAPS: 30.0000 mg | ORAL_CAPSULE | Freq: Every evening | ORAL | 0 refills | Status: DC | PRN

## 2017-08-25 MED ORDER — OXYCODONE HCL 10 MG PO TABS: 10.0000 mg | ORAL_TABLET | Freq: Three times a day (TID) | ORAL | 0 refills | Status: DC | PRN

## 2017-08-25 NOTE — Telephone Encounter (Signed)
WHEN DOES PATIENT NEED TO COME IN

## 2017-08-25 NOTE — Telephone Encounter (Signed)
Error

## 2017-08-25 NOTE — Telephone Encounter (Signed)
Copied from CRM [id number]. Topic: General - Other >> Aug 25, 2017 11:05 AM Elliot GaultBell, Tiffany M wrote: Relation to pt: self  Call back number: [phone]   Reason for call:  Patient last seen 08/20/14 and states PCP scheduled him to come back 08/25/17 at 4:20pm, informed patient he was scheduled for Friday 08/28/17 at 4:40pm. Patient states it's very important he's seen today due to PCP advising he needed another EKG, patient would like to speak with nurse, please advise

## 2017-08-25 NOTE — Telephone Encounter (Signed)
Requests refills of temazepam and oxycodone to be sent to pharmacy since there was a scheudling snafu and he is not being seen today when was told he had an appointment at 4;20 today

## 2017-08-25 NOTE — Telephone Encounter (Deleted)
Copied from CRM [phone]. Topic: Quick Communication - Rx Refill/Question >> Aug 25, 2017  3:32 PM Zada GirtLander, Lumin L wrote: Medication: temazepam (RESTORIL) 30 MG capsule,Oxycodone HCl 10 MG TABS (patient says he spoke with Delores today but I don't see any documentation. He said she said she'd call this in right away)  Has the patient contacted their pharmacy? Yes.   (Agent: If no, request that the patient contact the pharmacy for the refill.) (Agent: If yes, when and what did the pharmacy advise?)  Preferred Pharmacy (with phone number or street name): [id number] [place] - [address] [facility] [address] [place] [id number] Phone: [phone] Fax: [phone]  Agent: Please be advised that RX refills may take up to 3 business days. We ask that you follow-up with your pharmacy.

## 2017-08-27 NOTE — Telephone Encounter (Signed)
Per chart, pt has appt with Clelia CroftShaw on 7/5 and Dr. Clelia CroftShaw refilled 2 meds for him.

## 2017-08-28 ENCOUNTER — Ambulatory Visit (INDEPENDENT_AMBULATORY_CARE_PROVIDER_SITE_OTHER): Payer: 59 | Admitting: Family Medicine

## 2017-08-28 VITALS — BP 108/72 | HR 72 | Temp 98.0°F | Resp 16 | Ht 67.0 in | Wt 194.0 lb

## 2017-08-28 DIAGNOSIS — R9431 Abnormal electrocardiogram [ECG] [EKG]: Secondary | ICD-10-CM | POA: Diagnosis not present

## 2017-08-28 MED ORDER — LISINOPRIL 10 MG PO TABS
5.0000 mg | ORAL_TABLET | Freq: Every day | ORAL | 0 refills | Status: DC
Start: 2017-08-28 — End: 2017-09-19

## 2017-08-28 NOTE — Progress Notes (Addendum)
Subjective:  By signing my name below, I, Dalton Kelly, attest that this documentation has been prepared under the direction and in the presence of Dalton SorensonEva Daren Doswell, MD Electronically Signed: Charline BillsEssence Kelly, ED Scribe 08/28/2017 at 5:31 PM.   Patient ID: Dalton KindsWilliam F Kelly, male    DOB: 05-05-1980, 37 y.o.   MRN: [id number]  Chief Complaint  Patient presents with  . Medication Problem    dicuss cymbalta   HPI Dalton KindsWilliam F Lumm is a 37 y.o. male who [facility] to f/u on Cymbalta withdrawal. Having hugely atypical probs coming off meds. Had presyncopal episode.  We have increased   Pain is better.   Migraines unchanged. Missed Ajovy on Tuesday but will take it today.  Klonopin a little help with anxiety during the day.  Has decreased down to 3 beads so has 3 weeks left.    Taking 1/2 tab of lisinopril since starting the propranolol 1 tab 3-4x/d but consistently 3. Sleeping better at night with the temazepam with 4-6 hrs. Never tried the Belsomra as has migraines as a side effect Is getting a nighttime routine down with relaxing practices. Is always in pain - for now "we are ok."  On certain days he is pretty much ok but some days he has a lot of pain. Is in pain contanstly. 2 d this week he has woken up in excrutiating diffuse overall pain where he couldn't hardly get out of bed. He gets worse HA if he goes above 75 mg on the trazodone which he has been on for 12-15 yrs The oxycodone is as needed but ends up being scheduled due to constant pain.    Past Medical History:  Diagnosis Date  . Anxiety   . Back pain   . Barbiturate dependence (HCC)   . Episode of syncope    Recent that sounds vasovagal  . Fatigue   . Generalized anxiety disorder   . Hair loss   . Insomnia   . Irregular bowel habits   . Migraines   . Opioid dependence in controlled environment (HCC)   . Systolic click    Past Surgical History:  Procedure Laterality Date  . WISDOM TOOTH EXTRACTION      Current Outpatient Medications on File Prior to Visit  Medication Sig Dispense Refill  . AJOVY 225 MG/1.5ML SOSY     . butalbital-acetaminophen-caffeine (FIORICET WITH CODEINE) 50-325-40-30 MG capsule Take 1-2 capsules by mouth every 6 (six) hours as needed for headache or migraine. Do not take more than 6 tabs/d. 120 capsule 2  . clonazePAM (KLONOPIN) 0.5 MG tablet Take 1 tab po qam and midday and 2 tabs po qhs 120 tablet 2  . cyclobenzaprine (FLEXERIL) 5 MG tablet Take 5 mg by mouth 3 (three) times daily as needed.     . DULoxetine (CYMBALTA) 20 MG capsule Take 1 capsule (20 mg total) by mouth daily. 90 capsule 0  . levothyroxine (SYNTHROID, LEVOTHROID) 50 MCG tablet Take 1 tablet (50 mcg total) by mouth daily before breakfast. 90 tablet 0  . lisinopril (PRINIVIL,ZESTRIL) 10 MG tablet Take 1 tablet (10 mg total) by mouth daily. 90 tablet 2  . Oxycodone HCl 10 MG TABS Take 1 tablet (10 mg total) by mouth 3 (three) times daily as needed (pain). 90 tablet 0  . promethazine (PHENERGAN) 25 MG tablet Take 1 tablet (25 mg total) by mouth every 8 (eight) hours as needed for nausea or vomiting (migraine headache). 20 tablet 5  .  propranolol (INDERAL) 20 MG tablet Take 1-2 tablets (20-40 mg total) by mouth 4 (four) times daily. 90 tablet 0  . temazepam (RESTORIL) 30 MG capsule Take 1 capsule (30 mg total) by mouth at bedtime as needed for sleep. 30 capsule 0  . traZODone (DESYREL) 150 MG tablet TAKE 1/2 TABLET AT BEDTIME 45 tablet 1  . Vitamin D, Ergocalciferol, (DRISDOL) 50000 units CAPS capsule TAKE 1 CAPSULE (50,000 UNITS TOTAL) BY MOUTH EVERY 7 (SEVEN) DAYS 24 capsule 0  . Suvorexant (BELSOMRA) 20 MG TABS Take 20 mg by mouth at bedtime. (Patient not taking: Reported on 08/19/2017) 30 tablet 0   No current facility-administered medications on file prior to visit.    Allergies  Allergen Reactions  . Ibuprofen     Abdominal pain  . Nsaids Nausea And Vomiting   Family History  Problem Relation  Age of Onset  . Cancer Mother   . Hypertension Father   . Stroke Maternal Grandmother   . Alzheimer's disease Maternal Grandmother   . Cancer Maternal Grandfather   . Diabetes Maternal Grandfather   . Cancer Paternal Grandmother   . Heart failure Paternal Grandfather    Social History   Socioeconomic History  . Marital status: Married    Spouse name: [name]  . Number of children: Not on file  . Years of education: Not on file  . Highest education level: Not on file  Occupational History  . Occupation: therapist    Comment: [employer]  Social Needs  . Financial resource strain: Not on file  . Food insecurity:    Worry: Not on file    Inability: Not on file  . Transportation needs:    Medical: Not on file    Non-medical: Not on file  Tobacco Use  . Smoking status: Former Games developer  . Smokeless tobacco: Never Used  Substance and Sexual Activity  . Alcohol use: No    Alcohol/week: 0.0 oz  . Drug use: No  . Sexual activity: Not on file  Lifestyle  . Physical activity:    Days per week: Not on file    Minutes per session: Not on file  . Stress: Not on file  Relationships  . Social connections:    Talks on phone: Not on file    Gets together: Not on file    Attends religious service: Not on file    Active member of club or organization: Not on file    Attends meetings of clubs or organizations: Not on file    Relationship status: Not on file  Other Topics Concern  . 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[employer]    Married [name]   Depression screen [facility] 2/9 08/19/2017 07/30/2017 07/03/2017 06/09/2017 05/11/2017  Decreased Interest 0 0 0 0 0  Down, Depressed, Hopeless 0 0 0 0 0  PHQ - 2 Score 0 0 0 0 0   Review of Systems   see hpi Objective:   Physical Exam  Constitutional: He is oriented to person, place, and time. He appears well-developed and well-nourished. No distress.  HENT:  Head: Normocephalic and atraumatic.  Eyes: Pupils  are equal, round, and reactive to light. Conjunctivae are normal. No scleral icterus.  Neck: Normal range of motion. Neck supple. No thyromegaly present.  Cardiovascular: Normal rate, regular rhythm, normal heart sounds and intact distal pulses.  Pulmonary/Chest: Effort normal and breath sounds normal. No respiratory distress.  Musculoskeletal: He exhibits  no edema.  Lymphadenopathy:    He has no cervical adenopathy.  Neurological: He is alert and oriented to person, place, and time.  Skin: Skin is warm and dry. He is not diaphoretic.  Psychiatric: He has a normal mood and affect. His behavior is normal.   BP 108/72   Pulse 72   Temp 98 F (36.7 C) (Oral)   Resp 16   Ht 5\' 7"  (1.702 m)   Wt 194 lb (88 kg)   SpO2 99%   BMI 30.38 kg/m     EKG: NSR, no acute ischemic changes noted. Significant change noted when compared to prior EKG done 08/19/2017 - QRS complexes now in correct direction in lead 1, III, aVR. Resolution of concern for RVH and pulmonary disease.   I have personally reviewed the EKG tracing and agree with the computer interpretation with the exception of the left atrial enlargement Sinus  Rhythm  -Left atrial enlargement.   BORDERLINE  Assessment & Plan:   1. Nonspecific abnormal electrocardiogram (ECG) (EKG)   I wonder if a pair of leads were flipped during application at visit last week where EKG showed RVH but CXR nml and pt w/o risk factors. Fortunately, EKG nml today - prioir abnml resolved. May want to repeat in sev mos if tachycardia cont/recurs and consider cardiology c/s vs holter. Perhaps chronic tachycadia has been due to some hypotension? Pt has assumed its been due to cymbalta w/d. Decrease lisinopril from 10 to 5 (just cut tab in half)  Orders Placed This Encounter  Procedures  . EKG 12-Lead    Meds ordered this encounter  Medications  . lisinopril (PRINIVIL,ZESTRIL) 10 MG tablet    Sig: Take 0.5 tablets (5 mg total) by mouth daily.    Dispense:   90 tablet    Refill:  0    Please add on as refills to current rx/.    Dalton Sorenson, MD, MPH Primary Care at [facility] [address] [place], [place]  [address] [phone] Office phone  [phone] Office fax   11/28/17 5:31 AM

## 2017-09-13 ENCOUNTER — Other Ambulatory Visit: Payer: Self-pay | Admitting: Family Medicine

## 2017-09-19 ENCOUNTER — Other Ambulatory Visit: Payer: Self-pay

## 2017-09-19 ENCOUNTER — Encounter: Payer: Self-pay | Admitting: Family Medicine

## 2017-09-19 ENCOUNTER — Ambulatory Visit (INDEPENDENT_AMBULATORY_CARE_PROVIDER_SITE_OTHER): Payer: 59 | Admitting: Family Medicine

## 2017-09-19 VITALS — BP 95/66 | HR 68 | Temp 98.4°F | Resp 16 | Ht 68.0 in | Wt 193.6 lb

## 2017-09-19 DIAGNOSIS — G8929 Other chronic pain: Secondary | ICD-10-CM

## 2017-09-19 DIAGNOSIS — F19982 Other psychoactive substance use, unspecified with psychoactive substance-induced sleep disorder: Secondary | ICD-10-CM | POA: Diagnosis not present

## 2017-09-19 DIAGNOSIS — E039 Hypothyroidism, unspecified: Secondary | ICD-10-CM

## 2017-09-19 DIAGNOSIS — G43109 Migraine with aura, not intractable, without status migrainosus: Secondary | ICD-10-CM

## 2017-09-19 DIAGNOSIS — E038 Other specified hypothyroidism: Secondary | ICD-10-CM

## 2017-09-19 DIAGNOSIS — F19939 Other psychoactive substance use, unspecified with withdrawal, unspecified: Secondary | ICD-10-CM

## 2017-09-19 DIAGNOSIS — F411 Generalized anxiety disorder: Secondary | ICD-10-CM

## 2017-09-19 MED ORDER — TEMAZEPAM 7.5 MG PO CAPS: ORAL_CAPSULE | ORAL | 0 refills | Status: DC

## 2017-09-19 MED ORDER — OXYCODONE HCL 15 MG PO TABA: 15.0000 mg | ORAL_TABLET | ORAL | 0 refills | Status: DC | PRN

## 2017-09-19 NOTE — Progress Notes (Signed)
Subjective:    Patient ID: Dalton Kelly, male    DOB: 1980-10-15, 37 y.o.   MRN: [id number] Chief Complaint  Patient presents with  . Follow-up    on medication tritraiting off of Cymbalta    HPI  He has now been off of the cymbalta for 8d but w/d sxs are getting worse, not sleeping, severely dehydrated again even though she is checking pedialyte, gatoraide, water.  Mouth is dry and increased joint pain which are his sxs of dehydration.  [name] was in a lot of pain - couldn't move without being in excrutiating pain - he couldn't get out of bed at 10:15 pm when his parents actually made him.    Was crying last night and saying he wishes it was over. Has never been on any other antidepressants.   BP has been running low - taking 1/3 tab of the lisinopril but thinks the propranolol is lowering BP to much.  He says he is not sleeping but that his pain is keeping him from sleep.  Felt like he couldn't breath well when he was laying in bed.   He is taking 2 oxycodone 10mg  at a time to get pain to decrease from a likert scale of a 10-11 to a 7 and this degree of relief only lasts for several hours.  Past Medical History:  Diagnosis Date  . Anxiety   . Back pain   . Barbiturate dependence (HCC)   . Episode of syncope    Recent that sounds vasovagal  . Fatigue   . Generalized anxiety disorder   . Hair loss   . Insomnia   . Irregular bowel habits   . Migraines   . Opioid dependence in controlled environment (HCC)   . Systolic click    Past Surgical History:  Procedure Laterality Date  . WISDOM TOOTH EXTRACTION     Current Outpatient Medications on File Prior to Visit  Medication Sig Dispense Refill  . AJOVY 225 MG/1.5ML SOSY     . cyclobenzaprine (FLEXERIL) 5 MG tablet Take 5 mg by mouth 3 (three) times daily as needed.     . traZODone (DESYREL) 150 MG tablet TAKE 1/2 TABLET AT BEDTIME 45 tablet 1   No current facility-administered medications on file prior to visit.     Allergies  Allergen Reactions  . Ibuprofen     Abdominal pain  . Nsaids Nausea And Vomiting   Family History  Problem Relation Age of Onset  . Cancer Mother   . Hypertension Father   . Stroke Maternal Grandmother   . Alzheimer's disease Maternal Grandmother   . Cancer Maternal Grandfather   . Diabetes Maternal Grandfather   . Cancer Paternal Grandmother   . Heart failure Paternal Grandfather    Social History   Socioeconomic History  . Marital status: Married    Spouse name: Dalton Kelly  . Number of children: Not on file  . Years of education: Not on file  . Highest education level: Not on file  Occupational History  . Occupation: therapist    Comment: [employer]  Social Needs  . Financial resource strain: Not on file  . Food insecurity:    Worry: Not on file    Inability: Not on file  . Transportation needs:    Medical: Not on file    Non-medical: Not on file  Tobacco Use  . Smoking status: Former Games developer  . Smokeless tobacco: Never Used  Substance and Sexual Activity  .  Alcohol use: No    Alcohol/week: 0.0 standard drinks  . Drug use: No  . Sexual activity: Not on file  Lifestyle  . Physical activity:    Days per week: Not on file    Minutes per session: Not on file  . Stress: Not on file  Relationships  . Social connections:    Talks on phone: Not on file    Gets together: Not on file    Attends religious service: Not on file    Active member of club or organization: Not on file    Attends meetings of clubs or organizations: Not on file    Relationship status: Not on file  Other Topics Concern  . 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[employer]    Married Dalton Kelly   Depression screen [facility] 2/9 10/21/2017 09/30/2017 09/19/2017 08/19/2017 07/30/2017  Decreased Interest 1 0 0 0 0  Down, Depressed, Hopeless 1 0 0 0 0  PHQ - 2 Score 2 0 0 0 0  Altered sleeping 0 - - - -  Tired, decreased energy 0 - - - -  Change in appetite 0 - -  - -  Feeling bad or failure about yourself  0 - - - -  Trouble concentrating 0 - - - -  Moving slowly or fidgety/restless 0 - - - -  Suicidal thoughts 0 - - - -  PHQ-9 Score 2 - - - -     Review of Systems  Constitutional: Positive for activity change, appetite change, chills, diaphoresis, fatigue and unexpected weight change. Negative for fever.  Cardiovascular: Negative for leg swelling.  Musculoskeletal: Positive for arthralgias, back pain, myalgias, neck pain and neck stiffness. Negative for gait problem and joint swelling.  Allergic/Immunologic: Negative for immunocompromised state.  Neurological: Positive for dizziness, weakness, light-headedness and headaches. Negative for seizures and syncope.  Psychiatric/Behavioral: Positive for agitation, behavioral problems, decreased concentration, dysphoric mood and sleep disturbance. The patient is nervous/anxious.        Objective:   Physical Exam  Constitutional: He is oriented to person, place, and time. He appears well-developed and well-nourished. No distress.  HENT:  Head: Normocephalic and atraumatic.  Eyes: Pupils are equal, round, and reactive to light. Conjunctivae are normal. No scleral icterus.  Neck: Normal range of motion. Neck supple. No thyromegaly present.  Cardiovascular: Normal rate, regular rhythm, normal heart sounds and intact distal pulses.  Pulmonary/Chest: Effort normal and breath sounds normal. No respiratory distress.  Musculoskeletal: He exhibits no edema.  Lymphadenopathy:    He has no cervical adenopathy.  Neurological: He is alert and oriented to person, place, and time.  Skin: Skin is warm and dry. He is not diaphoretic.  Psychiatric: He has a normal mood and affect. His behavior is normal.  BP 95/66 (BP Location: Right Arm, Patient Position: Sitting, Cuff Size: Normal)   Pulse 68   Temp 98.4 F (36.9 C) (Oral)   Resp 16   Ht 5\' 8"  (1.727 m)   Wt 193 lb 9.6 oz (87.8 kg)   SpO2 97%   BMI 29.44  kg/m      Assessment & Plan:    1. Other chronic pain - Will send Oxycodone which will increase from 10mg  tid (but pt was having to take 30mg  for any relief) to 15mg -30mg  prn to [employer] by 8/1  2. Migraine with aura and without status migrainosus, not intractable   3. Withdrawal from other psychoactive  substance (HCC)   4. Drug-induced insomnia (HCC)   5. Generalized anxiety disorder   6. Subclinical hypothyroidism - We last checked the thyroid panel on 6/26 - exactly 1 mo ago   Pts main complain is diffuse arthralgias and myalgias - feels that is reponsible for all other sxs - clearly not slepeing any better since starting the restoril so will do a quick taper off this week (ok to stay on baseline klonopin) and go ahead and be more aggressive w/ treating pt's pain. - will focus there - it hink if we can get him to sleep regularly and start being active during the day by controlling pain, that will help immensely in getting life back to what he was used to which in turn I think will help somatic sxs. Pt aware that he will have to be weaned off of opioids to his prior baseline before he can return to work and very agreeable - short term increase for somatic w/d pain only.  Meds ordered this encounter  Medications  . oxyCODONE HCl 15 MG TABA    Sig: Take 15-30 mg by mouth every 4 (four) hours as needed.    Dispense:  90 tablet    Refill:  0    For chronic pain  . temazepam (RESTORIL) 7.5 MG capsule    Sig: Take 2 capsule po qhs x 3d, then 1 cap po qhs x 4d, then stop    Dispense:  10 capsule    Refill:  0    Norberto SorensonEva Gerald Honea, MD, MPH Primary Care at [facility]  [facility] [address] [place], [place]  [id number] [phone] Office phone  [phone] Office fax   11/28/17 5:42 AM

## 2017-09-19 NOTE — Patient Instructions (Signed)
     IF you received an x-ray today, you will receive an invoice from [facility]. Please contact [facility] at [phone] with questions or concerns regarding your invoice.   IF you received labwork today, you will receive an invoice from [facility]. Please contact [facility] at [phone] with questions or concerns regarding your invoice.   Our billing staff will not be able to assist you with questions regarding bills from these companies.  You will be contacted with the lab results as soon as they are available. The fastest way to get your results is to activate your My Chart account. Instructions are located on the last page of this paperwork. If you have not heard from us regarding the results in 2 weeks, please contact this office.     

## 2017-09-28 ENCOUNTER — Other Ambulatory Visit: Payer: Self-pay | Admitting: Family Medicine

## 2017-09-28 NOTE — Telephone Encounter (Signed)
Synthroid 50 mcg refill request  Has appt with Dr. Clelia CroftShaw on 09/30/17  4:20.   Refill during OV in case of changes?  [facility] [address] [place] [place]

## 2017-09-30 ENCOUNTER — Other Ambulatory Visit: Payer: Self-pay

## 2017-09-30 ENCOUNTER — Encounter: Payer: Self-pay | Admitting: Family Medicine

## 2017-09-30 ENCOUNTER — Ambulatory Visit (INDEPENDENT_AMBULATORY_CARE_PROVIDER_SITE_OTHER): Payer: 59 | Admitting: Family Medicine

## 2017-09-30 VITALS — BP 114/72 | HR 99 | Temp 98.0°F | Resp 16 | Ht 68.7 in | Wt 191.0 lb

## 2017-09-30 DIAGNOSIS — F411 Generalized anxiety disorder: Secondary | ICD-10-CM | POA: Diagnosis not present

## 2017-09-30 DIAGNOSIS — G43109 Migraine with aura, not intractable, without status migrainosus: Secondary | ICD-10-CM

## 2017-09-30 DIAGNOSIS — E039 Hypothyroidism, unspecified: Secondary | ICD-10-CM

## 2017-09-30 DIAGNOSIS — E559 Vitamin D deficiency, unspecified: Secondary | ICD-10-CM | POA: Diagnosis not present

## 2017-09-30 DIAGNOSIS — F19982 Other psychoactive substance use, unspecified with psychoactive substance-induced sleep disorder: Secondary | ICD-10-CM

## 2017-09-30 DIAGNOSIS — G894 Chronic pain syndrome: Secondary | ICD-10-CM

## 2017-09-30 DIAGNOSIS — E038 Other specified hypothyroidism: Secondary | ICD-10-CM

## 2017-09-30 MED ORDER — PROPRANOLOL HCL 20 MG PO TABS: ORAL_TABLET | ORAL | 2 refills | Status: DC

## 2017-09-30 MED ORDER — LEVOTHYROXINE SODIUM 50 MCG PO TABS: 50.0000 ug | ORAL_TABLET | Freq: Every day | ORAL | 0 refills | Status: DC

## 2017-09-30 MED ORDER — PROMETHAZINE HCL 25 MG PO TABS: 25.0000 mg | ORAL_TABLET | Freq: Three times a day (TID) | ORAL | 5 refills | Status: DC | PRN

## 2017-09-30 MED ORDER — OXYCODONE HCL 15 MG PO TABS: ORAL_TABLET | ORAL | 0 refills | Status: DC

## 2017-09-30 MED ORDER — CLONAZEPAM 0.5 MG PO TABS: ORAL_TABLET | ORAL | 2 refills | Status: DC

## 2017-09-30 MED ORDER — BUTALBITAL-APAP-CAFF-COD 50-325-40-30 MG PO CAPS: 1.0000 | ORAL_CAPSULE | Freq: Four times a day (QID) | ORAL | 2 refills | Status: DC | PRN

## 2017-09-30 NOTE — Patient Instructions (Signed)
     IF you received an x-ray today, you will receive an invoice from [facility]. Please contact [facility] at [phone] with questions or concerns regarding your invoice.   IF you received labwork today, you will receive an invoice from [facility]. Please contact [facility] at [phone] with questions or concerns regarding your invoice.   Our billing staff will not be able to assist you with questions regarding bills from these companies.  You will be contacted with the lab results as soon as they are available. The fastest way to get your results is to activate your My Chart account. Instructions are located on the last page of this paperwork. If you have not heard from us regarding the results in 2 weeks, please contact this office.     

## 2017-09-30 NOTE — Progress Notes (Signed)
Subjective:    Patient ID: Dalton Kelly, male    DOB: [birth year], 37 y.o.   MRN: [id number] Chief Complaint  Patient presents with  . Chronic Conditions    follow-up     HPI  Sleep is still an issue - par for the course. His mother drove him down here. Talked w/ Edson Snowball, his boss/partner, who doesn't want him to come home back until he is ready.  He feels he is about "50/50" Been "not great" since Novebmer a year ago though started sine he started with med changes inc starting cymbalta Is getting up and moving  Req all meds be sent to pharm to [facility]  Things are tiny bit better. He needs to lie down or sit for while after going to the grocery or running an errand - finds things to be physically/mentally exhausting.  Doesn't sleep as well before he goes back to work. A lot of it is physical with pain is all over - joints, muscles most places - constant, not worse with touching.  HAs and nausea are constant - has been off for 2.5 wks.   Father had a brain tumer 5 yrs ago -so Will took off on FMLA to help care for him and when Will went back to work he did ok.  Heart racing several times/d - propranolol helps.  Tried lavender extract for mood sxs before - caused a lot of odd belching so not worth it  Past Medical History:  Diagnosis Date  . Anxiety   . Back pain   . Barbiturate dependence (HCC)   . Episode of syncope    Recent that sounds vasovagal  . Fatigue   . Generalized anxiety disorder   . Hair loss   . Insomnia   . Irregular bowel habits   . Migraines   . Opioid dependence in controlled environment (HCC)   . Systolic click    Past Surgical History:  Procedure Laterality Date  . WISDOM TOOTH EXTRACTION     Current Outpatient Medications on File Prior to Visit  Medication Sig Dispense Refill  . AJOVY 225 MG/1.5ML SOSY     . cyclobenzaprine (FLEXERIL) 5 MG tablet Take 5 mg by mouth 3 (three) times daily as needed.     . traZODone (DESYREL) 150 MG  tablet TAKE 1/2 TABLET AT BEDTIME 45 tablet 1   No current facility-administered medications on file prior to visit.    Allergies  Allergen Reactions  . Ibuprofen     Abdominal pain  . Nsaids Nausea And Vomiting   Family History  Problem Relation Age of Onset  . Cancer Mother   . Hypertension Father   . Stroke Maternal Grandmother   . Alzheimer's disease Maternal Grandmother   . Cancer Maternal Grandfather   . Diabetes Maternal Grandfather   . Cancer Paternal Grandmother   . Heart failure Paternal Grandfather    Social History   Socioeconomic History  . Marital status: Married    Spouse name: [name]  . Number of children: Not on file  . Years of education: Not on file  . Highest education level: Not on file  Occupational History  . Occupation: therapist    Comment: [employer]  Social Needs  . Financial resource strain: Not on file  . Food insecurity:    Worry: Not on file    Inability: Not on file  . Transportation needs:    Medical: Not on file    Non-medical: Not on  file  Tobacco Use  . Smoking status: Former Games developermoker  . Smokeless tobacco: Never Used  Substance and Sexual Activity  . Alcohol use: No    Alcohol/week: 0.0 standard drinks  . Drug use: No  . Sexual activity: Not on file  Lifestyle  . Physical activity:    Days per week: Not on file    Minutes per session: Not on file  . Stress: Not on file  Relationships  . Social connections:    Talks on phone: Not on file    Gets together: Not on file    Attends religious service: Not on file    Active member of club or organization: Not on file    Attends meetings of clubs or organizations: Not on file    Relationship status: Not on file  Other Topics Concern  . 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[employer]    Married [name]- [place]   [facility] 10/21/2017 09/30/2017 09/19/2017 08/19/2017 07/30/2017  Decreased Interest 1 0 0 0 0  Down, Depressed, Hopeless 1 0 0 0  0  PHQ - 2 Score 2 0 0 0 0  Altered sleeping 0 - - - -  Tired, decreased energy 0 - - - -  Change in appetite 0 - - - -  Feeling bad or failure about yourself  0 - - - -  Trouble concentrating 0 - - - -  Moving slowly or fidgety/restless 0 - - - -  Suicidal thoughts 0 - - - -  PHQ-9 Score 2 - - - -     Review of Systems See hpi    Objective:   Physical Exam  Constitutional: He is oriented to person, place, and time. He appears well-developed and well-nourished. No distress.  HENT:  Head: Normocephalic and atraumatic.  Eyes: Pupils are equal, round, and reactive to light. Conjunctivae are normal. No scleral icterus.  Neck: Normal range of motion. Neck supple. No thyromegaly present.  Cardiovascular: Normal rate, regular rhythm, normal heart sounds and intact distal pulses.  Pulmonary/Chest: Effort normal and breath sounds normal. No respiratory distress.  Musculoskeletal: He exhibits no edema.  Lymphadenopathy:    He has no cervical adenopathy.  Neurological: He is alert and oriented to person, place, and time.  Skin: Skin is warm and dry. He is not diaphoretic.  Psychiatric: He has a normal mood and affect. His behavior is normal.      BP 114/72   Pulse 99   Temp 98 F (36.7 C) (Oral)   Resp 16   Ht 5' 8.7" (1.745 m)   Wt 191 lb (86.6 kg)   SpO2 98%   BMI 28.45 kg/m      Assessment & Plan:   1. Subclinical hypothyroidism - pt feels like levothroxine 50mcg is helping him reg schedule and get up in a.m. So will continue - check thyroid labs to ensure ok to cont  2. Migraine with aura and without status migrainosus, not intractable - folllowed by neurology at [facility] - no improvement with ajovvy yet but hoping it will kick in - refilled chronic fioricet - also cause nausea (though i'm sure the polypharmacy doesn't help) so refilled prn promethazine  3. GAD (generalized anxiety disorder) - refilled chronic klonopin  4. Vitamin D deficiency - check level  5. Chronic  pain syndrome - starting to see some improvement but still w/ diffuse pain - knows we will need to wean  him off this level of opioids back to chronic prior hydrocodone OR tramadol from ortho.  6. Drug-induced insomnia (HCC) - chronic and currently I think pt's main challenge to recovery is getting good/regular sleep and it is also Will's main concern - failed temazepam as pt felt most trouble sleeping was due to pain - chron  Suspect nausea is from either the migrain  Orders Placed This Encounter  Procedures  . TSH+T4F+T3Free  . VITAMIN D 25 Hydroxy (Vit-D Deficiency, Fractures)  . Specimen Status    Meds ordered this encounter  Medications  . butalbital-acetaminophen-caffeine (FIORICET WITH CODEINE) 50-325-40-30 MG capsule    Sig: Take 1-2 capsules by mouth every 6 (six) hours as needed for headache or migraine. Do not take more than 6 tabs/d.    Dispense:  120 capsule    Refill:  2    May refill no sooner than every 28 days.  . clonazePAM (KLONOPIN) 0.5 MG tablet    Sig: Take 1 tab po qam and midday and 2 tabs po qhs    Dispense:  120 tablet    Refill:  2  . oxyCODONE (ROXICODONE) 15 MG immediate release tablet    Sig: TAKE 1-2TABS BY MOUTH EVERY 4 (FOUR) HOURS AS NEEDED.    Dispense:  90 tablet    Refill:  0  . levothyroxine (SYNTHROID, LEVOTHROID) 50 MCG tablet    Sig: Take 1 tablet (50 mcg total) by mouth daily before breakfast.    Dispense:  90 tablet    Refill:  0  . propranolol (INDERAL) 20 MG tablet    Sig: TAKE 1 TO 2 TABLETS (20-40 MG TOTAL) BY MOUTH 4 (FOUR) TIMES DAILY.    Dispense:  90 tablet    Refill:  2  . promethazine (PHENERGAN) 25 MG tablet    Sig: Take 1 tablet (25 mg total) by mouth every 8 (eight) hours as needed for nausea or vomiting (migraine headache).    Dispense:  20 tablet    Refill:  5    [name], MD, MPH Primary Care at [facility] [address] [place], [place]  [address] [phone] Office phone  [phone]  Office fax   11/28/17 5:40 AM

## 2017-10-01 LAB — SPECIMEN STATUS

## 2017-10-01 LAB — TSH+T4F+T3FREE
Free T4: 1.91 ng/dL — ABNORMAL HIGH (ref 0.82–1.77)
T3 FREE: 3.1 pg/mL (ref 2.0–4.4)
TSH: 0.785 u[IU]/mL (ref 0.450–4.500)

## 2017-10-01 LAB — VITAMIN D 25 HYDROXY (VIT D DEFICIENCY, FRACTURES): Vit D, 25-Hydroxy: 43.9 ng/mL (ref 30.0–100.0)

## 2017-10-09 ENCOUNTER — Ambulatory Visit: Payer: Self-pay | Admitting: *Deleted

## 2017-10-09 NOTE — Telephone Encounter (Signed)
Patient called in with c/o "fever." He says "I woke up this morning feeling horrible, unable to get OOB, sore throat, body aches, nausea, some nasal congestion, no appetite. [place] she's gone out to the store to buy me some medications to take for the flu, which it feels like I have it. My temperature was 101.4 when I woke up and my normal temperature runs around 97.4." According to protocol, home care advice. I advised the patient that if he is not feeling better to find an UC or ED in his area. He asked can he just get a prescription sent in for Tamiflu. I advised that he will need to be swabbed for the flu, so the ED or UC is where he should go, since he is out of town, he verbalized understanding.

## 2017-10-09 NOTE — Telephone Encounter (Signed)
  Reason for Disposition . [1] Fever AND [2] no signs of serious infection or localizing symptoms (all other triage questions negative)  Answer Assessment - Initial Assessment Questions 1. TEMPERATURE: "What is the most recent temperature?"  "How was it measured?"      101.4 2. ONSET: "When did the fever start?"      Woke up with it 3. SYMPTOMS: "Do you have any other symptoms besides the fever?"  (e.g., colds, headache, sore throat, earache, cough, rash, diarrhea, vomiting, abdominal pain)     Sore throat, body aches, nausea, no appetite, nasal congestion 4. CAUSE: If there are no symptoms, ask: "What do you think is causing the fever?"      N/A 5. CONTACTS: "Does anyone else in the family have an infection?"     No 6. TREATMENT: "What have you done so far to treat this fever?" (e.g., medications)     Nothing so far 7. IMMUNOCOMPROMISE: "Do you have of the following: diabetes, HIV positive, splenectomy, cancer chemotherapy, chronic steroid treatment, transplant patient, etc."     No 8. PREGNANCY: "Is there any chance you are pregnant?" "When was your last menstrual period?"     N/A 9. TRAVEL: "Have you traveled out of the country in the last month?" (e.g., travel history, exposures)     No  Protocols used: FEVER-A-AH

## 2017-10-09 NOTE — Telephone Encounter (Signed)
Attempted to contact pt regarding symptoms; left message on voicemail [phone]; unable to complete nurse triage at this time.

## 2017-10-11 ENCOUNTER — Other Ambulatory Visit: Payer: Self-pay | Admitting: Family Medicine

## 2017-10-12 NOTE — Telephone Encounter (Signed)
Vit D, Ergocalciferol refill; last Vit D level drawn on 09/30/17 WNL Last Refill:05/18/17  Last OV: 09/30/17 PCP: Dr. Clelia CroftShaw

## 2017-10-21 ENCOUNTER — Other Ambulatory Visit: Payer: Self-pay

## 2017-10-21 ENCOUNTER — Encounter: Payer: Self-pay | Admitting: Family Medicine

## 2017-10-21 ENCOUNTER — Ambulatory Visit (INDEPENDENT_AMBULATORY_CARE_PROVIDER_SITE_OTHER): Payer: 59 | Admitting: Family Medicine

## 2017-10-21 VITALS — BP 108/68 | HR 72 | Temp 98.0°F | Resp 16 | Ht 68.0 in | Wt 192.6 lb

## 2017-10-21 DIAGNOSIS — T50905S Adverse effect of unspecified drugs, medicaments and biological substances, sequela: Secondary | ICD-10-CM

## 2017-10-21 DIAGNOSIS — Z79899 Other long term (current) drug therapy: Secondary | ICD-10-CM

## 2017-10-21 DIAGNOSIS — F411 Generalized anxiety disorder: Secondary | ICD-10-CM | POA: Diagnosis not present

## 2017-10-21 DIAGNOSIS — G894 Chronic pain syndrome: Secondary | ICD-10-CM | POA: Diagnosis not present

## 2017-10-21 DIAGNOSIS — E038 Other specified hypothyroidism: Secondary | ICD-10-CM

## 2017-10-21 DIAGNOSIS — E039 Hypothyroidism, unspecified: Secondary | ICD-10-CM | POA: Diagnosis not present

## 2017-10-21 MED ORDER — OXYCODONE HCL 15 MG PO TABS: ORAL_TABLET | ORAL | 0 refills | Status: DC

## 2017-10-21 MED ORDER — LEVOTHYROXINE SODIUM 50 MCG PO TABS: 50.0000 ug | ORAL_TABLET | Freq: Every day | ORAL | 0 refills | Status: DC

## 2017-10-21 NOTE — Progress Notes (Signed)
Subjective:    Patient ID: Dalton Kelly, male    DOB: 16-Dec-1980, 37 y.o.   MRN: [id number]  HPI  Back home as of today. Sleeping better with a more regular schedule. Starts back on the 9th.  He is weaning down on the oxycodone still every 4 hours usually 1 sometyimes 2 - still HAs, myalgias, back but it is manageable - the back pain is his new normal but the additional myalgias are going away.  Dr. Christian Mate - Josh is ortho PA - just on flexeril. He was initially tramadol 100mg  at dinner and 100mg  at night 2 hrs later - when he was on the hydrocodone he was just taking 5mg  at night only.  This began ~2006 when she started on the hydrocodone. [facility] Past Medical History:  Diagnosis Date  . Anxiety   . Back pain   . Barbiturate dependence (HCC)   . Episode of syncope    Recent that sounds vasovagal  . Fatigue   . Generalized anxiety disorder   . Hair loss   . Insomnia   . Irregular bowel habits   . Migraines   . Opioid dependence in controlled environment (HCC)   . Systolic click    Past Surgical History:  Procedure Laterality Date  . WISDOM TOOTH EXTRACTION     Current Outpatient Medications on File Prior to Visit  Medication Sig Dispense Refill  . AJOVY 225 MG/1.5ML SOSY     . butalbital-acetaminophen-caffeine (FIORICET WITH CODEINE) 50-325-40-30 MG capsule Take 1-2 capsules by mouth every 6 (six) hours as needed for headache or migraine. Do not take more than 6 tabs/d. 120 capsule 2  . clonazePAM (KLONOPIN) 0.5 MG tablet Take 1 tab po qam and midday and 2 tabs po qhs 120 tablet 2  . cyclobenzaprine (FLEXERIL) 5 MG tablet Take 5 mg by mouth 3 (three) times daily as needed.     Marland Kitchen levothyroxine (SYNTHROID, LEVOTHROID) 50 MCG tablet Take 1 tablet (50 mcg total) by mouth daily before breakfast. 90 tablet 0  . oxyCODONE (ROXICODONE) 15 MG immediate release tablet TAKE 1-2TABS BY MOUTH EVERY 4 (FOUR) HOURS AS NEEDED. 90 tablet 0  . promethazine (PHENERGAN) 25 MG tablet Take 1  tablet (25 mg total) by mouth every 8 (eight) hours as needed for nausea or vomiting (migraine headache). 20 tablet 5  . propranolol (INDERAL) 20 MG tablet TAKE 1 TO 2 TABLETS (20-40 MG TOTAL) BY MOUTH 4 (FOUR) TIMES DAILY. 90 tablet 2  . traZODone (DESYREL) 150 MG tablet TAKE 1/2 TABLET AT BEDTIME 45 tablet 1  . Vitamin D, Ergocalciferol, (DRISDOL) 50000 units CAPS capsule TAKE 1 CAPSULE (50,000 UNITS TOTAL) BY MOUTH EVERY 7 (SEVEN) DAYS 12 capsule 1   No current facility-administered medications on file prior to visit.    Allergies  Allergen Reactions  . Ibuprofen     Abdominal pain  . Nsaids Nausea And Vomiting   Family History  Problem Relation Age of Onset  . Cancer Mother   . Hypertension Father   . Stroke Maternal Grandmother   . Alzheimer's disease Maternal Grandmother   . Cancer Maternal Grandfather   . Diabetes Maternal Grandfather   . Cancer Paternal Grandmother   . Heart failure Paternal Grandfather    Social History   Socioeconomic History  . Marital status: Married    Spouse name: [name]  . Number of children: Not on file  . Years of education: Not on file  . Highest education level: Not on  file  Occupational History  . Occupation: therapist    Comment: [employer]  Social Needs  . Financial resource strain: Not on file  . Food insecurity:    Worry: Not on file    Inability: Not on file  . Transportation needs:    Medical: Not on file    Non-medical: Not on file  Tobacco Use  . Smoking status: Former Games developermoker  . Smokeless tobacco: Never Used  Substance and Sexual Activity  . Alcohol use: No    Alcohol/week: 0.0 standard drinks  . Drug use: No  . Sexual activity: Not on file  Lifestyle  . Physical activity:    Days per week: Not on file    Minutes per session: Not on file  . Stress: Not on file  Relationships  . Social connections:    Talks on phone: Not on file    Gets together: Not on file    Attends religious service: Not on file    Active  member of club or organization: Not on file    Attends meetings of clubs or organizations: Not on file    Relationship status: Not on file  Other Topics Concern  . 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[employer]    Married [name]- [place]   [facility] 10/21/2017 09/30/2017 09/19/2017 08/19/2017 07/30/2017  Decreased Interest 1 0 0 0 0  Down, Depressed, Hopeless 1 0 0 0 0  PHQ - 2 Score 2 0 0 0 0  Altered sleeping 0 - - - -  Tired, decreased energy 0 - - - -  Change in appetite 0 - - - -  Feeling bad or failure about yourself  0 - - - -  Trouble concentrating 0 - - - -  Moving slowly or fidgety/restless 0 - - - -  Suicidal thoughts 0 - - - -  PHQ-9 Score 2 - - - -      Review of Systems  Constitutional: Positive for fatigue. Negative for chills and fever.  Eyes: Negative for visual disturbance.  Respiratory: Negative for shortness of breath.   Cardiovascular: Negative for chest pain and leg swelling.  Allergic/Immunologic: Negative for immunocompromised state.  Neurological: Positive for light-headedness and headaches. Negative for syncope and facial asymmetry.  Psychiatric/Behavioral: Positive for sleep disturbance.       Objective:   Physical Exam  Constitutional: He is oriented to person, place, and time. He appears well-developed and well-nourished. No distress.  HENT:  Head: Normocephalic and atraumatic.  Eyes: Pupils are equal, round, and reactive to light. Conjunctivae are normal. No scleral icterus.  Neck: Normal range of motion. Neck supple. No thyromegaly present.  Cardiovascular: Normal rate, regular rhythm, normal heart sounds and intact distal pulses.  Pulmonary/Chest: Effort normal and breath sounds normal. No respiratory distress.  Musculoskeletal: He exhibits no edema.  Lymphadenopathy:    He has no cervical adenopathy.  Neurological: He is alert and oriented to person, place, and time.  Skin: Skin is warm and dry. He is  not diaphoretic.  Psychiatric: He has a normal mood and affect. His behavior is normal.      BP 108/68   Pulse 72   Temp 98 F (36.7 C) (Oral)   Resp 16   Ht 5\' 8"  (1.727 m)   Wt 192 lb 9.6 oz (87.4 kg)   SpO2 99%   BMI 29.28 kg/m      Assessment &  Plan:   1. High risk medication use - doing so much better, ready to go back to work - letter written - however, need to wean off oxycodone first -   2. Medication side effect, sequela   3. Chronic pain syndrome   4. Subclinical hypothyroidism - ok to continue levothryxoine 50 for now - it does seem that he has boverall had worse pain and sleep since on med though so consider doing trial off in future  5. Generalized anxiety disorder - I discussed w/ pt that at some point if mood sxs ever flair, it would never hurt to get a psych c/s - [facility] or other - Dr. Evelene Croon - pt declines for now as feeling much better - on the right path.   Start tapering oxycodone (see detailed ins in AVS) Decrease to every 8 hours x 3d, then 1 tab every 12 hrs x 3d, then decrease to 1 tab qhs x 3d, then 1/2 tab po qhs. EVERYTHING HAS TO BE WRITTEN FOR 90DAY  Meds ordered this encounter  Medications  . levothyroxine (SYNTHROID, LEVOTHROID) 50 MCG tablet    Sig: Take 1 tablet (50 mcg total) by mouth daily before breakfast.    Dispense:  90 tablet    Refill:  0  . oxyCODONE (ROXICODONE) 15 MG immediate release tablet    Sig: TAKE 1 TABS BY MOUTH EVERY 6-8 HOURS AS NEEDED.    Dispense:  90 tablet    Refill:  0    For chronic pain syndrome      [name], M.D.  Primary Care at [facility] [address] [place], [place] [address] [phone] phone [phone] fax  10/21/17 4:50 PM

## 2017-10-21 NOTE — Patient Instructions (Addendum)
Decrease the oxycodone to 1 tab every 6 hrs x 3d, then 1 every 8 hrs x 3d, then 1 twice a day x 3d, then 1 at night x 3d, then 1/2 tab at night. Lets recheck in 1 mo to see how you have done on this and discuss your long term management of your chronic back pain in light of how you have done on this and prior regimens.   We will try to get you back to your every 3 mos visit at that time with the goal of getting you back to every 6!   If you have lab work done today you will be contacted with your lab results within the next 2 weeks.  If you have not heard from us then please contact us. The fastest way to get your results is to register for My Chart.   IF you received an x-ray today, you will receive an invoice from [facility]. Please contact [facility] at [phone] with questions or concerns regarding your invoice.   IF you received labwork today, you will receive an invoice from [facility]. Please contact [facility] at [phone] with questions or concerns regarding your invoice.   Our billing staff will not be able to assist you with questions regarding bills from these companies.  You will be contacted with the lab results as soon as they are available. The fastest way to get your results is to activate your My Chart account. Instructions are located on the last page of this paperwork. If you have not heard from us regarding the results in 2 weeks, please contact this office.      Opioid Withdrawal Opioids are powerful substances that relieve pain. Opioids include illegal drugs, such as heroin, as well as prescription pain medicines, such as codeine, morphine, hydrocodone, oxycodone, and fentanyl. Opioid withdrawal is a group of symptoms that can happen if you have been taking opioids for a long time and suddenly stop. What are the causes? This condition is caused by taking opioids for weeks and then doing any of the following:  Stopping use.  Rapidly reducing  use.  Taking a medicine to block their effect.  What increases the risk? This condition is more likely to develop in:  People who take opioids incorrectly.  People who take opioids for a long period of time.  What are the signs or symptoms? Symptoms of this condition can be physical or mental. Physical symptoms include:  Nausea and vomiting.  Muscle aches or spasms.  Watery eyes and runny nose.  Widening of the dark centers of the eyes (dilated pupils).  Hair standing on end.  Fever and sweating.  Intestinal cramping and diarrhea.  Increased blood pressure and fast pulse.  Mental symptoms include:  Depression.  Anxiety.  Restlessness and irritability.  Trouble sleeping.  When symptoms start and how long they last depends on if you have been taking an opioid that works fast and then loses its effect quickly (short acting-opioid), an opioid that works for a longer period of time (long-acting opioid), or a drug that blocks the effects of opioids.  If you have been taking a short-acting opioid, such as heroin and oxycodone, symptoms occur within hours of stopping or reducing the amount you take. The worst symptoms (peak withdrawal) occur in 24-48 hours. Symptoms should subside in 3-5 days.  If you have been taking a long-acting opioid, such as methadone, symptoms can occur within 30 hours of stopping or reducing the amount you take and  can continue for up to 10 days.  If you are taking a drug that blocks the effects of opioids, such as naltrexone or naloxone, symptoms begin within minutes.  How is this diagnosed? This condition is diagnosed based on:  Your symptoms.  Your medical history.  Your history of drug and alcohol use.  Which medicines you have been taking.  Your health care provider may:  Perform a physical exam.  Order tests.  Ask that you see a mental health professional.  How is this treated? Treatment for this condition is usually provided  by mental health professionals with training in substance use disorders (addiction specialists). Treatment may involve:  Counseling. This treatment is also called talk therapy. It is provided by substance use treatment counselors.  Support groups. Support groups are run by people who have quit using opioids. They provide emotional support, advice, and guidance.  Medicine. Some medicines can help to lessen certain withdrawal symptoms. Sometimes an opioid is prescribed to replace the opioid that you have been taking. You may be asked to take less and less of this opioid over time to lessen or prevent withdrawal symptoms.  Follow these instructions at home:  Take over-the-counter and prescription medicines only as told by your health care provider.  Check with your health care provider before starting any new medicines.  Keep all follow-up visits as told by your health care provider. This is important. Contact a health care provider if:  You are not able to take your medicines as told.  Your symptoms get worse.  You take an opioid after stopping use, or you take more of an opioid than you have been. Get help right away if:  You have a seizure.  You lose consciousness.  You have serious thoughts about hurting yourself or others. If you ever feel like you may hurt yourself or others, or have thoughts about taking your own life, get help right away. You can go to your nearest emergency department or call:  Your local emergency services (911 in the [place]  A suicide crisis helpline, such as the National Suicide Prevention Lifeline at (228)584-8062. This is open 24 hours a day.  This information is not intended to replace advice given to you by your health care provider. Make sure you discuss any questions you have with your health care provider. Document Released: 02/13/2003 Document Revised: 11/23/2015 Document Reviewed: 11/23/2015 Elsevier Interactive Patient Education  [employer].

## 2017-11-04 ENCOUNTER — Telehealth: Payer: Self-pay | Admitting: Family Medicine

## 2017-11-04 NOTE — Telephone Encounter (Signed)
LVM for pt to call back and schedule his appt for weaning off a controlled substance.  Since Clelia Croft will be on leave the month of September and October, he may see any provider.

## 2017-11-12 NOTE — Telephone Encounter (Signed)
Patient is scheduled for 10/8, he really wants to make sure Dr Clelia CroftShaw is aware of this and follows up with his after visit summary

## 2017-11-18 DIAGNOSIS — G43709 Chronic migraine without aura, not intractable, without status migrainosus: Secondary | ICD-10-CM | POA: Diagnosis not present

## 2017-11-28 DIAGNOSIS — G894 Chronic pain syndrome: Secondary | ICD-10-CM | POA: Insufficient documentation

## 2017-11-28 NOTE — Progress Notes (Addendum)
Subjective:   Patient was seen in Room 2 .   Patient ID: Dalton Kelly, male    DOB: 1980/08/16, 37 y.o.   MRN: [id number] Chief Complaint  Patient presents with  . Fatigue    called ambulance Monday due to exhaustion, didn't go to ER   HPI Dalton Kelly is a 37 y.o. male who presents to Primary Care at [facility] complaining of fatigue.   Thinks fatigue is due to DC visit.  Going to stay w/ parents who live in [place].  He is out of work 2 weeks ago and taking off on medical leave until at least than end of July - maybe August.  He is going to wean down on a 5-4-3-2-1 taper on the cymbalta.  Doesn't just wants to stop nor does he want to go up to stablize.  Currently on 4 tabs a day on Fioricet.  Really hopes that after this past month when he is off the past month of the cymbalta he is going to be able to come off.  The temazepam seems to work on a large basis and the oxycodone is helping control his pain.  Is seeing his therapist again who is worried about his anxiety and lack of sleep.   For the past 2 weeks, he went up to a whole tab of lisinopril but will go back a half.   Is always in pain and the pain wakes him up - he is very cautious of going up on the pain medication  Disjointed speech, slurring, thought organnization, hard to breath, high anxiety, lack of appetite, sick feeling, constant high anxiety, can't calm him down or say the right things so he doesn't know what to do.     Past Medical History:  Diagnosis Date  . Anxiety   . Back pain   . 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 Episode of syncope    Recent that sounds vasovagal  . Fatigue   . Generalized anxiety disorder   . Hair loss   . Insomnia   . Irregular bowel habits   . Migraines   . Opioid dependence in controlled environment ([place])   . Systolic click    Past Surgical History:  Procedure Laterality Date  . WISDOM TOOTH EXTRACTION     Prior to Admission medications   Medication Sig Start  Date End Date Taking? Authorizing Provider  AJOVY 225 MG/1.5ML SOSY  01/20/17   [provider]  butalbital-acetaminophen-caffeine (FIORICET WITH CODEINE) 50-325-40-30 MG capsule Take 1-2 capsules by mouth every 6 (six) hours as needed for headache or migraine. Do not take more than 6 tabs/d. 07/15/17   Shawnee Knapp, MD  clonazePAM Bobbye [place]) 0.5 MG tablet Take 1 tab po qam and midday and 2 tabs po qhs 07/21/17   Shawnee Knapp, MD  cyclobenzaprine (FLEXERIL) 5 MG tablet Take 5 mg by mouth 3 (three) times daily as needed.  10/25/15   Earlie Server, MD  DULoxetine (CYMBALTA) 20 MG capsule Take 1 capsule (20 mg total) by mouth daily. 07/03/17   Shawnee Knapp, MD  Eszopiclone 3 MG TABS Take 1 tablet (3 mg total) by mouth at bedtime. Take immediately before bedtime 07/22/17   Shawnee Knapp, MD  levothyroxine (SYNTHROID, LEVOTHROID) 50 MCG tablet Take 1 tablet (50 mcg total) by mouth daily before breakfast. 07/03/17   Shawnee Knapp, MD  lisinopril (PRINIVIL,ZESTRIL) 10 MG tablet Take 1 tablet (10 mg total) by mouth daily. 04/23/17  Shawnee Knapp, MD  Oxycodone HCl 10 MG TABS Take 1 tablet (10 mg total) by mouth 2 (two) times daily as needed. 07/30/17   Shawnee Knapp, MD  promethazine (PHENERGAN) 25 MG tablet Take 1 tablet (25 mg total) by mouth every 8 (eight) hours as needed for nausea or vomiting (migraine headache). 05/11/17   Shawnee Knapp, MD  Suvorexant (BELSOMRA) 20 MG TABS Take 20 mg by mouth at bedtime. 07/30/17   Shawnee Knapp, MD  temazepam (RESTORIL) 30 MG capsule Take 1 capsule (30 mg total) by mouth at bedtime as needed for sleep. 07/30/17   Shawnee Knapp, MD  traZODone (DESYREL) 150 MG tablet TAKE 1/2 TABLET AT BEDTIME 11/28/16   Shawnee Knapp, MD  Vitamin D, Ergocalciferol, (DRISDOL) 50000 units CAPS capsule TAKE 1 CAPSULE (50,000 UNITS TOTAL) BY MOUTH EVERY 7 (SEVEN) DAYS 05/18/17   Shawnee Knapp, MD   Allergies  Allergen Reactions  . Ibuprofen     Abdominal pain  . Nsaids Nausea And Vomiting   Family History    Problem Relation Age of Onset  . Cancer Mother   . Hypertension Father   . Stroke Maternal Grandmother   . Alzheimer's disease Maternal Grandmother   . Cancer Maternal Grandfather   . Diabetes Maternal Grandfather   . Cancer Paternal Grandmother   . Heart failure Paternal Grandfather    Social History   Socioeconomic History  . Marital status: Married    Spouse name: Shanon Brow  . Number of children: Not on file  . Years of education: Not on file  . Highest education level: Not on file  Occupational History  . Occupation: therapist    Comment: [employer]  Social Needs  . Financial resource strain: Not on file  . Food insecurity:    Worry: Not on file    Inability: Not on file  . Transportation needs:    Medical: Not on file    Non-medical: Not on file  Tobacco Use  . Smoking status: Former Research scientist (life sciences)  . Smokeless tobacco: Never Used  Substance and Sexual Activity  . Alcohol use: No    Alcohol/week: 0.0 standard drinks  . Drug use: No  . Sexual activity: Not on file  Lifestyle  . Physical activity:    Days per week: Not on file    Minutes per session: Not on file  . Stress: Not on file  Relationships  . Social connections:    Talks on phone: Not on file    Gets together: Not on file    Attends religious service: Not on file    Active member of club or organization: Not on file    Attends meetings of clubs or organizations: Not on file    Relationship status: Not on file  Other Topics Concern  . 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[employer]    Married Shanon Brow   Depression screen [facility] 2/9 10/21/2017 09/30/2017 09/19/2017 08/19/2017 07/30/2017  Decreased Interest 1 0 0 0 0  Down, Depressed, Hopeless 1 0 0 0 0  PHQ - 2 Score 2 0 0 0 0  Altered sleeping 0 - - - -  Tired, decreased energy 0 - - - -  Change in appetite 0 - - - -  Feeling bad or failure about yourself  0 - - - -  Trouble concentrating 0 - - - -  Moving slowly or  fidgety/restless 0 - - - -  Suicidal thoughts 0 - - - -  PHQ-9 Score 2 - - - -    Review of Systems  Constitutional: Positive for fatigue.  Neurological: Negative for syncope.       Objective:   Physical Exam  Constitutional: He is oriented to person, place, and time. He appears well-developed and well-nourished. No distress.  HENT:  Head: Normocephalic and atraumatic.  Eyes: Pupils are equal, round, and reactive to light. EOM are normal.  Neck: Neck supple.  Cardiovascular: Tachycardic. Normal rhythm and heart sounds.  Pulmonary/Chest: Effort normal. No respiratory distress.  Musculoskeletal: Normal range of motion.  Neurological: He is alert and oriented to person, place, and time.  Skin: Skin is warm and dry.  Psychiatric: He has a normal mood and affect. His behavior is depressed, withdrawn, flattened affect.  Nursing note and vitals reviewed.   BP 112/76   Pulse (!) 105   Temp 97.8 F (36.6 C)   Resp 16   Ht 5' 7" (1.702 m)   Wt 191 lb 3.2 oz (86.7 kg)   SpO2 100%   PF 580 L/min   BMI 29.95 kg/m   [3:57 PM] Orthostatics negative, HR and BP increased with standing; patient has been chronically tachycardic for the past year; no prior EKG on chart.   EKG: NSR, no acute ischemic changes noted. Notable R-wave in v1, followed by no R-wave progression. No prior EKG for comparison.  I have personally reviewed the EKG tracing and agree with the computer interpretation.  Sinus  Rhythm  -Prominent R(V1) and right axis -consider right ventricular hypertrophy  -consider pulmonary disease.   -  Nonspecific T-abnormality.  ABNORMAL   Results for orders placed or performed in visit on 08/19/17  TSH+T4F+T3Free  Result Value Ref Range   TSH 0.666 0.450 - 4.500 uIU/mL   T3, Free 3.4 2.0 - 4.4 pg/mL   Free T4 1.18 0.82 - 1.77 ng/dL  CBC with Differential/Platelet  Result Value Ref Range   WBC 8.6 3.4 - 10.8 x10E3/uL   RBC 4.79 4.14 - 5.80 x10E6/uL   Hemoglobin 15.1 13.0 -  17.7 g/dL   Hematocrit 42.5 37.5 - 51.0 %   MCV 89 79 - 97 fL   MCH 31.5 26.6 - 33.0 pg   MCHC 35.5 31.5 - 35.7 g/dL   RDW 13.4 12.3 - 15.4 %   Platelets 264 150 - 450 x10E3/uL   Neutrophils 68 Not Estab. %   Lymphs 21 Not Estab. %   Monocytes 7 Not Estab. %   Eos 4 Not Estab. %   Basos 0 Not Estab. %   Neutrophils Absolute 5.8 1.4 - 7.0 x10E3/uL   Lymphocytes Absolute 1.8 0.7 - 3.1 x10E3/uL   Monocytes Absolute 0.6 0.1 - 0.9 x10E3/uL   EOS (ABSOLUTE) 0.3 0.0 - 0.4 x10E3/uL   Basophils Absolute 0.0 0.0 - 0.2 x10E3/uL   Immature Granulocytes 0 Not Estab. %   Immature Grans (Abs) 0.0 0.0 - 0.1 x10E3/uL  Comprehensive metabolic panel  Result Value Ref Range   Glucose 111 (H) 65 - 99 mg/dL   BUN 9 6 - 20 mg/dL   Creatinine, Ser 0.90 0.76 - 1.27 mg/dL   GFR calc non Af Amer 109 >59 mL/min/1.73   GFR calc Af Amer 126 >59 mL/min/1.73   BUN/Creatinine Ratio 10 9 - 20   Sodium 140 134 - 144 mmol/L   Potassium 4.5 3.5 - 5.2 mmol/L   Chloride 102 96 - 106 mmol/L   CO2 24 20 -  29 mmol/L   Calcium 9.4 8.7 - 10.2 mg/dL   Total Protein 7.0 6.0 - 8.5 g/dL   Albumin 4.5 3.5 - 5.5 g/dL   Globulin, Total 2.5 1.5 - 4.5 g/dL   Albumin/Globulin Ratio 1.8 1.2 - 2.2   Bilirubin Total <0.2 0.0 - 1.2 mg/dL   Alkaline Phosphatase 102 39 - 117 IU/L   AST 21 0 - 40 IU/L   ALT 22 0 - 44 IU/L  Sedimentation Rate  Result Value Ref Range   Sed Rate 2 0 - 15 mm/hr  C-reactive protein  Result Value Ref Range   CRP 9 0 - 10 mg/L  Lipase  Result Value Ref Range   Lipase 23 13 - 78 U/L  CK  Result Value Ref Range   Total CK 75 24 - 204 U/L  POCT urinalysis dipstick  Result Value Ref Range   Color, UA yellow yellow   Clarity, UA clear clear   Glucose, UA negative negative mg/dL   Bilirubin, UA negative negative   Ketones, POC UA negative negative mg/dL   Spec Grav, UA 1.010 1.010 - 1.025   Blood, UA negative negative   pH, UA 7.0 5.0 - 8.0   Protein Ur, POC negative negative mg/dL    Urobilinogen, UA 0.2 0.2 or 1.0 E.U./dL   Nitrite, UA Negative Negative   Leukocytes, UA Negative Negative   Peak flow 580 Predicted peak flow: 631  Dg Chest 2 View  Result Date: 08/19/2017 CLINICAL DATA:  Near syncope and fatigue. EXAM: CHEST - 2 VIEW COMPARISON:  None. FINDINGS: The heart size and mediastinal contours are within normal limits. Both lungs are clear. The visualized skeletal structures are unremarkable. IMPRESSION: No active cardiopulmonary disease. Electronically Signed   By: Abelardo Diesel M.D.   On: 08/19/2017 18:34      Assessment & Plan:   1. Near syncope   2. Dehydration - 1 L NS IVF administered afterwhich pt noted that he felt much improved and did have better color  3. Nonspecific abnormal electrocardiogram (ECG) (EKG) - non-specific but I would like pt to RTC in 1 wk to recheck EKG due to report of RVH but was not seen on CXR - if persists will want to get echo, cardiac eval, sleep study  4. Myalgia   5. Chronic pain syndrome   6. 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 Chronic fatigue   8. Medication side effect, sequela   9. Polypharmacy     Over 60 min spent in face-to-face evaluation of and consultation with patient and coordination of care.  Over 50% of this time was spent counseling this patient regarding future plan of care for continued wean of cymbalta - first spoke w/ pt alone and then brought his parents and husband Shanon Brow into the room to review plan and answer questions. Will is going to be moving back in to stay with his parents for caretaking reasons.  We need to focus on sleep and pain ocntrol at this point to let him get off the cymbalta - after he is off then we can start weaning of the narcotic pain medicine. Regularity of schedule is very important that he try to be awake and as active as he can during day and only lay in bed at night. Since we are using bzd and opioids simultaneously, reviewed high risk of accidental od - esp if off med for sev wks for  some reason and restarted same prior dose- always ok to call 911  if concerned but if he is unresponsive, use nasal narcan - reviewed how to use.   Cont cymbalta wean by 1 bead/wk - will be off in a month. Increase oxycodone to tid  Orders Placed This Encounter  Procedures  . DG Chest 2 View    Standing Status:   Future    Number of Occurrences:   1    Standing Expiration Date:   08/19/2018    Order Specific Question:   Reason for Exam (SYMPTOM  OR DIAGNOSIS REQUIRED)    Answer:   near syncope, fatigue, abnml EKG wiht RVH +/- pulmonary disease    Order Specific Question:   Preferred imaging location?    Answer:   External  . TSH+T4F+T3Free  . CBC with Differential/Platelet  . Comprehensive metabolic panel  . Sedimentation Rate  . C-reactive protein  . Lipase  . CK  . CK  . Orthostatic vital signs  . Care order/instruction:    Scheduling Instructions:     Peak Flow (IF NEB IS ORDERED PLEASE DO BEFORE AND AFTER NEB)  . POCT urinalysis dipstick  . EKG 12-Lead  . Insert peripheral IV    Meds ordered this encounter  Medications  . propranolol (INDERAL) 20 MG tablet    Sig: Take 1-2 tablets (20-40 mg total) by mouth 4 (four) times daily.    Dispense:  90 tablet    Refill:  0  . naloxone (NARCAN) nasal spray 4 mg/0.1 mL    Sig: Place 1 spray into the nose once for 1 dose.    Dispense:  2 kit    Refill:  0  . Oxycodone HCl 10 MG TABS    Sig: Take 1 tablet (10 mg total) by mouth 3 (three) times daily as needed (pain).    Dispense:  90 tablet    Refill:  0    For chronic pain  . temazepam (RESTORIL) 30 MG capsule    Sig: Take 1 capsule (30 mg total) by mouth at bedtime as needed for sleep.    Dispense:  30 capsule    Refill:  0    Delman Cheadle, M.D.  Primary Care at [facility] [address] [place], [place] [address] [phone] phone [phone] fax  11/28/17 5:12 AM

## 2017-12-01 ENCOUNTER — Ambulatory Visit: Payer: 59 | Admitting: Family Medicine

## 2017-12-04 ENCOUNTER — Other Ambulatory Visit: Payer: Self-pay | Admitting: Family Medicine

## 2017-12-04 NOTE — Telephone Encounter (Signed)
Requested Prescriptions  Pending Prescriptions Disp Refills  . propranolol (INDERAL) 20 MG tablet [Pharmacy Med Name: PROPRANOLOL 20 MG TABLET] 270 tablet 0    Sig: TAKE 1 TO 2 TABLETS BY MOUTH 4 TIMES DAILY.     Cardiovascular:  Beta Blockers Passed - 12/04/2017 12:48 PM      Passed - Last BP in normal range    BP Readings from Last 1 Encounters:  10/21/17 108/68         Passed - Last Heart Rate in normal range    Pulse Readings from Last 1 Encounters:  10/21/17 72         Passed - Valid encounter within last 6 months    Recent Outpatient Visits          1 month ago High risk medication use   [facility], Levell July, MD   2 months ago Subclinical hypothyroidism   [facility] at [facility], Levell July, MD   2 months ago Other chronic pain   [facility] at [facility], Levell July, MD   3 months ago Nonspecific abnormal electrocardiogram (ECG) (EKG)   [facility] at [facility], Levell July, MD   3 months ago Near syncope   [facility] at [facility], Levell July, MD      Future Appointments            In 3 weeks Myles Lipps, MD Primary Care at [place], [facility]   In 1 month Sherren Mocha, MD Primary Care at [place], [facility]

## 2017-12-11 ENCOUNTER — Telehealth: Payer: Self-pay | Admitting: Family Medicine

## 2017-12-11 NOTE — Telephone Encounter (Signed)
Left a VM in regards to his appt he has with Dr. Clelia Croft on 01/19/2018 at 4:00. The providers last appt is at 3:30 and needs to be rescheduled.

## 2017-12-14 ENCOUNTER — Other Ambulatory Visit: Payer: Self-pay | Admitting: Family Medicine

## 2017-12-14 DIAGNOSIS — G4700 Insomnia, unspecified: Secondary | ICD-10-CM

## 2017-12-20 ENCOUNTER — Encounter: Payer: Self-pay | Admitting: Family Medicine

## 2017-12-21 ENCOUNTER — Other Ambulatory Visit: Payer: Self-pay | Admitting: Family Medicine

## 2017-12-21 MED ORDER — PROMETHAZINE HCL 25 MG PO TABS: 25.0000 mg | ORAL_TABLET | Freq: Three times a day (TID) | ORAL | 5 refills | Status: DC | PRN

## 2017-12-22 NOTE — Telephone Encounter (Signed)
Requested medication (s) are due for refill today: yes  Requested medication (s) are on the active medication list: yes    Last refill: 09/30/17  #20  5 refills  Future visit scheduled Yes  12/25/17  Notes to clinic:Not delegated  Requested Prescriptions  Pending Prescriptions Disp Refills   promethazine (PHENERGAN) 25 MG tablet [Pharmacy Med Name: PROMETHAZINE 25 MG TABLET] 20 tablet 5    Sig: TAKE 1 TABLET BY MOUTH EVERY 8 HOURS AS NEEDED FOR NAUSEA OR VOMITING (MIGRAINE HEADACHE).     Not Delegated - Gastroenterology: Antiemetics Failed - 12/21/2017  1:40 PM      Failed - This refill cannot be delegated      Passed - Valid encounter within last 6 months    Recent Outpatient Visits          2 months ago High risk medication use   [facility] at [facility], Levell July, MD   2 months ago Subclinical hypothyroidism   [facility] at [facility], Levell July, MD   3 months ago Other chronic pain   [facility] at [facility], Levell July, MD   3 months ago Nonspecific abnormal electrocardiogram (ECG) (EKG)   [facility] at [facility], Levell July, MD   4 months ago Near syncope   [facility] at [facility], Levell July, MD      Future Appointments            In 3 days Myles Lipps, MD Primary Care at [place], [facility]   In 4 weeks Sherren Mocha, MD Primary Care at [place], [facility]

## 2017-12-25 ENCOUNTER — Ambulatory Visit: Payer: 59 | Admitting: Family Medicine

## 2017-12-28 ENCOUNTER — Encounter: Payer: Self-pay | Admitting: Family Medicine

## 2017-12-28 DIAGNOSIS — F411 Generalized anxiety disorder: Secondary | ICD-10-CM

## 2017-12-28 DIAGNOSIS — G43109 Migraine with aura, not intractable, without status migrainosus: Secondary | ICD-10-CM

## 2017-12-28 MED ORDER — BUTALBITAL-APAP-CAFF-COD 50-325-40-30 MG PO CAPS: 1.0000 | ORAL_CAPSULE | Freq: Four times a day (QID) | ORAL | 0 refills | Status: DC | PRN

## 2017-12-28 MED ORDER — CLONAZEPAM 0.5 MG PO TABS: ORAL_TABLET | ORAL | 0 refills | Status: DC

## 2017-12-28 NOTE — Telephone Encounter (Signed)
Today I have utilized the [place] Controlled Substance Registry's online query to confirm compliance regarding the patient's controlled medications. My review reveals appropriate prescription fills and that I am the sole provider of these medications. Rechecks will occur regularly and the patient is aware of our use of the system.   Database shows pt has filled #120 Fioricet w/ codeine written by myself on 09/30/17 on 10/06/17, 11/03/17, and 11/30/17. It does not have any futher refills. Using [facility] on [address] in [place].  Has received #20 tylenol #3 from dentist Dr. Alexis Goodell at [address] in [place] also filled at same pharm  Also last filled rx for clonazepam 0.5mg  #120/mo written 09/30/17 on 10/14/17, 9/18, and 10/16 and is out of refills. Pt OV is not sched until 11/26 so will need one additional refill prior to visit which I sent in as well.

## 2018-01-12 DIAGNOSIS — M545 Low back pain: Secondary | ICD-10-CM | POA: Diagnosis not present

## 2018-01-19 ENCOUNTER — Encounter: Payer: Self-pay | Admitting: Family Medicine

## 2018-01-19 ENCOUNTER — Ambulatory Visit (INDEPENDENT_AMBULATORY_CARE_PROVIDER_SITE_OTHER): Payer: 59 | Admitting: Family Medicine

## 2018-01-19 ENCOUNTER — Ambulatory Visit: Payer: 59 | Admitting: Family Medicine

## 2018-01-19 ENCOUNTER — Other Ambulatory Visit: Payer: Self-pay

## 2018-01-19 VITALS — BP 136/84 | HR 82 | Temp 97.6°F | Ht 68.0 in | Wt 194.4 lb

## 2018-01-19 DIAGNOSIS — G2581 Restless legs syndrome: Secondary | ICD-10-CM | POA: Diagnosis not present

## 2018-01-19 DIAGNOSIS — E039 Hypothyroidism, unspecified: Secondary | ICD-10-CM

## 2018-01-19 DIAGNOSIS — E038 Other specified hypothyroidism: Secondary | ICD-10-CM

## 2018-01-19 DIAGNOSIS — G894 Chronic pain syndrome: Secondary | ICD-10-CM

## 2018-01-19 DIAGNOSIS — G8929 Other chronic pain: Secondary | ICD-10-CM

## 2018-01-19 DIAGNOSIS — M545 Low back pain, unspecified: Secondary | ICD-10-CM

## 2018-01-19 DIAGNOSIS — G43109 Migraine with aura, not intractable, without status migrainosus: Secondary | ICD-10-CM | POA: Diagnosis not present

## 2018-01-19 DIAGNOSIS — E559 Vitamin D deficiency, unspecified: Secondary | ICD-10-CM

## 2018-01-19 DIAGNOSIS — F411 Generalized anxiety disorder: Secondary | ICD-10-CM

## 2018-01-19 DIAGNOSIS — F330 Major depressive disorder, recurrent, mild: Secondary | ICD-10-CM | POA: Diagnosis not present

## 2018-01-19 DIAGNOSIS — F5101 Primary insomnia: Secondary | ICD-10-CM

## 2018-01-19 MED ORDER — CARBIDOPA-LEVODOPA ER 25-100 MG PO TBCR: 1.0000 | EXTENDED_RELEASE_TABLET | Freq: Every day | ORAL | 2 refills | Status: DC

## 2018-01-19 MED ORDER — BUTALBITAL-APAP-CAFF-COD 50-325-40-30 MG PO CAPS: 1.0000 | ORAL_CAPSULE | Freq: Four times a day (QID) | ORAL | 2 refills | Status: DC | PRN

## 2018-01-19 MED ORDER — OXYCODONE HCL 10 MG PO TABS: 10.0000 mg | ORAL_TABLET | Freq: Every evening | ORAL | 0 refills | Status: AC | PRN

## 2018-01-19 MED ORDER — OXYCODONE HCL 10 MG PO TABS: 10.0000 mg | ORAL_TABLET | Freq: Every evening | ORAL | 0 refills | Status: DC | PRN

## 2018-01-19 MED ORDER — CLONAZEPAM 0.5 MG PO TABS: ORAL_TABLET | ORAL | 2 refills | Status: DC

## 2018-01-19 NOTE — Patient Instructions (Signed)
° ° ° °  If you have lab work done today you will be contacted with your lab results within the next 2 weeks.  If you have not heard from us then please contact us. The fastest way to get your results is to register for My Chart. ° ° °IF you received an x-ray today, you will receive an invoice from [facility]. Please contact [facility] at [phone] with questions or concerns regarding your invoice.  ° °IF you received labwork today, you will receive an invoice from [facility]. Please contact [facility] at [phone] with questions or concerns regarding your invoice.  ° °Our billing staff will not be able to assist you with questions regarding bills from these companies. ° °You will be contacted with the lab results as soon as they are available. The fastest way to get your results is to activate your My Chart account. Instructions are located on the last page of this paperwork. If you have not heard from us regarding the results in 2 weeks, please contact this office. °  ° ° ° °
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
## 2018-01-20 ENCOUNTER — Encounter: Payer: Self-pay | Admitting: Family Medicine

## 2018-01-20 LAB — VITAMIN D 25 HYDROXY (VIT D DEFICIENCY, FRACTURES): Vit D, 25-Hydroxy: 48.4 ng/mL (ref 30.0–100.0)

## 2018-01-20 LAB — TSH+T4F+T3FREE
FREE T4: 1.71 ng/dL (ref 0.82–1.77)
T3, Free: 2.6 pg/mL (ref 2.0–4.4)
TSH: 0.813 u[IU]/mL (ref 0.450–4.500)

## 2018-01-22 MED ORDER — BUPROPION HCL ER (SR) 100 MG PO TB12: 100.0000 mg | ORAL_TABLET | Freq: Every day | ORAL | 2 refills | Status: DC

## 2018-01-22 MED ORDER — BUPROPION HCL ER (SR) 100 MG PO TB12: ORAL_TABLET | ORAL | 2 refills | Status: DC

## 2018-01-23 ENCOUNTER — Encounter: Payer: Self-pay | Admitting: Family Medicine

## 2018-01-23 DIAGNOSIS — M545 Low back pain: Secondary | ICD-10-CM | POA: Diagnosis not present

## 2018-01-23 DIAGNOSIS — F330 Major depressive disorder, recurrent, mild: Secondary | ICD-10-CM | POA: Insufficient documentation

## 2018-01-23 MED ORDER — ROPINIROLE HCL 0.5 MG PO TABS: ORAL_TABLET | ORAL | 1 refills | Status: DC

## 2018-01-23 MED ORDER — LEVOTHYROXINE SODIUM 50 MCG PO TABS: 50.0000 ug | ORAL_TABLET | Freq: Every day | ORAL | 1 refills | Status: DC

## 2018-02-13 ENCOUNTER — Other Ambulatory Visit: Payer: Self-pay | Admitting: Family Medicine

## 2018-02-14 MED ORDER — PROMETHAZINE HCL 25 MG PO TABS: 25.0000 mg | ORAL_TABLET | Freq: Three times a day (TID) | ORAL | 5 refills | Status: DC | PRN

## 2018-02-14 NOTE — Telephone Encounter (Signed)
Done.  Authorized 1 early refill on the fioricet w/ codiene as pt is going out of town to [place] over the [place] so needed before he left - still having more HAs as having tooth/root canal issues - seeing dentist.  Meds ordered this encounter  Medications  . buPROPion (WELLBUTRIN SR) 100 MG 12 hr tablet    Sig: Take 1 tablet (100 mg total) by mouth 2 (two) times daily. PLEASE SEE ATTACHED FOR DETAILED DIRECTIONS    Dispense:  180 tablet    Refill:  1  . promethazine (PHENERGAN) 25 MG tablet    Sig: Take 1 tablet (25 mg total) by mouth every 8 (eight) hours as needed for nausea or vomiting (migraine headache).    Dispense:  20 tablet    Refill:  5

## 2018-02-15 ENCOUNTER — Other Ambulatory Visit: Payer: Self-pay | Admitting: Family Medicine

## 2018-03-02 ENCOUNTER — Other Ambulatory Visit: Payer: Self-pay | Admitting: Family Medicine

## 2018-03-03 ENCOUNTER — Encounter: Payer: Self-pay | Admitting: Family Medicine

## 2018-03-10 ENCOUNTER — Other Ambulatory Visit: Payer: Self-pay | Admitting: Family Medicine

## 2018-03-10 DIAGNOSIS — G4700 Insomnia, unspecified: Secondary | ICD-10-CM

## 2018-03-10 NOTE — Telephone Encounter (Signed)
Requested Prescriptions  Pending Prescriptions Disp Refills  . traZODone (DESYREL) 150 MG tablet [Pharmacy Med Name: TRAZODONE 150 MG TABLET] 45 tablet 0    Sig: TAKE 1/2 TABLET AT BEDTIME     Psychiatry: Antidepressants - Serotonin Modulator Passed - 03/10/2018  4:22 AM      Passed - Completed PHQ-2 or PHQ-9 in the last 360 days.      Passed - Valid encounter within last 6 months    Recent Outpatient Visits          1 month ago Restless leg syndrome, uncontrolled   [facility] July, [facility]   4 months ago High risk medication use   [facility], [facility]ell July, [facility]   5 months ago Subclinical hypothyroidism   [facility], [facility]ell July, [facility]   5 months ago Other chronic pain   [facility], [facility]ell July, [facility]   6 months ago Nonspecific abnormal electrocardiogram (ECG) (EKG)   [facility], [facility]ell July, [facility]      Future Appointments            In 1 month Sherren Mocha, MD Primary Care at [place]

## 2018-03-11 ENCOUNTER — Encounter: Payer: Self-pay | Admitting: Family Medicine

## 2018-03-12 ENCOUNTER — Encounter: Payer: Self-pay | Admitting: Family Medicine

## 2018-03-16 MED ORDER — OXYCODONE HCL 10 MG PO TABS: 10.0000 mg | ORAL_TABLET | Freq: Every evening | ORAL | 0 refills | Status: DC | PRN

## 2018-03-16 NOTE — Telephone Encounter (Signed)
This has been handled. Nothing further needed. PDMP reviewed.

## 2018-03-24 DIAGNOSIS — M5136 Other intervertebral disc degeneration, lumbar region: Secondary | ICD-10-CM | POA: Diagnosis not present

## 2018-03-24 DIAGNOSIS — G894 Chronic pain syndrome: Secondary | ICD-10-CM | POA: Diagnosis not present

## 2018-03-27 ENCOUNTER — Other Ambulatory Visit: Payer: Self-pay | Admitting: Family Medicine

## 2018-04-07 ENCOUNTER — Telehealth: Payer: Self-pay | Admitting: Family Medicine

## 2018-04-07 DIAGNOSIS — G43109 Migraine with aura, not intractable, without status migrainosus: Secondary | ICD-10-CM

## 2018-04-07 MED ORDER — PROMETHAZINE HCL 25 MG PO TABS: 25.0000 mg | ORAL_TABLET | Freq: Three times a day (TID) | ORAL | 5 refills | Status: DC | PRN

## 2018-04-07 MED ORDER — BUTALBITAL-APAP-CAFF-COD 50-325-40-30 MG PO CAPS: 1.0000 | ORAL_CAPSULE | Freq: Four times a day (QID) | ORAL | 2 refills | Status: DC | PRN

## 2018-04-07 NOTE — Telephone Encounter (Signed)
Pt called me yesterday and requested refill on his fioricet with codeine and his promethazine for his migraine treatment.  In the future, he will need to get these medications from the neurologist who is managing his headaches - Dr. Annia Belt with [facility].  Spoke to Dr. Clovis Pu - who pt sees for pain management starting last month -  nurse who spoke to Dr. Ollen Bowl between patients and he noted that it was fine for pt to get a refill on his Fioricet w/ codeine for now - would not be considered a violating of his pain contract.

## 2018-04-11 ENCOUNTER — Other Ambulatory Visit: Payer: Self-pay | Admitting: Family Medicine

## 2018-04-21 ENCOUNTER — Ambulatory Visit: Payer: 59 | Admitting: Family Medicine

## 2018-04-23 ENCOUNTER — Telehealth: Payer: Self-pay | Admitting: Family Medicine

## 2018-04-23 DIAGNOSIS — F411 Generalized anxiety disorder: Secondary | ICD-10-CM

## 2018-04-23 NOTE — Telephone Encounter (Signed)
Called pt. and informed them of Dr. Shaw’s permanent absence and cancelation of their appt with her. Gave Dr. Shaw’s new practice and [facility].  °

## 2018-04-27 ENCOUNTER — Encounter: Payer: Self-pay | Admitting: Family Medicine

## 2018-04-27 DIAGNOSIS — G4700 Insomnia, unspecified: Secondary | ICD-10-CM

## 2018-04-27 DIAGNOSIS — G43109 Migraine with aura, not intractable, without status migrainosus: Secondary | ICD-10-CM

## 2018-04-27 MED ORDER — CLONAZEPAM 0.5 MG PO TABS: ORAL_TABLET | ORAL | 5 refills | Status: AC

## 2018-04-27 NOTE — Telephone Encounter (Signed)
Last wrote for clonazepam 0.5mg  #120 with 2 refills on 11/26 A 11/4 rx was filled 11/12. Then the 11/26 rx was filled 12/9, 1/7, and 2/5. Pt had on OV with me for 3/4 which was cancelled due to my removal from clinic. Will send in rx for 3 additional months to give pt time to establish with psychiatrist.

## 2018-04-28 ENCOUNTER — Ambulatory Visit: Payer: 59 | Admitting: Family Medicine

## 2018-04-28 MED ORDER — LEVOTHYROXINE SODIUM 50 MCG PO TABS: 50.0000 ug | ORAL_TABLET | Freq: Every day | ORAL | 1 refills | Status: AC

## 2018-04-28 MED ORDER — TRAZODONE HCL 150 MG PO TABS: 75.0000 mg | ORAL_TABLET | Freq: Every day | ORAL | 1 refills | Status: AC

## 2018-04-28 MED ORDER — PROMETHAZINE HCL 25 MG PO TABS: 25.0000 mg | ORAL_TABLET | Freq: Three times a day (TID) | ORAL | 5 refills | Status: DC | PRN

## 2018-04-28 MED ORDER — BUTALBITAL-APAP-CAFF-COD 50-325-40-30 MG PO CAPS: 1.0000 | ORAL_CAPSULE | Freq: Four times a day (QID) | ORAL | 5 refills | Status: AC | PRN

## 2018-04-28 NOTE — Telephone Encounter (Signed)
Talked w/ pt on the phone.  He needs refills on his Fioricet with codeine to bridge the transition between my departure and establishing with a new provider.  I encouraged him to establish within 3 mos or so far before he needs any refills on his Fioricet with codeine so he can make sure his new physician is comfortable prescribing this so has time to rethink His [facility] neurologist will not rx the Fioricet with codeine. I did check with Dr. Odette Fraction at [facility] who is prescribing is aware of the Fioricet with codeine rx - I called him last mo and pt reports that he discussed it with him at his visit as well. Dr. Ollen Bowl said that was fine to cont since has been working for his HAs and pt reports he has NOT signed a controlled drug contract yet.   Not needing propranolol hardly at all - only if he occ gets CP. Having restless legs very rarely now that he is on the oxycodone for pain control.  So he would like to remove it from his list as felt that the Sinemet worked better and he has a good amount left on that as not needing very often.  The requip caused intense abdominal pain.  Only taking wellbutrin SR 100 qam - worried that nighttime dose will worsen insomnia. Feels in a good place and no need to increase now. Does not need refill since has only been using half of rx'd dose.

## 2018-05-23 ENCOUNTER — Other Ambulatory Visit: Payer: Self-pay | Admitting: Family Medicine

## 2020-01-12 ENCOUNTER — Other Ambulatory Visit (HOSPITAL_COMMUNITY): Payer: Self-pay | Admitting: Neurosurgery

## 2020-01-12 DIAGNOSIS — M5136 Other intervertebral disc degeneration, lumbar region: Secondary | ICD-10-CM

## 2020-01-13 ENCOUNTER — Other Ambulatory Visit: Payer: Self-pay

## 2020-01-13 ENCOUNTER — Ambulatory Visit (HOSPITAL_COMMUNITY)
Admission: RE | Admit: 2020-01-13 | Discharge: 2020-01-13 | Disposition: A | Discharge status: Home / Self Care | Payer: 59 | Source: Ambulatory Visit | Attending: Neurosurgery | Admitting: Neurosurgery

## 2020-01-13 DIAGNOSIS — M5136 Other intervertebral disc degeneration, lumbar region: Secondary | ICD-10-CM | POA: Diagnosis present

## 2020-01-13 IMAGING — MR MR LUMBAR SPINE W/O CM
5 series · 31 of 48 positions shown · non-contrast
Comparison: [2I]

CLINICAL DATA: Chronic low back pain and right lower extremity pain

EXAM:
MRI LUMBAR SPINE WITHOUT CONTRAST
TECHNIQUE: Multiplanar, multisequence MR imaging of the lumbar spine was
performed. No intravenous contrast was administered.

[Series 5: T1 · sagittal · 4.0mm · 0.72mm/px · 6 of 17 slices shown (1 of 2)]
[im 1/17]
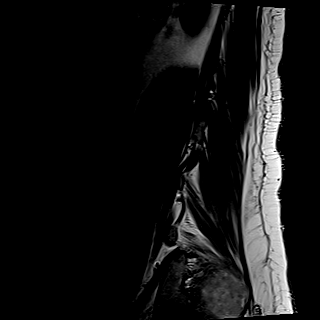
[im 4/17]
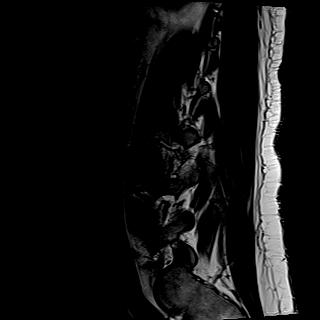
[im 7/17]
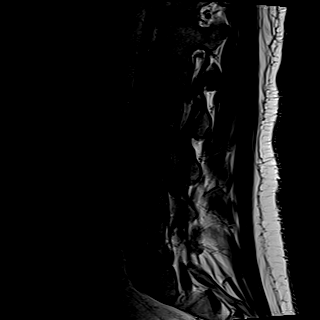
[im 10/17]
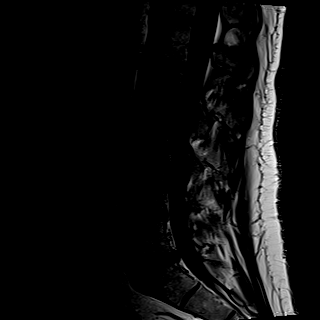
[im 13/17]
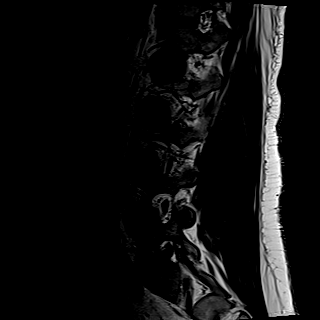
[im 17/17]
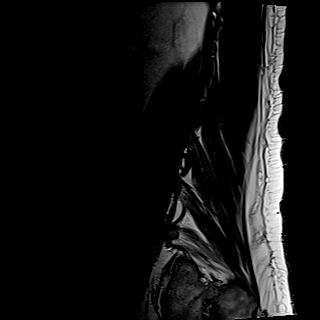

[Series 6: T2 · sagittal · 4.0mm · 0.73mm/px · 7 of 17 slices shown (1 of 2)]
[im 1/17]
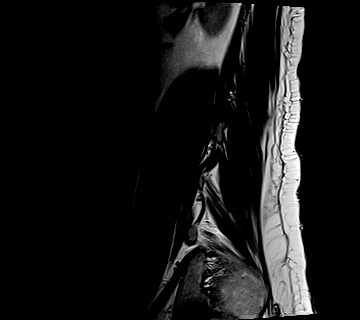
[im 3/17]
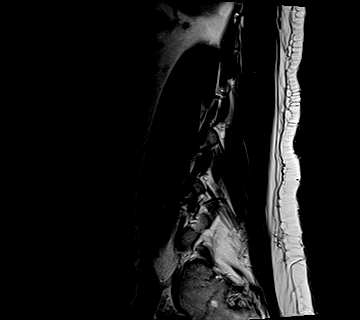
[im 6/17]
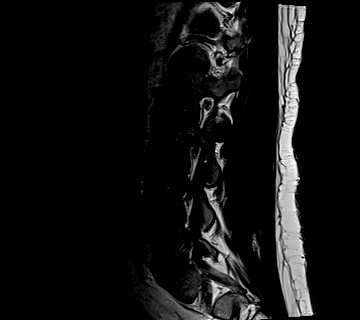
[im 9/17]
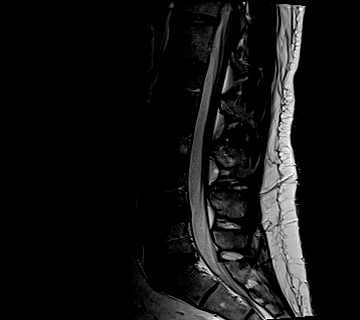
[im 11/17]
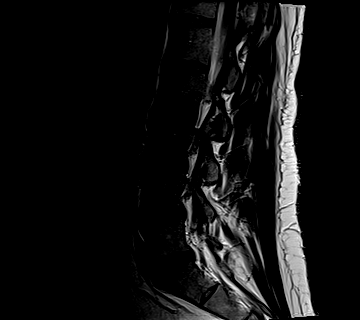
[im 14/17]
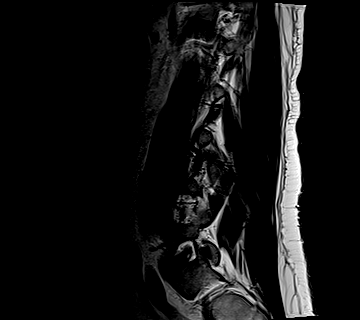
[im 17/17]
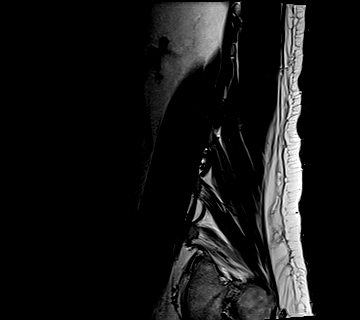

[Series 7: STIR · sagittal · 4.0mm · 0.51mm/px · 2 of 17 slices shown]
[im 1/17]
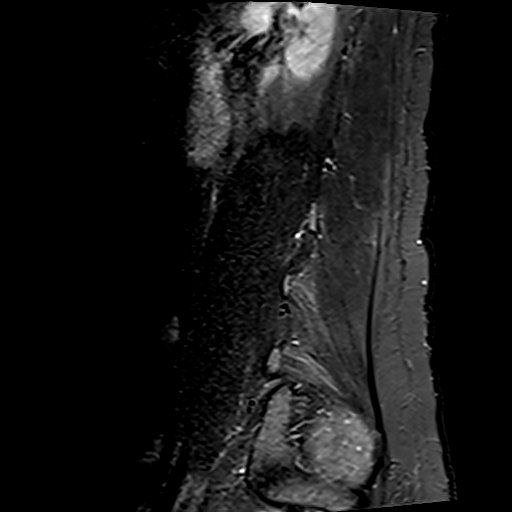
[im 3/17]
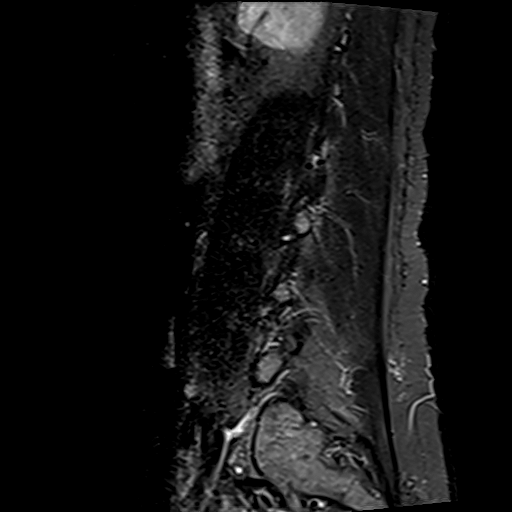

[Series 8: T2 · axial · 4.0mm · 0.62mm/px · z∈[-67,+137]mm · 8 of 35 slices shown (2 of 2)]
[im 1/35]
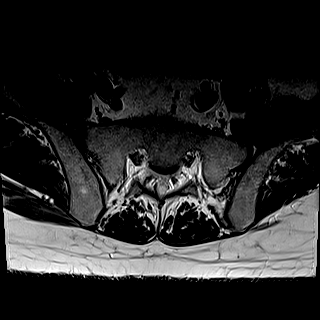
[im 6/35]
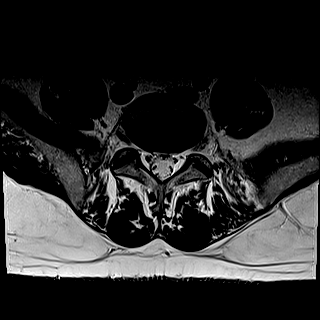
[im 11/35]
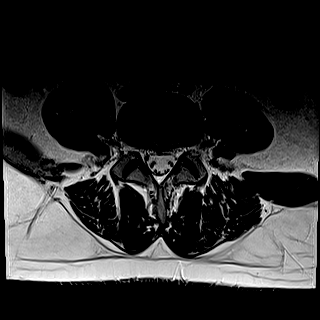
[im 16/35]
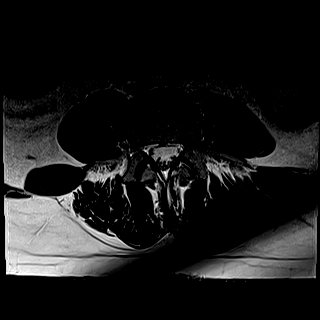
[im 19/35]
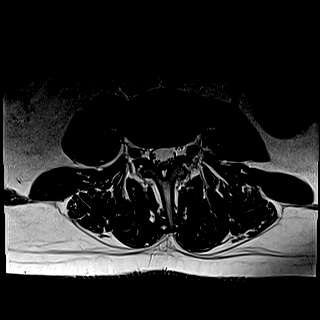
[im 24/35]
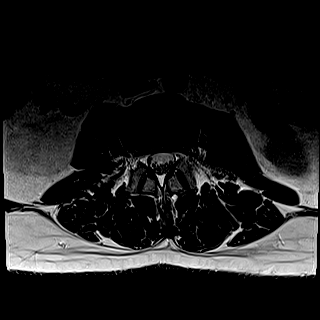
[im 29/35]
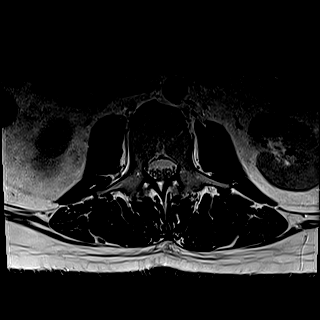
[im 35/35]
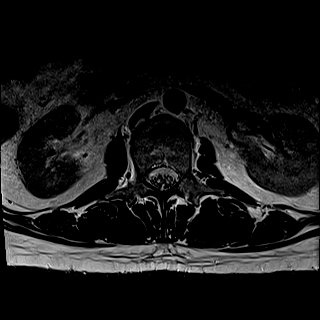

[Series 9: T1 · axial · 4.0mm · 0.39mm/px · z∈[-67,+137]mm · 8 of 35 slices shown (2 of 2)]
[im 1/35]
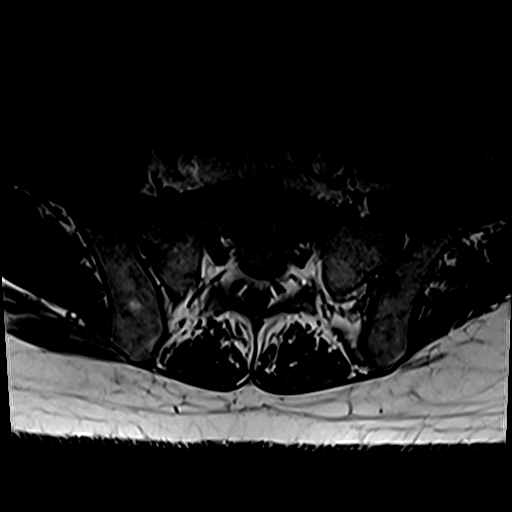
[im 6/35]
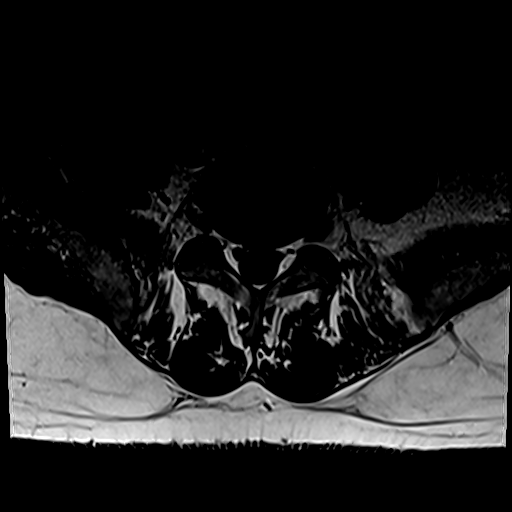
[im 11/35]
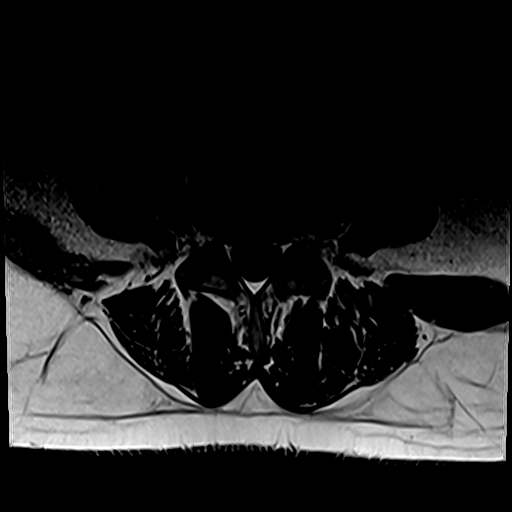
[im 16/35]
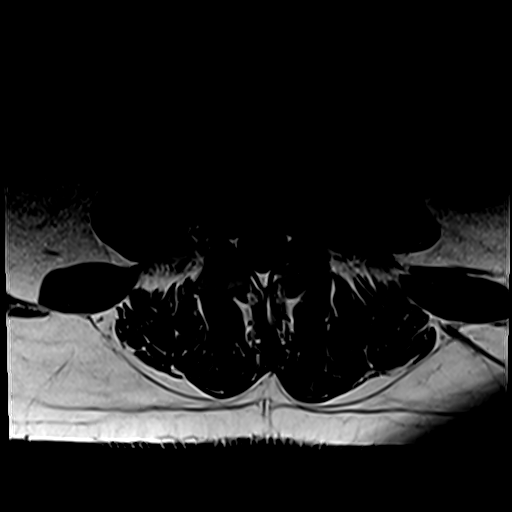
[im 19/35]
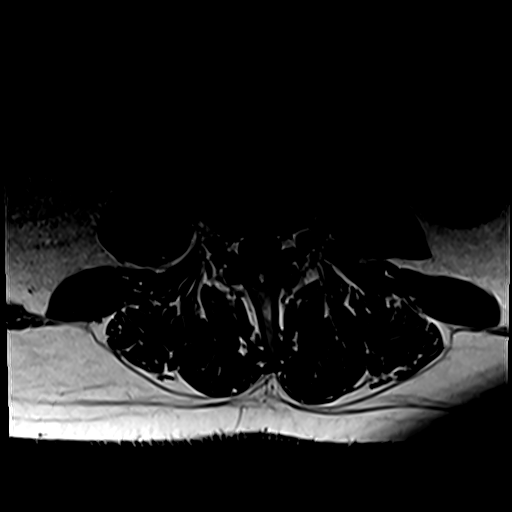
[im 24/35]
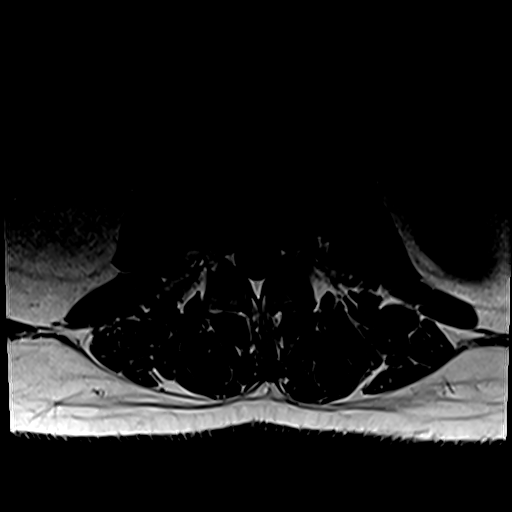
[im 29/35]
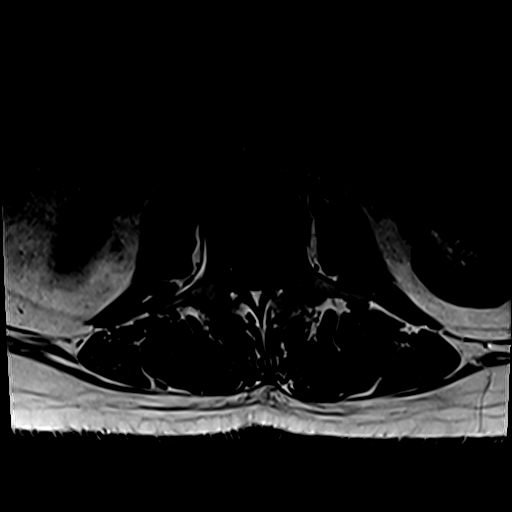
[im 35/35]
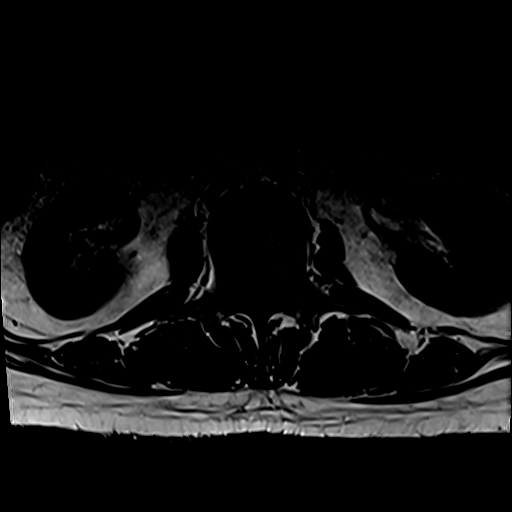

[31 of 48 positions shown; findings below may reference images not displayed]

FINDINGS: Segmentation:  Standard.

Alignment:  Preserved.

Vertebrae: Vertebral body heights are maintained. There is no marrow
edema. There is no suspicious osseous lesion.

Conus medullaris and cauda equina: Conus extends to the L1 level.
Conus and cauda equina appear normal.

Paraspinal and other soft tissues: Unremarkable.

Disc levels:

L1-L2:  No stenosis.

L2-L3:  No stenosis.

L3-L4:  No stenosis.

L4-L5: Disc desiccation without height loss. Minimally progressed
small central disc protrusion. Annular fissure is no longer present.
No stenosis.

L5-S1: Small central disc protrusion and annular fissure. No
stenosis. Appearance is unchanged.
IMPRESSION: Similar appearance of minor lower lumbar degenerative changes. No
stenosis.

## 2020-05-09 ENCOUNTER — Other Ambulatory Visit: Payer: Self-pay

## 2020-05-09 ENCOUNTER — Encounter (HOSPITAL_BASED_OUTPATIENT_CLINIC_OR_DEPARTMENT_OTHER): Payer: Self-pay

## 2020-05-09 ENCOUNTER — Emergency Department (HOSPITAL_BASED_OUTPATIENT_CLINIC_OR_DEPARTMENT_OTHER): Payer: 59

## 2020-05-09 ENCOUNTER — Observation Stay (HOSPITAL_BASED_OUTPATIENT_CLINIC_OR_DEPARTMENT_OTHER)
Admission: EM | Admit: 2020-05-09 | Discharge: 2020-05-10 | Disposition: A | Discharge status: Home / Self Care | Payer: 59 | Attending: Internal Medicine | Admitting: Internal Medicine

## 2020-05-09 DIAGNOSIS — R079 Chest pain, unspecified: Secondary | ICD-10-CM

## 2020-05-09 DIAGNOSIS — R200 Anesthesia of skin: Secondary | ICD-10-CM | POA: Diagnosis not present

## 2020-05-09 DIAGNOSIS — Z87891 Personal history of nicotine dependence: Secondary | ICD-10-CM | POA: Diagnosis not present

## 2020-05-09 DIAGNOSIS — G459 Transient cerebral ischemic attack, unspecified: Secondary | ICD-10-CM | POA: Diagnosis present

## 2020-05-09 DIAGNOSIS — R479 Unspecified speech disturbances: Secondary | ICD-10-CM | POA: Diagnosis not present

## 2020-05-09 DIAGNOSIS — E876 Hypokalemia: Secondary | ICD-10-CM | POA: Diagnosis not present

## 2020-05-09 DIAGNOSIS — R531 Weakness: Principal | ICD-10-CM | POA: Insufficient documentation

## 2020-05-09 DIAGNOSIS — E039 Hypothyroidism, unspecified: Secondary | ICD-10-CM | POA: Diagnosis not present

## 2020-05-09 DIAGNOSIS — Z20822 Contact with and (suspected) exposure to covid-19: Secondary | ICD-10-CM | POA: Insufficient documentation

## 2020-05-09 DIAGNOSIS — R0789 Other chest pain: Secondary | ICD-10-CM | POA: Diagnosis present

## 2020-05-09 DIAGNOSIS — F419 Anxiety disorder, unspecified: Secondary | ICD-10-CM

## 2020-05-09 DIAGNOSIS — Z79899 Other long term (current) drug therapy: Secondary | ICD-10-CM | POA: Insufficient documentation

## 2020-05-09 DIAGNOSIS — I10 Essential (primary) hypertension: Secondary | ICD-10-CM | POA: Diagnosis not present

## 2020-05-09 DIAGNOSIS — G4700 Insomnia, unspecified: Secondary | ICD-10-CM | POA: Diagnosis present

## 2020-05-09 LAB — RESP PANEL BY RT-PCR (FLU A&B, COVID) ARPGX2
Influenza A by PCR: NEGATIVE
Influenza B by PCR: NEGATIVE
SARS Coronavirus 2 by RT PCR: NEGATIVE

## 2020-05-09 LAB — COMPREHENSIVE METABOLIC PANEL
ALT: 16 U/L (ref 0–44)
AST: 19 U/L (ref 15–41)
Albumin: 4.7 g/dL (ref 3.5–5.0)
Alkaline Phosphatase: 73 U/L (ref 38–126)
Anion gap: 10 (ref 5–15)
BUN: 11 mg/dL (ref 6–20)
CO2: 27 mmol/L (ref 22–32)
Calcium: 8.8 mg/dL — ABNORMAL LOW (ref 8.9–10.3)
Chloride: 99 mmol/L (ref 98–111)
Creatinine, Ser: 0.87 mg/dL (ref 0.61–1.24)
GFR, Estimated: >60 mL/min (ref >60)
Glucose, Bld: 95 mg/dL (ref 70–99)
Potassium: 3.1 mmol/L — ABNORMAL LOW (ref 3.5–5.1)
Sodium: 136 mmol/L (ref 135–145)
Total Bilirubin: 0.2 mg/dL — ABNORMAL LOW (ref 0.3–1.2)
Total Protein: 7.6 g/dL (ref 6.5–8.1)

## 2020-05-09 LAB — TROPONIN I (HIGH SENSITIVITY)
Troponin I (High Sensitivity): 3 ng/L (ref <18)
Troponin I (High Sensitivity): 3 ng/L (ref <18)

## 2020-05-09 LAB — CBC WITH DIFFERENTIAL/PLATELET
Abs Immature Granulocytes: 0.01 10*3/uL (ref 0.00–0.07)
Basophils Absolute: 0.1 10*3/uL (ref 0.0–0.1)
Basophils Relative: 1 %
Eosinophils Absolute: 0.4 10*3/uL (ref 0.0–0.5)
Eosinophils Relative: 5 %
HCT: 43.6 % (ref 39.0–52.0)
Hemoglobin: 15.1 g/dL (ref 13.0–17.0)
Immature Granulocytes: 0 %
Lymphocytes Relative: 47 %
Lymphs Abs: 4.2 10*3/uL — ABNORMAL HIGH (ref 0.7–4.0)
MCH: 31.7 pg (ref 26.0–34.0)
MCHC: 34.6 g/dL (ref 30.0–36.0)
MCV: 91.4 fL (ref 80.0–100.0)
Monocytes Absolute: 0.7 10*3/uL (ref 0.1–1.0)
Monocytes Relative: 8 %
Neutro Abs: 3.5 10*3/uL (ref 1.7–7.7)
Neutrophils Relative %: 39 %
Platelets: 327 10*3/uL (ref 150–400)
RBC: 4.77 MIL/uL (ref 4.22–5.81)
RDW: 11.7 % (ref 11.5–15.5)
WBC: 8.9 10*3/uL (ref 4.0–10.5)
nRBC: 0 % (ref 0.0–0.2)

## 2020-05-09 LAB — MAGNESIUM: Magnesium: 2 mg/dL (ref 1.7–2.4)

## 2020-05-09 IMAGING — DX DG CHEST 1V PORT
1 series · 1 of 1 positions shown · non-contrast
Comparison: [DATE].

CLINICAL DATA: Chest pain.

EXAM:
PORTABLE CHEST 1 VIEW

[chest ap]
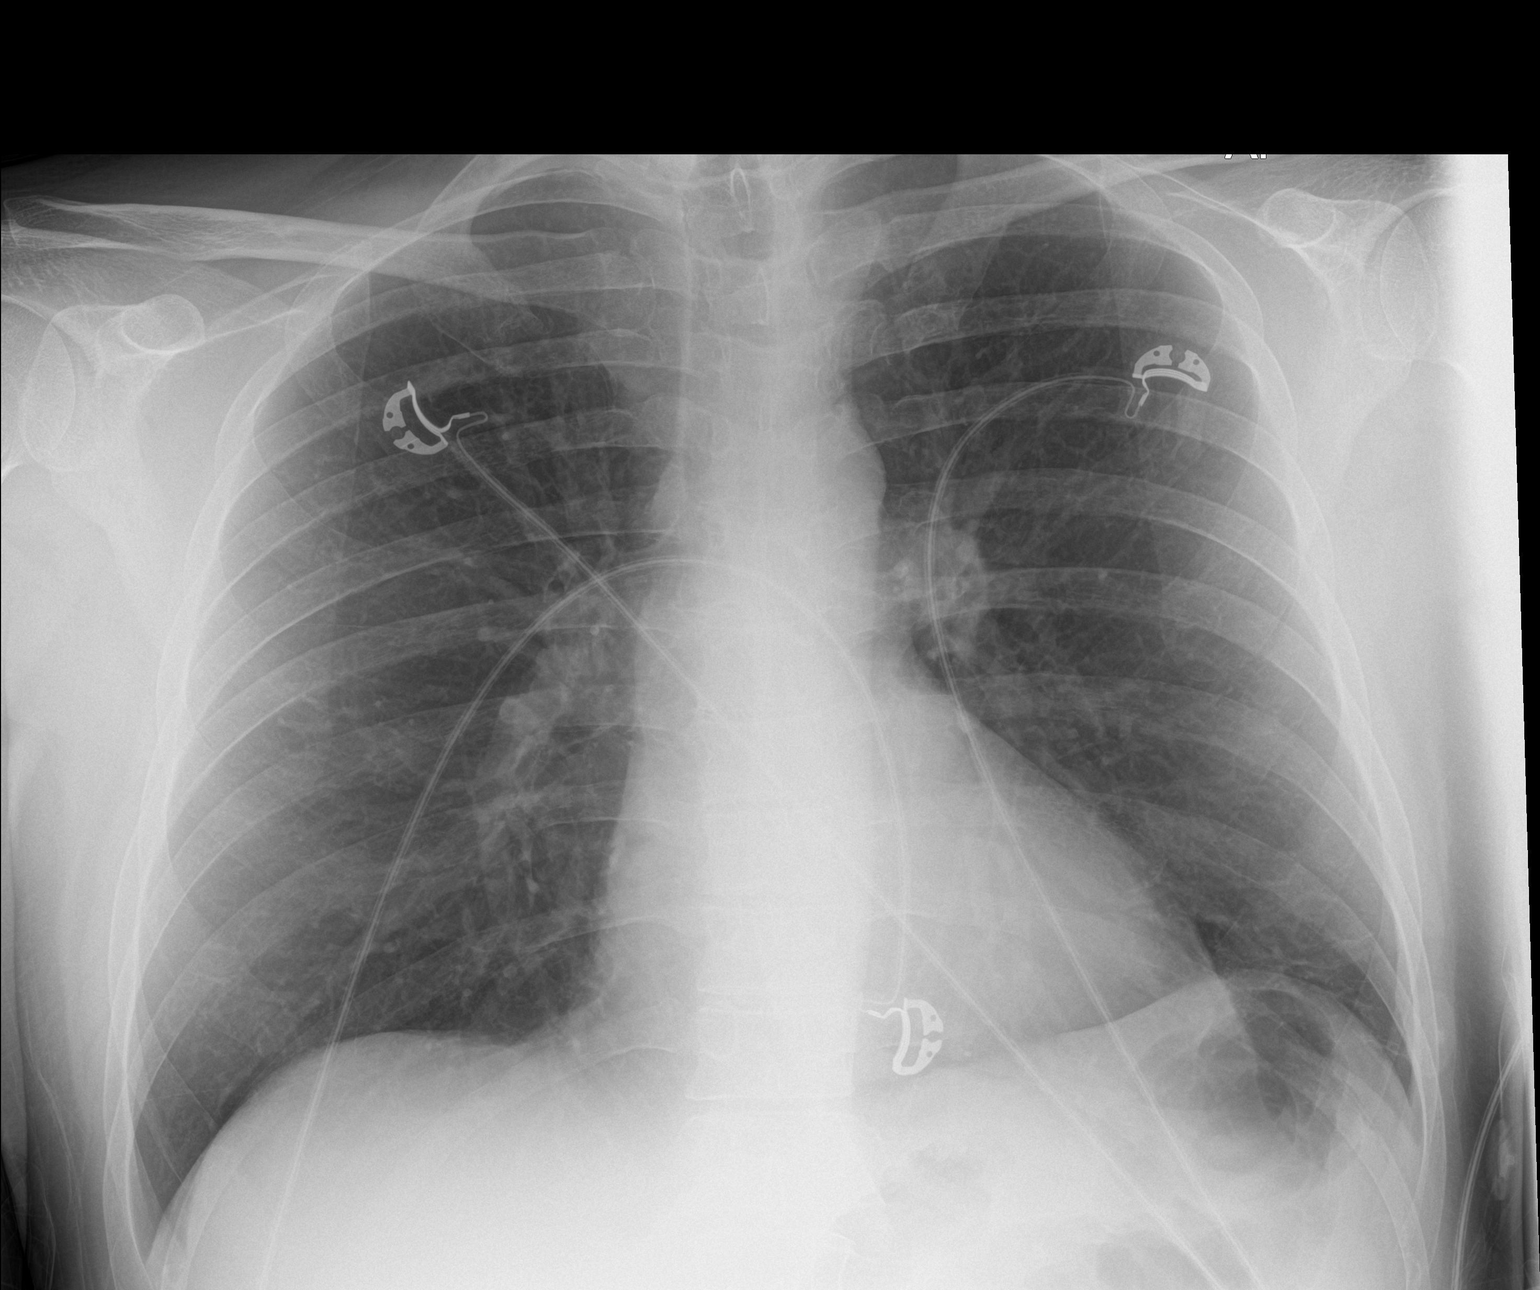

[1 of 1 positions shown; findings below may reference images not displayed]

FINDINGS: The heart size and mediastinal contours are within normal limits.
Both lungs are clear. No pneumothorax or pleural effusion is noted.
The visualized skeletal structures are unremarkable.
IMPRESSION: No active disease.

## 2020-05-09 IMAGING — CT CT HEAD W/O CM
3 series · 16 of 47 positions shown, 19 images · non-contrast
Comparison: [DATE].

CLINICAL DATA: Neuro deficit, suspected stroke. Altered mental
status for 1 week.

EXAM:
CT HEAD WITHOUT CONTRAST
TECHNIQUE: Contiguous axial images were obtained from the base of the skull
through the vertex without intravenous contrast.

[Series 2: head wo · axial · 0.44mm/px · z∈[-183,-38]mm · 10 of 35 slices shown, 13 images]
[im 3/35  brain]
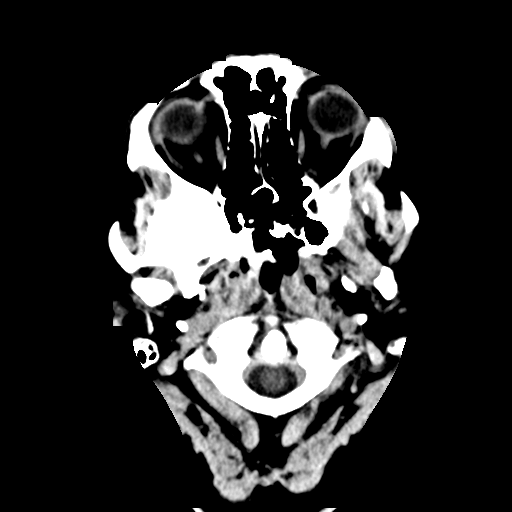
[im 3/35  bone]
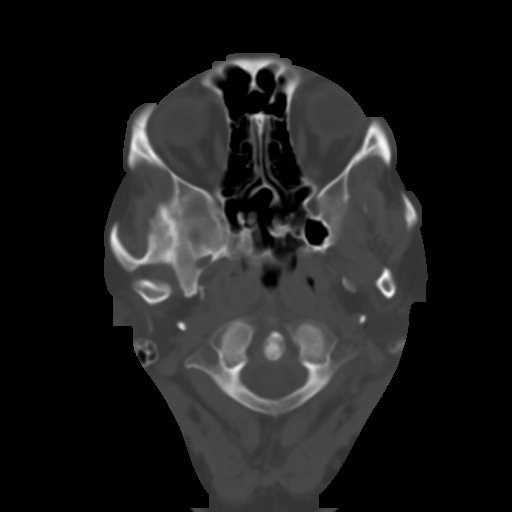
[im 6/35  brain]
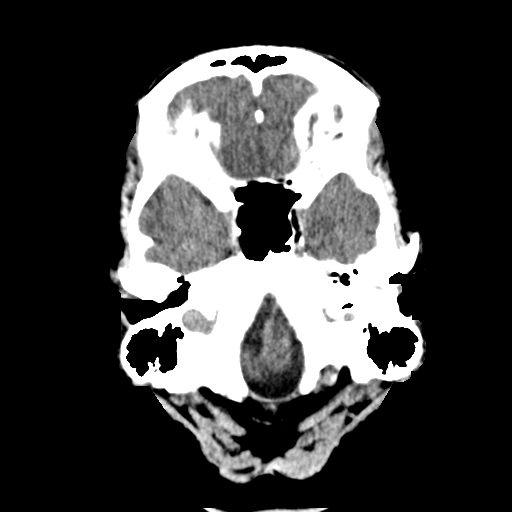
[im 10/35  brain]
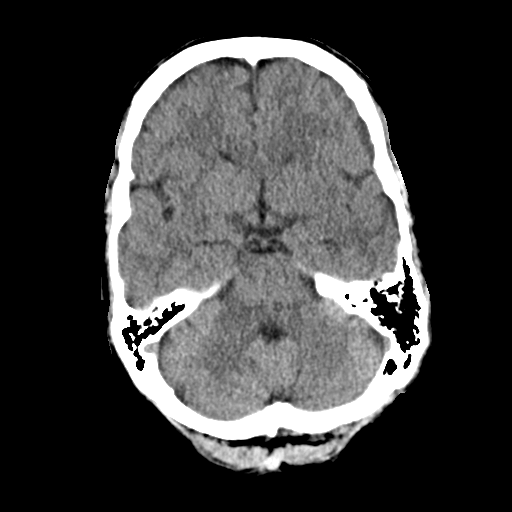
[im 12/35  brain]
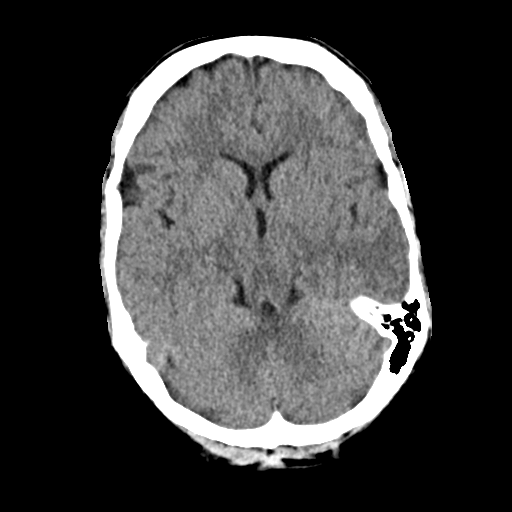
[im 16/35  brain]
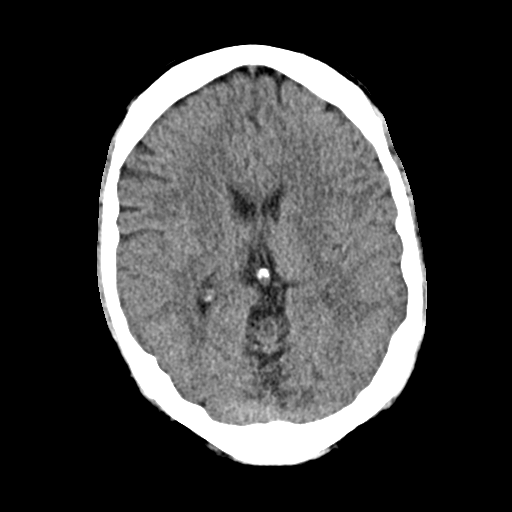
[im 16/35  bone]
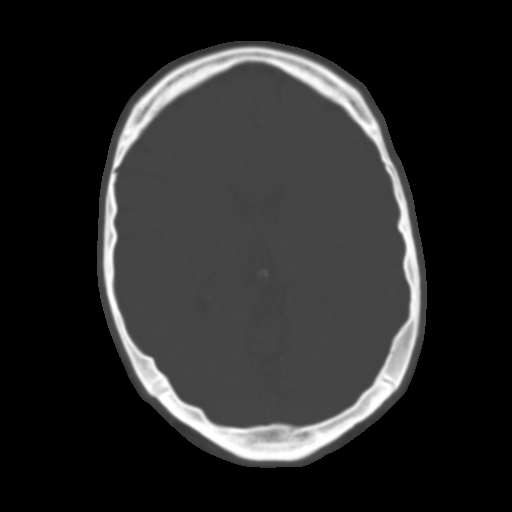
[im 19/35  brain]
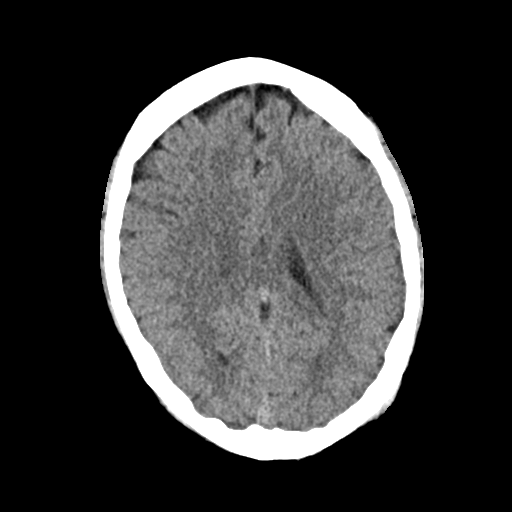
[im 23/35  brain]
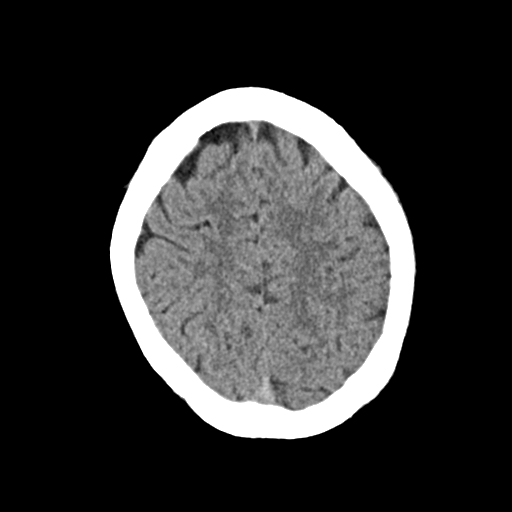
[im 26/35  brain]
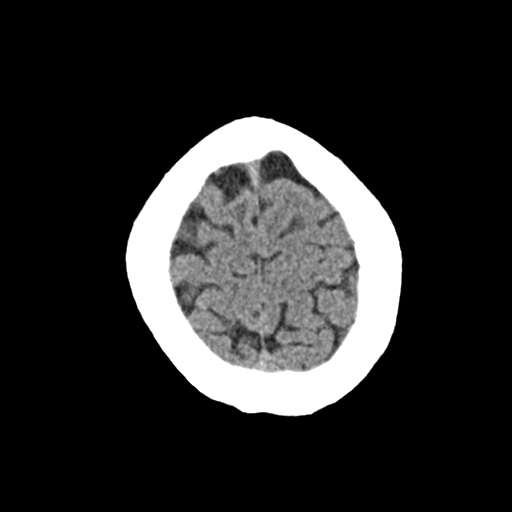
[im 29/35  brain]
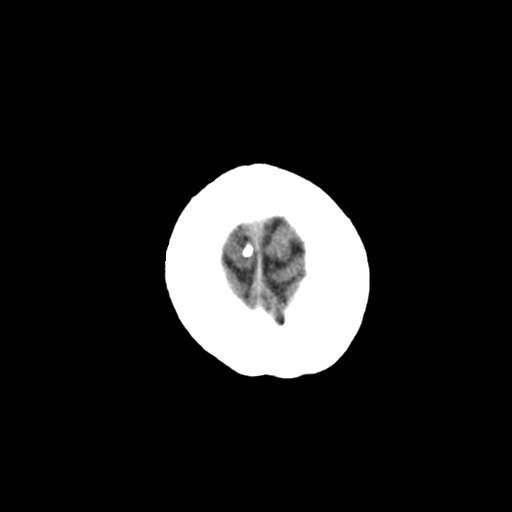
[im 29/35  bone]
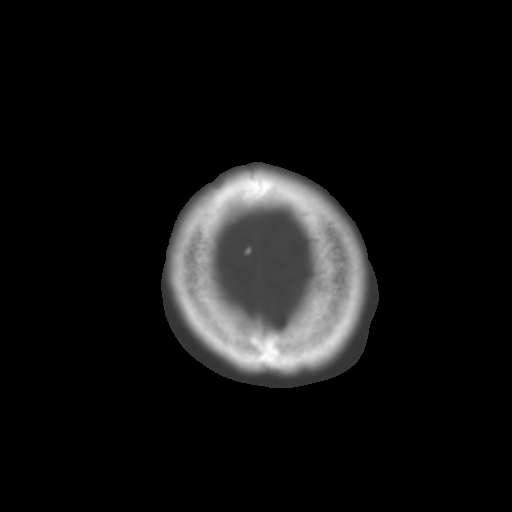
[im 32/35  brain]
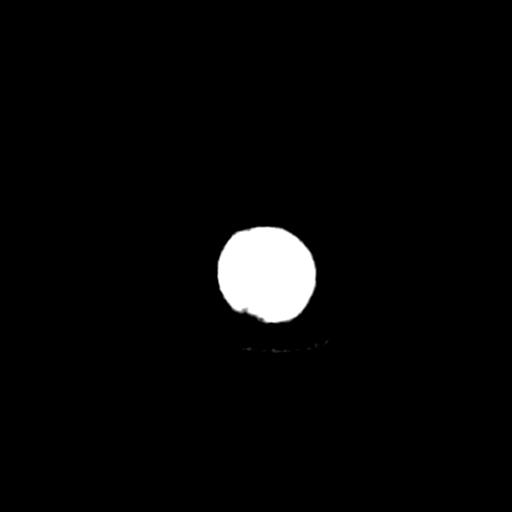

[Series 4: coronal soft · coronal · 0.35mm/px · 3 of 69 slices shown]
[im 23/69  brain]
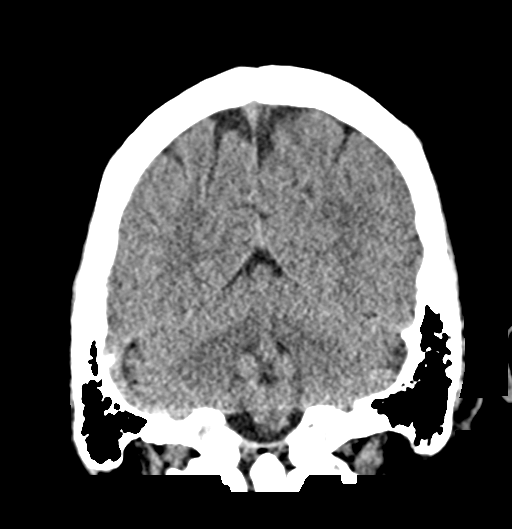
[im 31/69  brain]
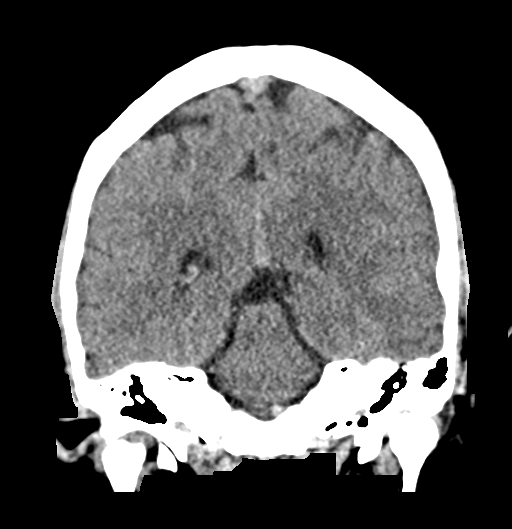
[im 38/69  brain]
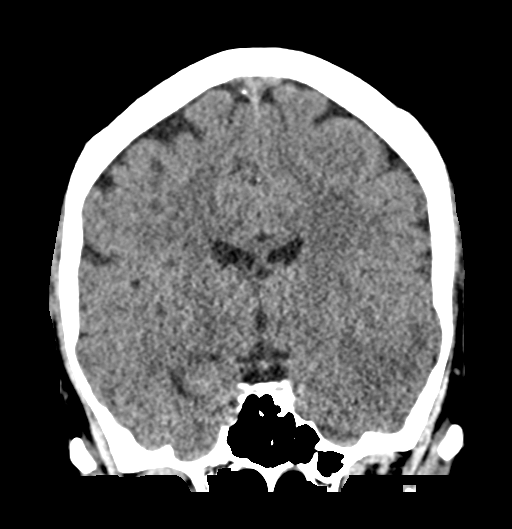

[Series 5: sag soft · sagittal · 0.35mm/px · 3 of 55 slices shown]
[im 19/55  brain]
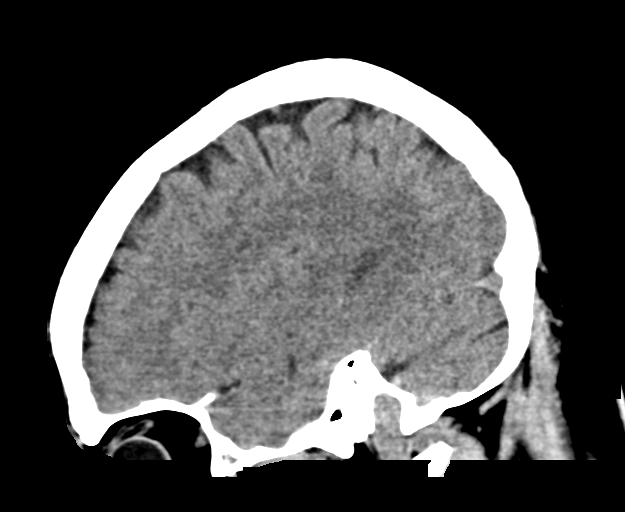
[im 28/55  brain]
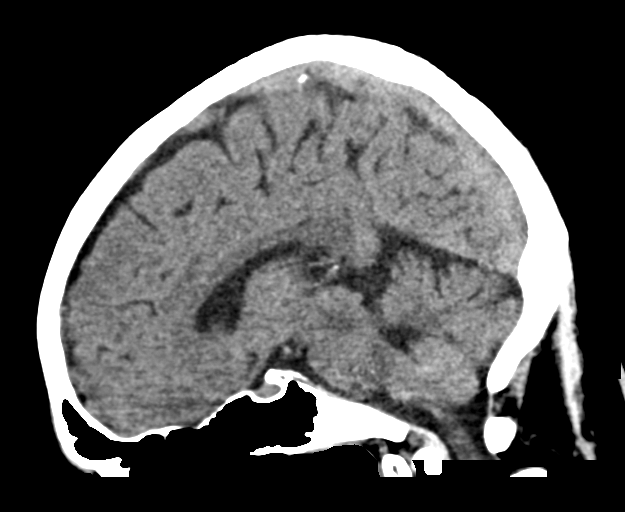
[im 37/55  brain]
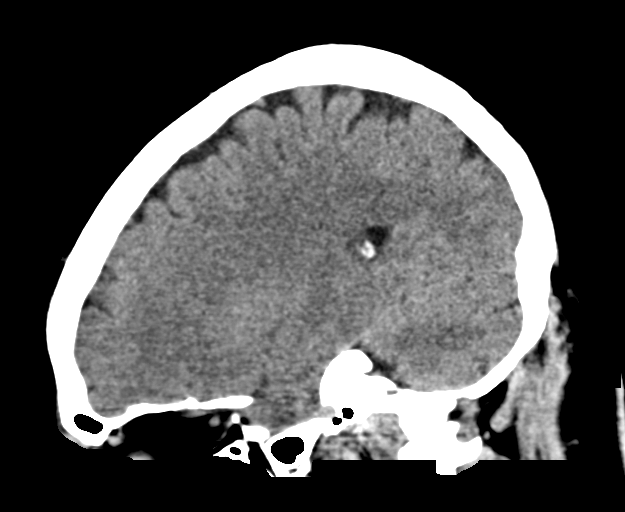

[16 of 47 positions shown; findings below may reference images not displayed]

FINDINGS: Brain: No evidence of acute infarction, hemorrhage, hydrocephalus,
extra-axial collection or mass lesion/mass effect.

Vascular: No hyperdense vessel or unexpected calcification.

Skull: Normal. Negative for fracture or focal lesion.

Sinuses/Orbits: Visualized paranasal sinuses and orbits are
unremarkable.

Other: None.
IMPRESSION: No acute intracranial abnormality.

## 2020-05-09 MED ORDER — OXYCODONE HCL 5 MG PO TABS
10.0000 mg | ORAL_TABLET | Freq: Four times a day (QID) | ORAL | Status: DC
  Administered 2020-05-10 (×2): 10 mg via ORAL
  Filled 2020-05-09 (×2): qty 2

## 2020-05-09 MED ORDER — TRAZODONE HCL 150 MG PO TABS
75.0000 mg | ORAL_TABLET | Freq: Every day | ORAL | Status: DC
  Administered 2020-05-10: 75 mg via ORAL
  Filled 2020-05-09: qty 1

## 2020-05-09 MED ORDER — LORAZEPAM 2 MG/ML IJ SOLN
0.5000 mg | Freq: Once | INTRAMUSCULAR | Status: AC | PRN
  Administered 2020-05-09: 0.5 mg via INTRAVENOUS
  Filled 2020-05-09: qty 1

## 2020-05-09 MED ORDER — CLONAZEPAM 1 MG PO TABS
1.0000 mg | ORAL_TABLET | Freq: Every day | ORAL | Status: DC
  Administered 2020-05-10: 1 mg via ORAL
  Filled 2020-05-09 (×2): qty 1

## 2020-05-09 MED ORDER — LEVOTHYROXINE SODIUM 50 MCG PO TABS
50.0000 ug | ORAL_TABLET | Freq: Every day | ORAL | Status: DC
  Filled 2020-05-09: qty 1

## 2020-05-09 MED ORDER — LACTATED RINGERS IV BOLUS
1000.0000 mL | Freq: Once | INTRAVENOUS | Status: AC
  Administered 2020-05-09: 1000 mL via INTRAVENOUS

## 2020-05-09 MED ORDER — MAGNESIUM OXIDE 400 (241.3 MG) MG PO TABS: 200.0000 mg | ORAL_TABLET | Freq: Every day | ORAL | Status: DC

## 2020-05-09 MED ORDER — LEVOTHYROXINE SODIUM 25 MCG PO TABS: 50.0000 ug | ORAL_TABLET | Freq: Every day | ORAL | Status: DC

## 2020-05-09 MED ORDER — POTASSIUM CHLORIDE CRYS ER 20 MEQ PO TBCR
40.0000 meq | EXTENDED_RELEASE_TABLET | Freq: Once | ORAL | Status: AC
  Administered 2020-05-09: 40 meq via ORAL
  Filled 2020-05-09: qty 2

## 2020-05-09 NOTE — Care Plan (Signed)
Was called by ED physician, Dr. Rubin Payor. Patient 40 year old gentleman, history of migraines, chronic back pain, major depressive disorder, generalized anxiety disorder, presenting today ED with complaints of chest pain x1 week, episodes of difficulty speaking, weakness and numbness on the left side and sometimes on the right side. Family insisted on evaluation for chest pain as patient has been trying to get 2D echo in the outpatient setting.  Head CT done unremarkable.  Cardiac enzymes negative x2.  EKG with no ischemic changes. Patient evaluated by telemetry neurology, who was concerned about probable TIA and recommended admission for stroke work-up.  Telemetry neurology also concern for possible multiple sclerosis and recommending MRI of the brain and C-spine with and without contrast. Patient accepted to telemetry.  Will need full stroke work-up and also 2D echo for completion of chest pain work-up.  Will likely need formal neurology evaluation on presentation.  No charge.

## 2020-05-09 NOTE — ED Notes (Signed)
Called [facility] for consult with dr. [phone]

## 2020-05-09 NOTE — ED Notes (Signed)
Patient transported to CT 

## 2020-05-09 NOTE — ED Notes (Signed)
ED Provider at bedside. 

## 2020-05-09 NOTE — ED Provider Notes (Signed)
  Physical Exam  BP (!) 125/102   Pulse 85   Temp 98.1 F (36.7 C) (Oral)   Resp 16   Ht 5\' 8"  (1.727 m)   Wt 81.6 kg   SpO2 100%   BMI 27.37 kg/m   Physical Exam  ED Course/Procedures     Procedures  MDM  Received patient signout.  Chest pain and various neurologic deficits.  EKG reassuring.  Troponin negative x2.  However seen by neurology.  Thinks it could be some abnormal findings on CT scan.  Also potentially worried for stroke and other pathology such as MS.  Will require admission the hospital.  Discussed with Dr. , who will admit.       [name], MD 05/09/20 [phone]

## 2020-05-09 NOTE — ED Notes (Signed)
Pt is on screen with teleneuro.

## 2020-05-09 NOTE — Consult Note (Signed)
[facility] Tele[facility]urology Consult Services  Stat Consult  Date of Service:   05/09/2020 13:50:49  Diagnosis:     .  G45.9 - Transient cerebral ischemic attack, unspecified  Impression: Noting acute onset of symptoms and but I feel is an abnormal CT, I would have to say he might've had a TIA. However, he fatigues easily and he also reports heat sensitivity and at his age without any vascular risk factors I would be concerned about multiple sclerosis. He needs to have MRI of the brain and cervical spine both without with contrast. Further studies will depend on the findings.  CT HEAD: Showed No Acute Hemorrhage or Acute Core Infarct Reviewed There are two remote small lacunar infarct, one in the left thalamus and one in the basal ganglia region on the right.  Our recommendations are outlined below.  Laboratory Studies: Recommend Lipid panel Hemoglobin A1c  Medication: Initiate Aspirin 81 mg daily  Nursing Recommendations: Telemetry, IV Fluids, avoid dextrose containing fluids, Maintain euglycemia Neuro checks q4 hrs x 24 hrs and then per shift Head of bed 30 degrees  Consultations: Recommend Speech therapy if failed dysphagia screen Physical therapy/Occupational therapy  DVT Prophylaxis: Choice of Primary Team  Disposition: Neurology will follow   Metrics: [facility] Notification Time: 05/09/2020 13:48:09 Stamp Time: 05/09/2020 13:50:49 Callback Response Time: 05/09/2020 13:52:51   ----------------------------------------------------------------------------------------------------  Chief Complaint: Left sided weakness and difficulty speaking  History of Present Illness: Patient is a 40 year old Male.  This 40 year old man who was with his mother has had intermittent difficulty with his speech today. He also had some chest pain. His mother said he is very stressed. He was speaking incoherently and not using real words. He also had some weakness and  numbness of the left side of his body. He had no vision loss. He had an episode of loss of vision in one eye over 30 years ago when he had his first migraine. He fatigues easily. He has had vasovagal syncope. He seen by a cardiologist for what sounds like left ventricular hypertrophy.   Past Medical History:     . There is NO history of Hypertension     . There is NO history of Diabetes Mellitus     . There is NO history of Hyperlipidemia     . There is NO history of Atrial Fibrillation     . There is NO history of Coronary Artery Disease     . There is NO history of Stroke     . There is NO history of Covid-19     . Restless legs. He is allergic to gabapentin, NSAIDs, and hydrocodone.  Anticoagulant use:  No  Antiplatelet use: No   Examination: BP(127/94), Pulse(79), Blood Glucose(95) 1A: Level of Consciousness - Alert; keenly responsive + 0 1B: Ask Month and Age - Both Questions Right + 0 1C: Blink Eyes & Squeeze Hands - Performs Both Tasks + 0 2: Test Horizontal Extraocular Movements - Normal + 0 3: Test Visual Fields - No Visual Loss + 0 4: Test Facial Palsy (Use Grimace if Obtunded) - Normal symmetry + 0 5A: Test Left Arm Motor Drift - No Drift for 10 Seconds + 0 5B: Test Right Arm Motor Drift - No Drift for 10 Seconds + 0 6A: Test Left Leg Motor Drift - No Drift for 5 Seconds + 0 6B: Test Right Leg Motor Drift - No Drift for 5 Seconds + 0 7: Test Limb Ataxia (FNF/Heel-Shin) - No Ataxia + 0 8:  Test Sensation - Normal; No sensory loss + 0 9: Test Language/Aphasia - Normal; No aphasia + 0 10: Test Dysarthria - Normal + 0 11: Test Extinction/Inattention - No abnormality + 0  NIHSS Score: 0    Patient / Family was informed the Neurology Consult would occur via TeleHealth consult by way of interactive audio and video telecommunications and consented to receiving care in this manner.  Patient is being evaluated for possible acute neurologic impairment and high probability of  imminent or life - threatening deterioration.I spent total of 35 minutes providing care to this patient, including time for face to face visit via telemedicine, review of medical records, imaging studies and discussion of findings with providers, the patient and / or family.   Dr Elray Mcgregor   [facility] [phone]  Case [id number]

## 2020-05-09 NOTE — ED Notes (Signed)
MD at bedside. Will get vitals when finished.

## 2020-05-09 NOTE — ED Triage Notes (Signed)
Pt arrives with c/o AMS, feeling dizzy, and CP X1 week. Was seen at [facility] and advised to come to ED.

## 2020-05-09 NOTE — ED Provider Notes (Signed)
[facility] EMERGENCY DEPARTMENT Provider Note   CSN: [id number] Arrival date & time: 05/09/20  1126     History Chief Complaint  Patient presents with  . Chest Pain  . Altered Mental Status    Dalton Kelly is a 40 y.o. male.  HPI      40yo male with history of migraines, chronic back pain, major depressive disorder, generalized anxiety disorder, presents with concern for chest pain for one week, episodes of difficulty speaking, episode of weakness and numbness on left.    Chest pain for about a week, across the chest pain and pressure with sharpness in the center at times, not positional, is worse with stress, better with propranolol at first but not anymore, is somewhat worse with exertion when asked again. 149/106 BP at home while on couch, concerned about elevated blood pressures, not on blood pressure medication  Has not been eating or sleeping well, 3 days of not eating or drinking much  Has had intermittent difficulty speaking over the last week, works as a Pharmacist, hospital sees patients and some of his patients asked if he was ok because some of the words he was saying were not making sense but he was not aware of it at the time.  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 Anxiety   . Back pain   . Barbiturate dependence (HCC)   . Episode of syncope    Recent that sounds vasovagal  . Fatigue   . Generalized anxiety disorder   . Hair loss   . Insomnia   . Irregular bowel habits   . Migraines   . Opioid dependence in controlled environment (HCC)   . Systolic click     Patient Active Problem List   Diagnosis Date Noted  . Mild episode of recurrent major depressive disorder (HCC) 01/23/2018  . Chronic pain syndrome 11/28/2017  . Subclinical hypothyroidism 01/29/2017  . Chronic fatigue 01/29/2017  . Vitamin D deficiency 01/29/2017  . Essential hypertension 12/02/2016  . Overweight (BMI 25.0-29.9) 12/02/2016  . Low back pain 12/01/2015  . 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 GAD (generalized anxiety disorder) 06/24/2011  . Migraine 06/24/2011  . Insomnia 06/24/2011    Past Surgical History:  Procedure Laterality Date  . WISDOM TOOTH EXTRACTION         Family History  Problem Relation Age of Onset  . Cancer Mother   .  Hypertension Father   . Stroke Maternal Grandmother   . Alzheimer's disease Maternal Grandmother   . Cancer Maternal Grandfather   . Diabetes Maternal Grandfather   . Cancer Paternal Grandmother   . Heart failure Paternal Grandfather     Social History   Tobacco Use  . Smoking status: Former Games developermoker  . Smokeless tobacco: Never Used  Substance Use Topics  . Alcohol use: No    Alcohol/week: 0.0 standard drinks  . Drug use: No    Home Medications Prior to Admission medications   Medication Sig Start Date End Date Taking? Authorizing Provider  AJOVY 225 MG/1.5ML SOSY  01/20/17   [provider]  buPROPion (WELLBUTRIN SR) 100 MG 12 hr tablet Take 1 tablet (100 mg total) by mouth 2 (two) times daily. PLEASE SEE ATTACHED FOR DETAILED DIRECTIONS 02/14/18   Sherren MochaShaw, Eva N, MD  butalbital-acetaminophen-caffeine (FIORICET WITH CODEINE) [phone] MG capsule Take 1 capsule by mouth every 6 (six) hours as needed for headache or migraine. Do not take more than 6 tabs/d. 04/28/18   Sherren MochaShaw, Eva N, MD  clonazePAM Scarlette Calico(KLONOPIN) 0.5 MG tablet Take 1 tab po qam and midday and 2 tabs po qhs 04/26/18   Sherren MochaShaw, Eva N, MD  cyclobenzaprine (FLEXERIL) 5 MG tablet Take 5 mg by mouth 3 (three) times daily as needed.  10/25/15   Frederico Hammanaffrey, Daniel, MD  levothyroxine (SYNTHROID, LEVOTHROID) 50 MCG tablet Take 1 tablet (50 mcg total) by mouth daily before breakfast. 04/28/18   Sherren MochaShaw, Eva N, MD  Oxycodone HCl 10 MG TABS Take 1 tablet (10 mg total) by mouth at bedtime as needed. 02/14/18   Sherren MochaShaw, Eva N, MD  Oxycodone HCl 10 MG TABS Take 1 tablet (10 mg total) by mouth at bedtime as needed. 03/16/18   Sherren MochaShaw, Eva N, MD  promethazine (PHENERGAN) 25 MG tablet Take 1 tablet (25 mg total) by mouth every 8 (eight) hours as needed for nausea or vomiting (migraine headache). 04/28/18   Sherren MochaShaw, Eva N, MD  propranolol (INDERAL) 20 MG tablet Please specify directions, refills and quantity 05/23/18   Sherren MochaShaw, Eva N, MD  traZODone (DESYREL) 150 MG tablet Take 0.5 tablets (75 mg total) by mouth at bedtime. 04/28/18   Sherren MochaShaw, Eva N, MD    Allergies    Gabapentin, Hydrocodone, Ibuprofen, Nsaids, Other, and Requip [ropinirole hcl]  Review of Systems   Review of Systems  Constitutional: Positive for activity change, appetite change and fatigue. Negative for fever (harder time regulating body temperature).  HENT: Negative for congestion and sore throat.   Eyes: Positive for visual disturbance (over the last week is blurry).  Respiratory: Negative for cough and shortness of breath.   Cardiovascular: Positive for chest pain. Negative for leg swelling.   Gastrointestinal: Positive for nausea (chronic assoc with migraines). Negative for abdominal pain, constipation, diarrhea and vomiting.  Genitourinary: Negative for dysuria.  Musculoskeletal: Positive for back pain.  Skin: Negative for rash.  Neurological: Positive for speech difficulty (over the last week), weakness (this AM, left leg, then right arm holding phone), light-headedness (worse than usual), numbness (left foot/leg, not arm or face) and headaches (chronic migraines). Negative for dizziness, syncope and facial asymmetry.    Physical Exam Updated Vital Signs BP 129/89   Pulse 75   Temp 98.1 F (36.7 C) (Oral)   Resp 15   Ht 5\' 8"  (1.727 m)   Wt 81.6 kg   SpO2 100%   BMI 27.37 kg/m   Physical Exam Vitals and nursing  note reviewed.  Constitutional:      General: He is not in acute distress.    Appearance: He is well-developed. He is not diaphoretic.  HENT:     Head: Normocephalic and atraumatic.  Eyes:     General: No visual field deficit.    Conjunctiva/sclera: Conjunctivae normal.  Cardiovascular:     Rate and Rhythm: Normal rate and regular rhythm.     Heart sounds: Normal heart sounds. No murmur heard. No friction rub. No gallop.   Pulmonary:     Effort: Pulmonary effort is normal. No respiratory distress.     Breath sounds: Normal breath sounds. No wheezing or rales.  Abdominal:     General: There is no distension.     Palpations: Abdomen is soft.     Tenderness: There is no abdominal tenderness. There is no guarding.  Musculoskeletal:     Cervical back: Normal range of motion.  Skin:    General: Skin is warm and dry.  Neurological:     Mental Status: He is alert and oriented to person, place, and time.     GCS: GCS eye subscore is 4. GCS verbal subscore is 5. GCS motor subscore is 6.     Cranial Nerves: Cranial nerves are intact. No cranial nerve deficit, dysarthria or facial asymmetry.     Sensory: Sensory deficit (reports altered sensation in LUE)  present.     Motor: Motor function is intact. No weakness.     Coordination: Coordination is intact. Finger-Nose-Finger Test and Heel to Melville Test normal.     ED Results / Procedures / Treatments   Labs (all labs ordered are listed, but only abnormal results are displayed) Labs Reviewed  CBC WITH DIFFERENTIAL/PLATELET - Abnormal; Notable for the following components:      Result Value   Lymphs Abs 4.2 (*)    All other components within normal limits  COMPREHENSIVE METABOLIC PANEL - Abnormal; Notable for the following components:   Potassium 3.1 (*)    Calcium 8.8 (*)    Total Bilirubin 0.2 (*)    All other components within normal limits  MAGNESIUM  TROPONIN I (HIGH SENSITIVITY)  TROPONIN I (HIGH SENSITIVITY)    EKG EKG Interpretation  Date/Time:  Wednesday May 09 2020 11:38:52 EDT Ventricular Rate:  93 PR Interval:  126 QRS Duration: 88 QT Interval:  364 QTC Calculation: 452 R Axis:   37 Text Interpretation: Normal sinus rhythm Normal ECG No previous ECGs available Confirmed by Alvira Monday ([id number]) on 05/09/2020 12:06:44 PM   Radiology CT Head Wo Contrast  Result Date: 05/09/2020 CLINICAL DATA:  Neuro deficit, suspected stroke. Altered mental status for 1 week. EXAM: CT HEAD WITHOUT CONTRAST TECHNIQUE: Contiguous axial images were obtained from the base of the skull through the vertex without intravenous contrast. COMPARISON:  November of 2017. FINDINGS: Brain: No evidence of acute infarction, hemorrhage, hydrocephalus, extra-axial collection or mass lesion/mass effect. Vascular: No hyperdense vessel or unexpected calcification. Skull: Normal. Negative for fracture or focal lesion. Sinuses/Orbits: Visualized paranasal sinuses and orbits are unremarkable. Other: None. IMPRESSION: No acute intracranial abnormality. Electronically Signed   By: Donzetta Kohut M.D.   On: 05/09/2020 14:51   DG Chest Portable 1 View  Result Date: 05/09/2020 CLINICAL DATA:  Chest pain. EXAM:  PORTABLE CHEST 1 VIEW COMPARISON:  August 19, 2017. FINDINGS: The heart size and mediastinal contours are within normal limits. Both lungs are clear. No pneumothorax or pleural effusion is noted. The visualized skeletal structures are unremarkable. IMPRESSION:  No active disease. Electronically Signed   By: Lupita Raider M.D.   On: 05/09/2020 14:41    Procedures Procedures   Medications Ordered in ED Medications  LORazepam (ATIVAN) injection 0.5 mg (has no administration in time range)  lactated ringers bolus 1,000 mL (0 mLs Intravenous Stopped 05/09/20 1517)  potassium chloride SA (KLOR-CON) CR tablet 40 mEq (40 mEq Oral Given 05/09/20 1534)    ED Course  I have reviewed the triage vital signs and the nursing notes.  Pertinent labs & imaging results that were available during my care of the patient were reviewed by me and considered in my medical decision making (see chart for details).    MDM Rules/Calculators/A&P                         40yo male with history of migraines, chronic back pain, major depressive disorder, generalized anxiety disorder, presents with concern for chest pain for one week, episodes of difficulty speaking, episode of weakness and numbness on left.   DDx includes TIA, stroke, seizure, ACS, dissection, PE, pneumonia, pneumothorax, pericarditis, stress reaction.    Regarding chest pain, An EKG was evaluated by me and showed normal sinus rhythm without acute ST changes consistent with ischemia or pericarditis.  A chest XR was done which showed no mediastinal widening, no pneumonia, no pneumothorax, no pulmonary edema and no cardiac enlargement.  He is PERC negative and have low suspicion for PE.  Troponin negative and he has a low risk HEART score. Normal bilateral upper and lower extremity pulses and given duration of symptoms, history, exam, xr have low suspicion for aortic dissection.   Regarding neurologic symptoms, CT head without acute abnormalities. No  significant electrolyte abnormalities to account for symptoms.  Consulted Tele[facility] for further evaluation of intermittent speech difficulties, altered sensation on left still present on exam, episode of near-fall with left sided weakness.    Final Clinical Impression(s) / ED Diagnoses Final diagnoses:  Chest pain, unspecified type  Difficulty speaking  Left sided numbness    Rx / DC Orders ED Discharge Orders    None       Alvira Monday, MD 05/09/20 1549

## 2020-05-10 ENCOUNTER — Observation Stay (HOSPITAL_COMMUNITY): Payer: 59

## 2020-05-10 ENCOUNTER — Observation Stay (HOSPITAL_BASED_OUTPATIENT_CLINIC_OR_DEPARTMENT_OTHER): Payer: 59

## 2020-05-10 DIAGNOSIS — E876 Hypokalemia: Secondary | ICD-10-CM

## 2020-05-10 DIAGNOSIS — G459 Transient cerebral ischemic attack, unspecified: Secondary | ICD-10-CM

## 2020-05-10 DIAGNOSIS — R079 Chest pain, unspecified: Secondary | ICD-10-CM | POA: Diagnosis not present

## 2020-05-10 DIAGNOSIS — R479 Unspecified speech disturbances: Secondary | ICD-10-CM

## 2020-05-10 DIAGNOSIS — F419 Anxiety disorder, unspecified: Secondary | ICD-10-CM

## 2020-05-10 LAB — ECHOCARDIOGRAM COMPLETE BUBBLE STUDY
AR max vel: 4.49 cm2
AV Area VTI: 5.14 cm2
AV Area mean vel: 5.04 cm2
AV Mean grad: 2 mmHg
AV Peak grad: 4.1 mmHg
Ao pk vel: 1.01 m/s
Area-P 1/2: 4.1 cm2
Calc EF: 72.5 %
S' Lateral: 2.2 cm
Single Plane A2C EF: 78.2 %
Single Plane A4C EF: 65.8 %

## 2020-05-10 LAB — LIPID PANEL
Cholesterol: 211 mg/dL — ABNORMAL HIGH (ref 0–200)
HDL: 43 mg/dL (ref >40)
LDL Cholesterol: 134 mg/dL — ABNORMAL HIGH (ref 0–99)
Total CHOL/HDL Ratio: 4.9 RATIO
Triglycerides: 168 mg/dL — ABNORMAL HIGH (ref <150)
VLDL: 34 mg/dL (ref 0–40)

## 2020-05-10 LAB — POTASSIUM: Potassium: 3.8 mmol/L (ref 3.5–5.1)

## 2020-05-10 LAB — TSH: TSH: 2.602 u[IU]/mL (ref 0.350–4.500)

## 2020-05-10 LAB — HEMOGLOBIN A1C
Hgb A1c MFr Bld: 5.2 % (ref 4.8–5.6)
Mean Plasma Glucose: 102.54 mg/dL

## 2020-05-10 LAB — HIV ANTIBODY (ROUTINE TESTING W REFLEX): HIV Screen 4th Generation wRfx: NONREACTIVE

## 2020-05-10 LAB — D-DIMER, QUANTITATIVE: D-Dimer, Quant: <0.27 ug/mL-FEU (ref 0.00–0.50)

## 2020-05-10 IMAGING — MR MR CERVICAL SPINE WO/W CM
6 of 9 series · 29 of 48 positions shown · IV contrast (Gadavist)
Comparison: Prior head CT from [DATE].

CLINICAL DATA: Initial evaluation for neuro deficit, speech
difficulty, episode of weakness and numbness.

EXAM:
MRI HEAD WITHOUT AND WITH CONTRAST
MRI CERVICAL SPINE WITHOUT AND WITH CONTRAST
TECHNIQUE: Multiplanar, multiecho pulse sequences of the brain and surrounding
structures, and cervical spine, to include the craniocervical
junction and cervicothoracic junction, were obtained without and
with intravenous contrast.
CONTRAST:  8.5mL GADAVIST GADOBUTROL 1 MMOL/ML IV SOLN

[Series 21: T2 · sagittal · 3.0mm · 0.69mm/px · 2 of 15 slices shown (1 of 2)]
[im 1/15]
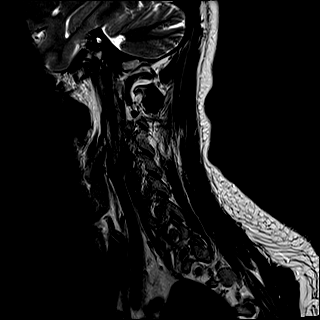
[im 15/15]
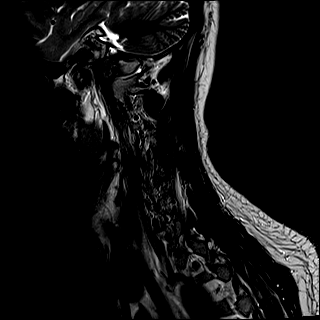

[Series 22: T1 · sagittal · 3.0mm · 0.69mm/px · 2 of 15 slices shown (1 of 2)]
[im 1/15]
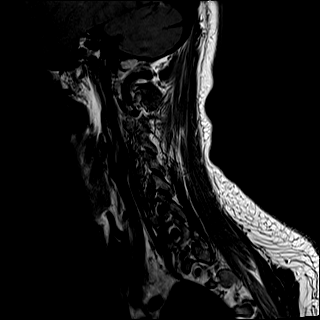
[im 15/15]
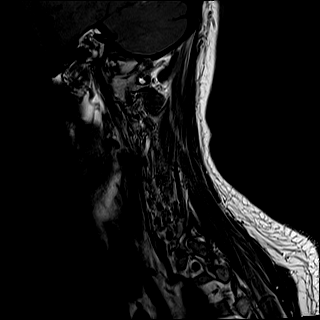

[Series 24: T2 · axial · 3.0mm · 0.66mm/px · z∈[-216,-95]mm · 8 of 40 slices shown (2 of 2)]
[im 1/40]
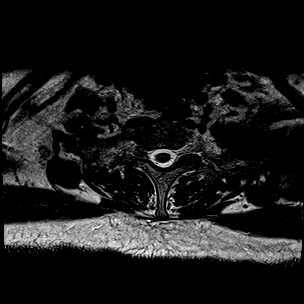
[im 6/40]
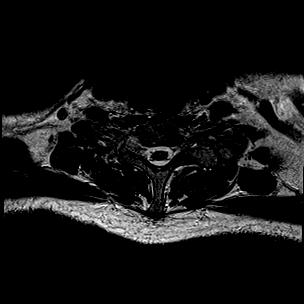
[im 12/40]
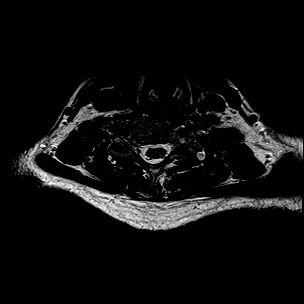
[im 17/40]
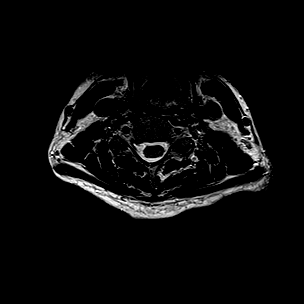
[im 23/40]
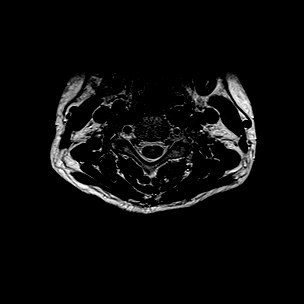
[im 28/40]
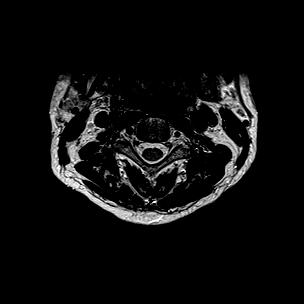
[im 34/40]
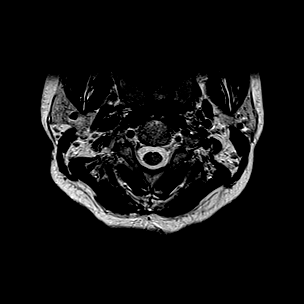
[im 40/40]
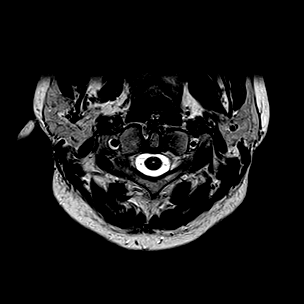

[Series 26: T1 · axial · 3.0mm · 0.39mm/px · z∈[-216,-95]mm · 8 of 40 slices shown (2 of 2)]
[im 1/40]
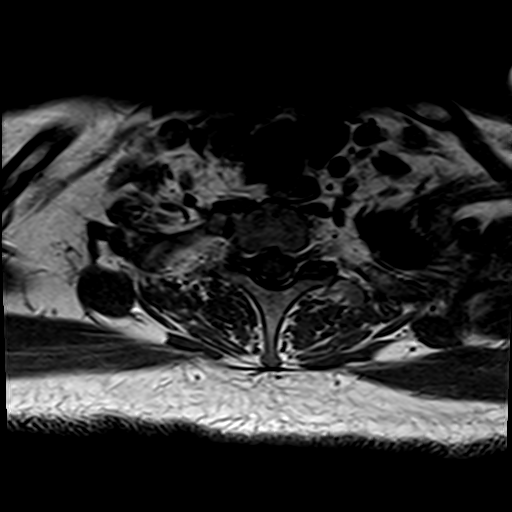
[im 6/40]
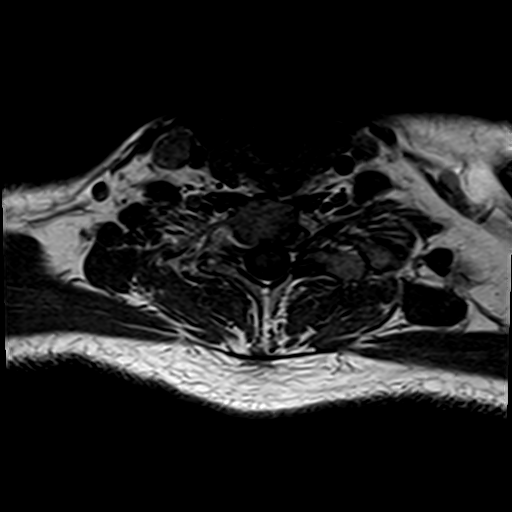
[im 12/40]
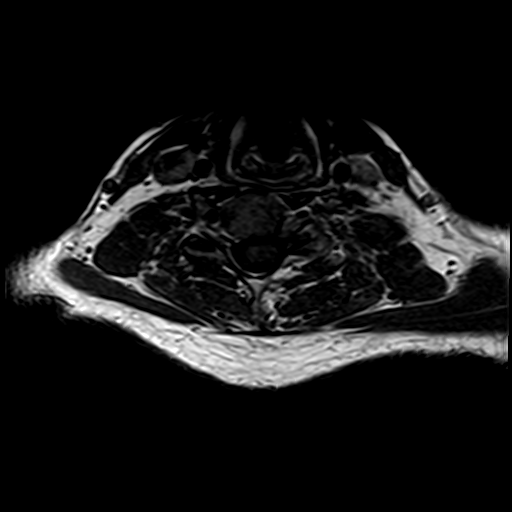
[im 17/40]
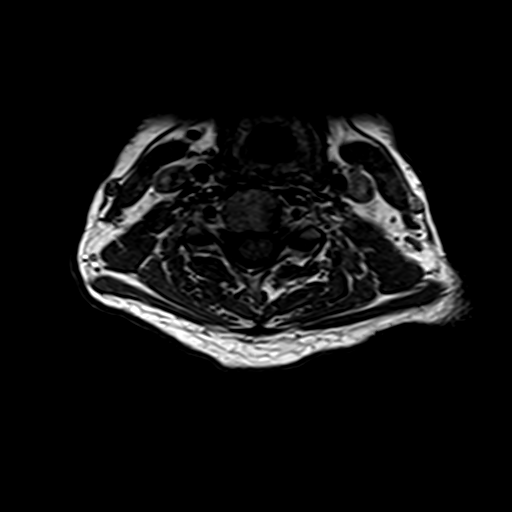
[im 23/40]
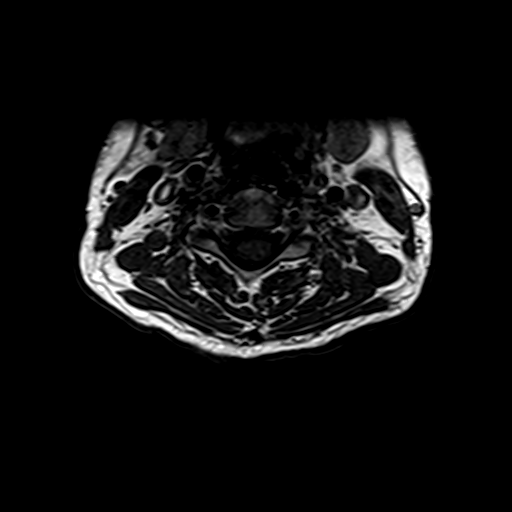
[im 28/40]
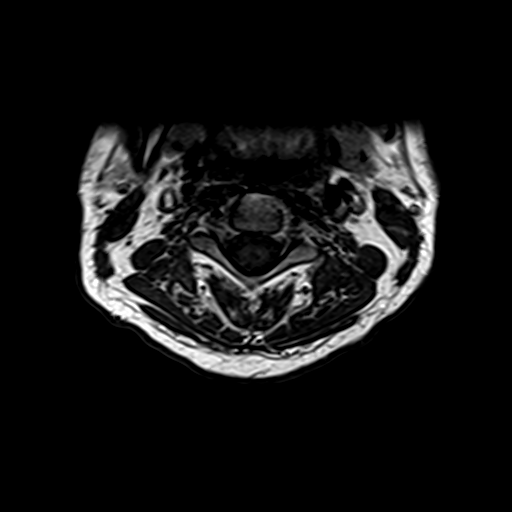
[im 34/40]
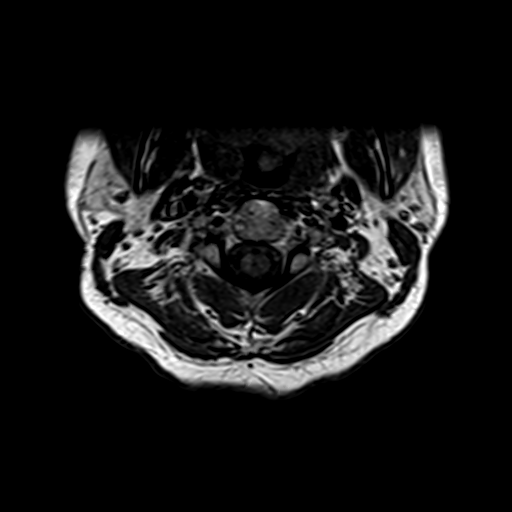
[im 40/40]
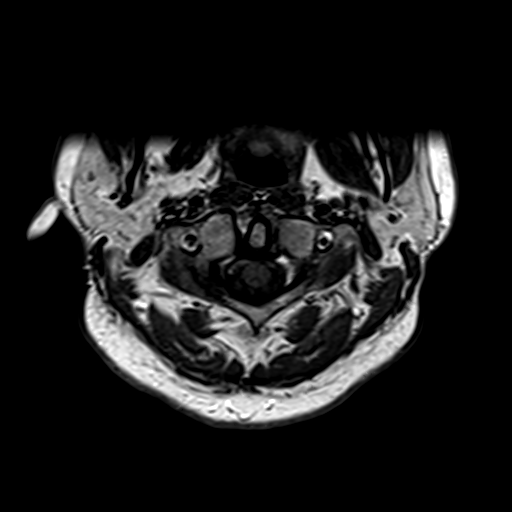

[Series 27: T1 fat-sat post-contrast · sagittal · 3.0mm · 0.43mm/px · 3 of 15 slices shown]
[im 1/15]
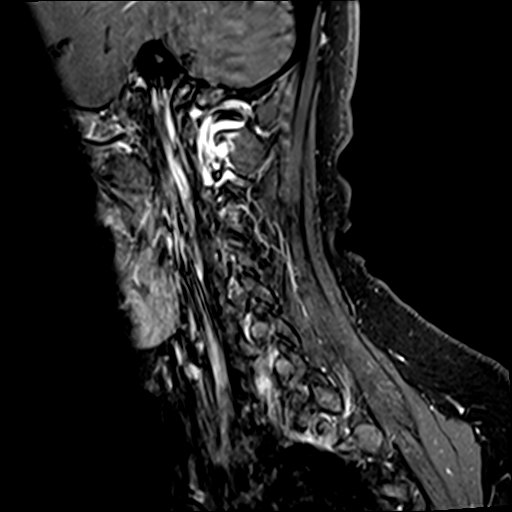
[im 8/15]
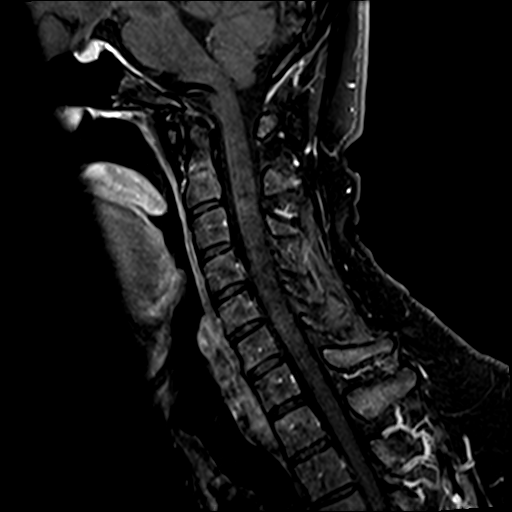
[im 15/15]
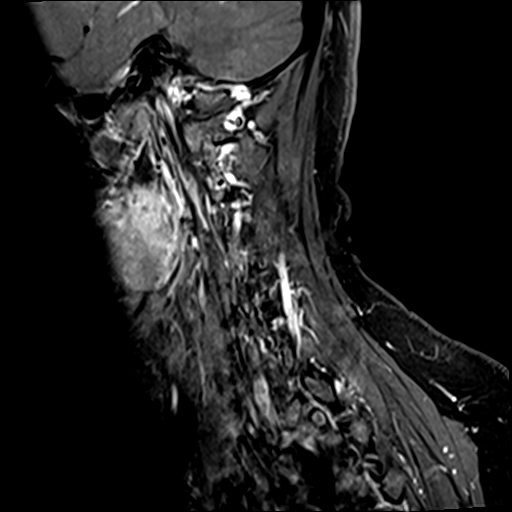

[Series 29: T2 post-contrast · coronal · 5.0mm · 0.72mm/px · 6 of 29 slices shown]
[im 1/29]
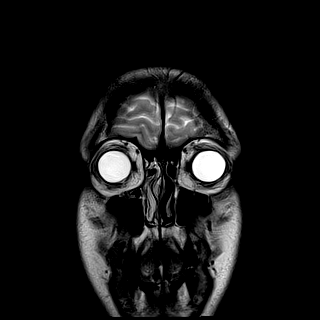
[im 6/29]
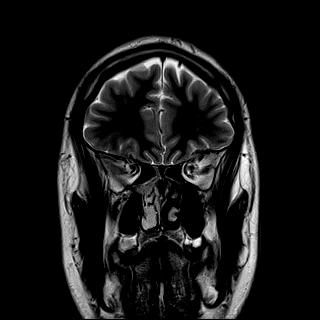
[im 12/29]
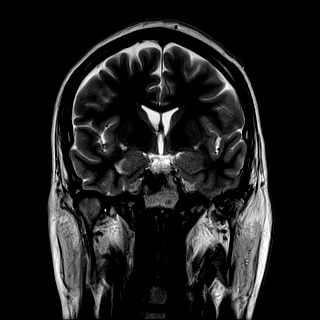
[im 17/29]
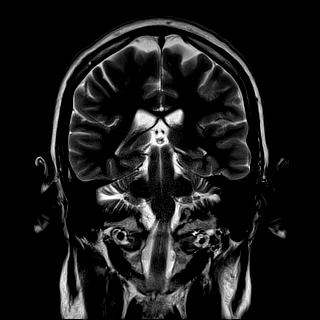
[im 23/29]
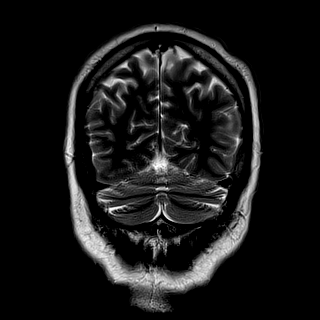
[im 29/29]
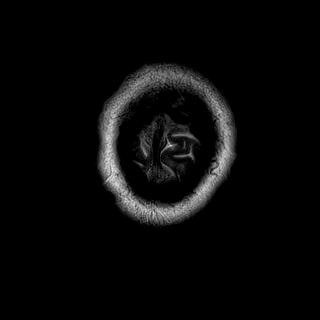

[29 of 48 positions shown; findings below may reference images not displayed]

FINDINGS: MRI HEAD FINDINGS

Brain: Cerebral volume within normal limits for patient age. No
focal parenchymal signal abnormality identified.

No abnormal foci of restricted diffusion to suggest acute or
subacute ischemia. Gray-white matter differentiation well
maintained. No encephalomalacia to suggest chronic infarction. No
foci of susceptibility artifact to suggest acute or chronic
intracranial hemorrhage.

No mass lesion, midline shift or mass effect. No hydrocephalus. No
extra-axial fluid collection. Major dural sinuses are grossly
patent.

Pituitary gland and suprasellar region are normal. Midline
structures intact and normal.

No abnormal enhancement.

Vascular: Major intracranial vascular flow voids well maintained and
normal in appearance.

Skull and upper cervical spine: Craniocervical junction normal.
Visualized upper cervical spine within normal limits. Bone marrow
signal intensity normal. No scalp soft tissue abnormality.

Sinuses/Orbits: Globes and orbital soft tissues within normal
limits.

Mild mucosal thickening noted within the ethmoidal air cells.
Paranasal sinuses are otherwise clear. No significant mastoid
effusion. Inner ear structures grossly normal.

Other: None.

MRI CERVICAL SPINE FINDINGS

Alignment: Mild dextroscoliosis with straightening of the normal
cervical lordosis. No listhesis.

Vertebrae: Vertebral body height maintained without acute or chronic
fracture. Bone marrow signal intensity within normal limits. No
worrisome osseous lesions. No abnormal marrow edema or enhancement.

Cord: Normal signal and morphology.  No abnormal enhancement.

Posterior Fossa, vertebral arteries, paraspinal tissues:
Craniocervical junction within normal limits. Paraspinous and
prevertebral soft tissues within normal limits. Normal flow voids
seen within the vertebral arteries bilaterally.

Disc levels: No significant disc pathology seen within the cervical
spine for age. No significant disc bulge or focal disc herniation.
No significant canal or neural foraminal stenosis or evidence for
neural impingement.
IMPRESSION: 1. Normal brain MRI. No acute intracranial abnormality identified.
2. Normal MRI of the cervical spine and spinal cord. No significant
disc pathology, stenosis, or evidence for neural impingement.

## 2020-05-10 IMAGING — MR MR MRA HEAD W/O CM
1 series · 20 of 48 positions shown · non-contrast
Comparison: None.

CLINICAL DATA: Neuro deficit, acute, stroke suspected

EXAM:
MRA HEAD WITHOUT CONTRAST
TECHNIQUE: Angiographic images of the Circle of Willis were obtained using MRA
technique without intravenous contrast.

[Series 5: 3d cow · axial · 0.5mm · 0.41mm/px · z∈[-104,-17]mm · 20 of 188 slices shown]
[im 1/188]
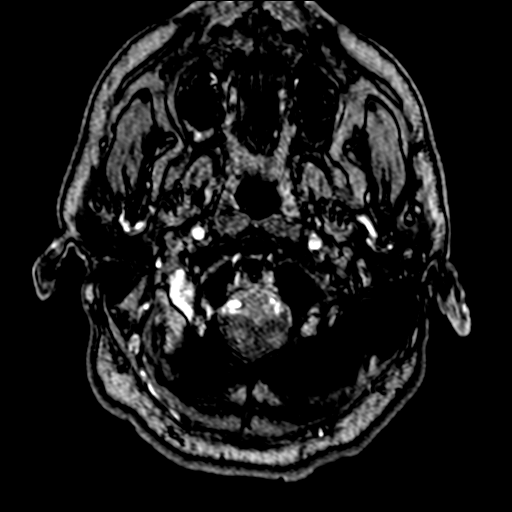
[im 4/188]
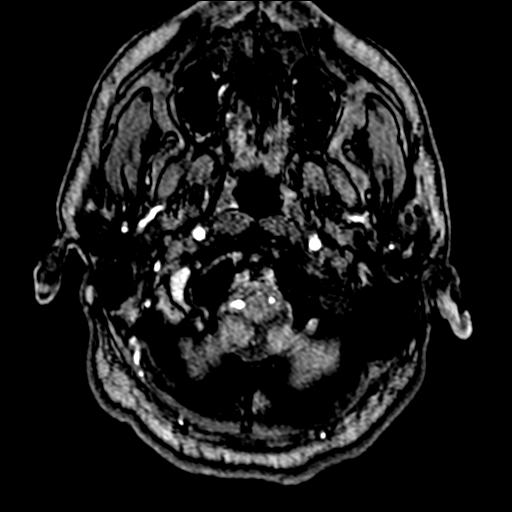
[im 8/188]
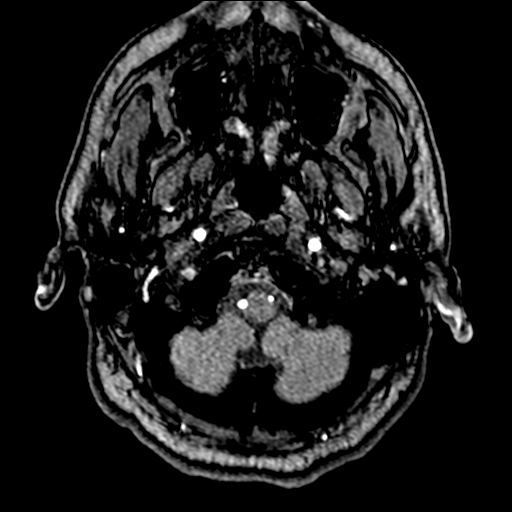
[im 12/188]
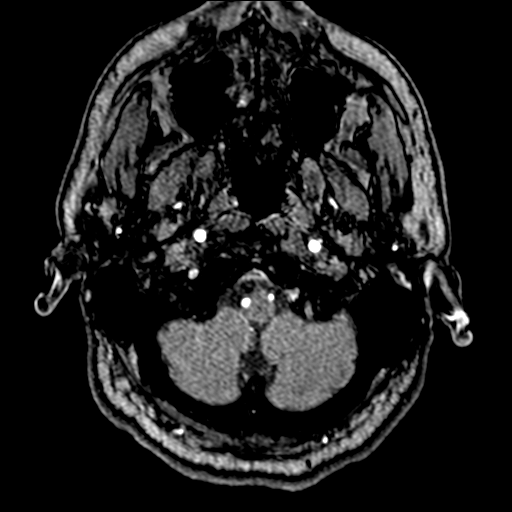
[im 16/188]
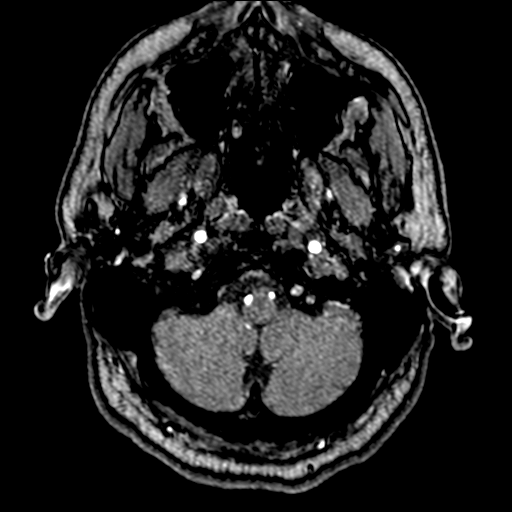
[im 20/188]
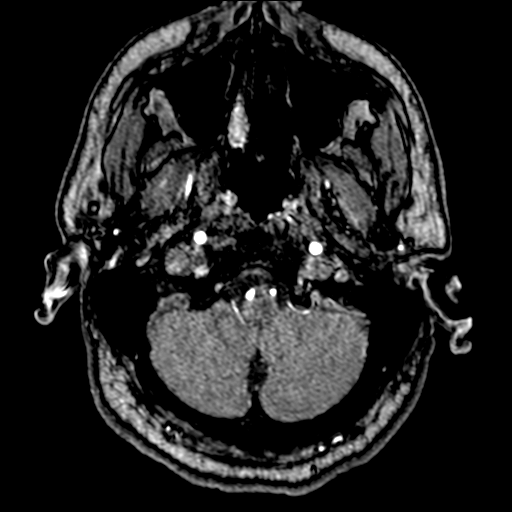
[im 24/188]
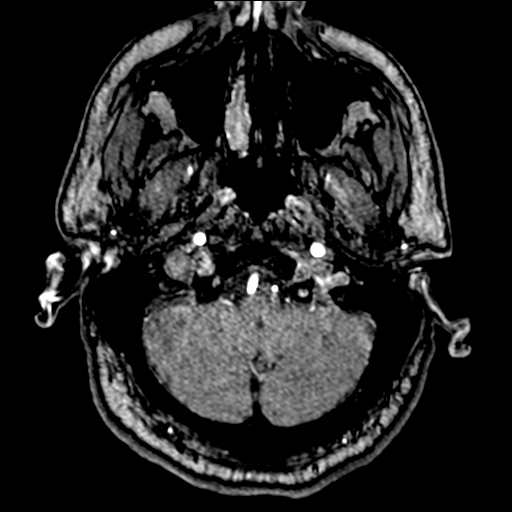
[im 28/188]
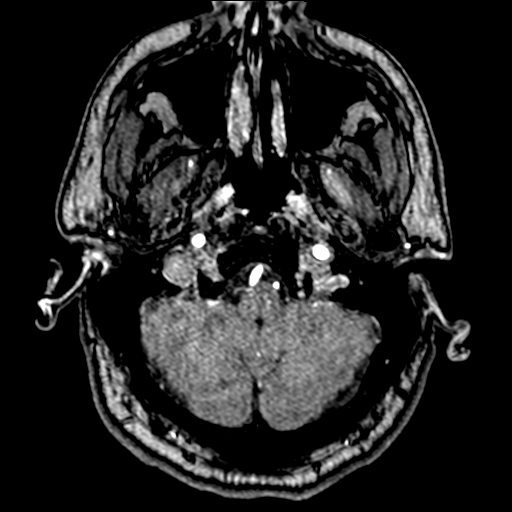
[im 32/188]
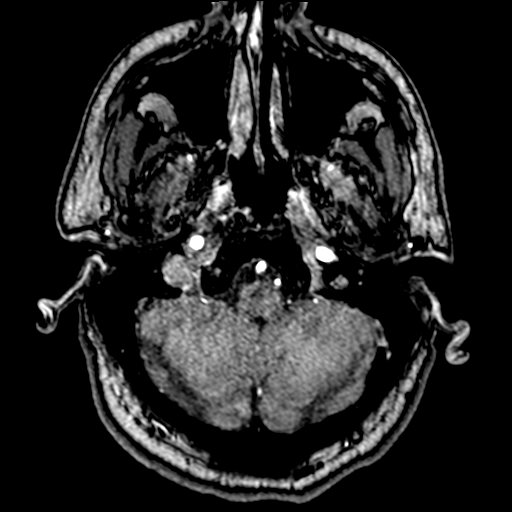
[im 36/188]
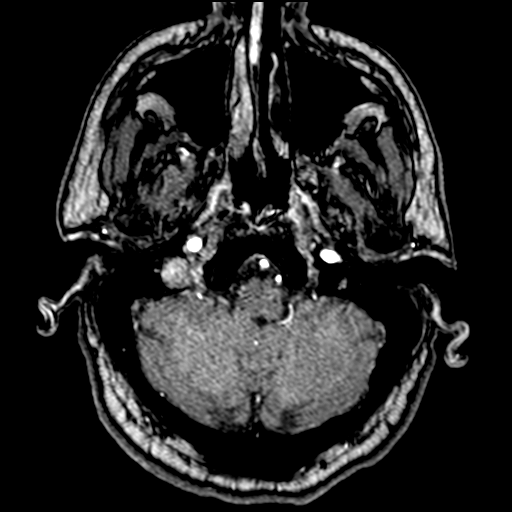
[im 40/188]
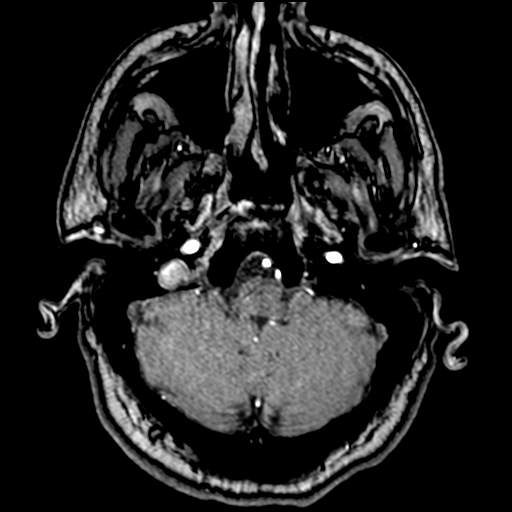
[im 44/188]
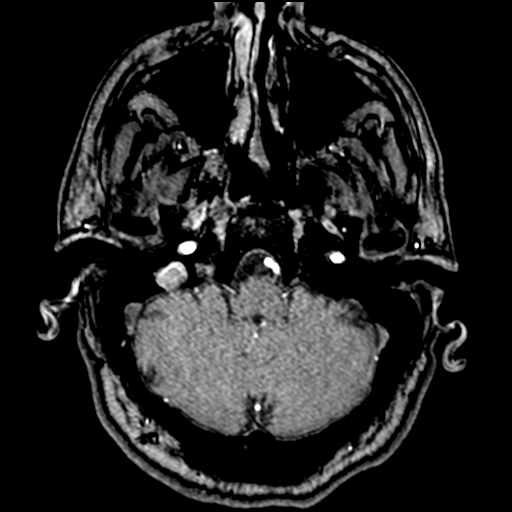
[im 60/188]
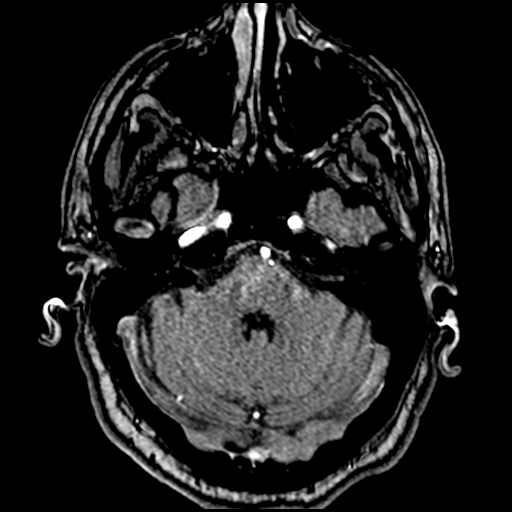
[im 84/188]
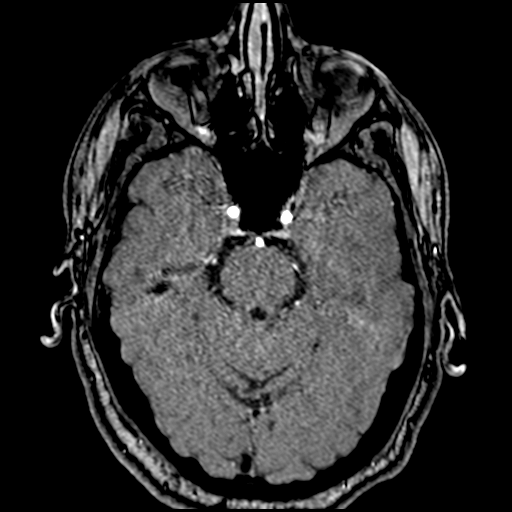
[im 96/188]
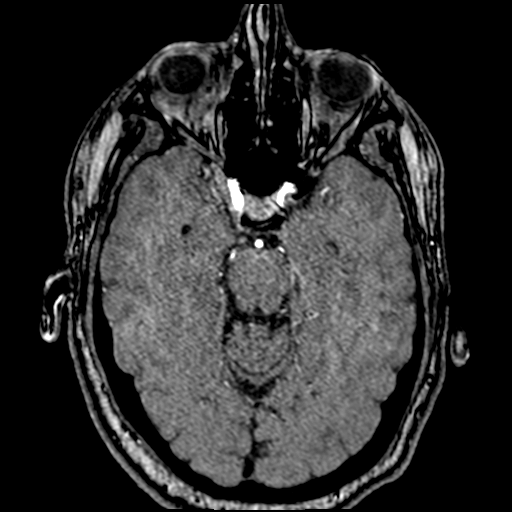
[im 108/188]
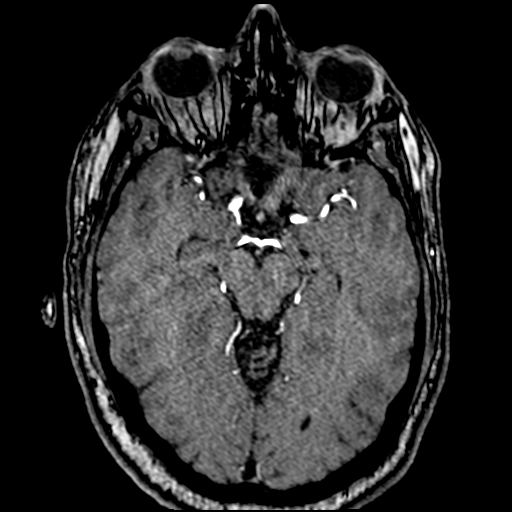
[im 132/188]
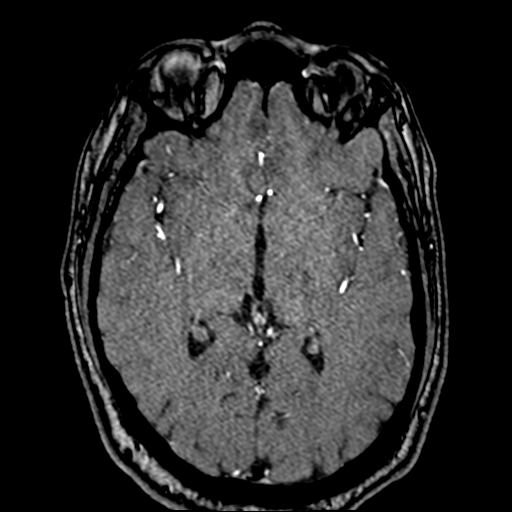
[im 156/188]
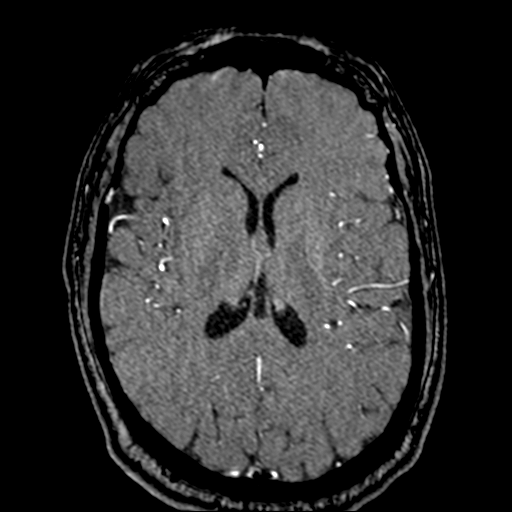
[im 160/188]
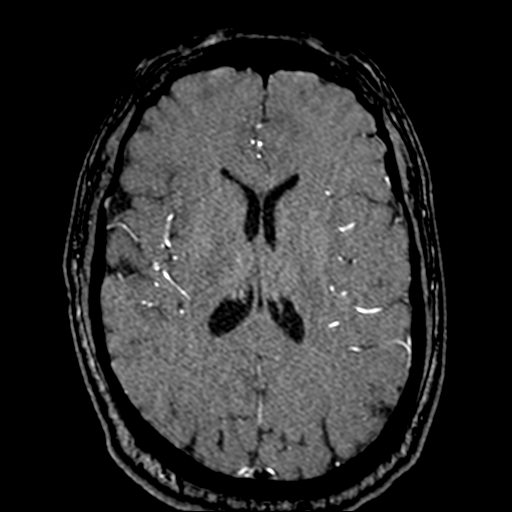
[im 180/188]
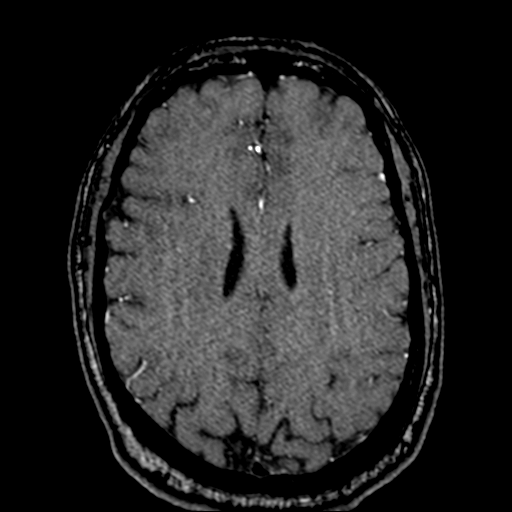

[20 of 48 positions shown; findings below may reference images not displayed]

FINDINGS: The visualized portions of the distal cervical and intracranial
internal carotid arteries are widely patent with normal flow related
enhancement. The bilateral anterior cerebral arteries and middle
cerebral arteries are widely patent with antegrade flow without
high-grade flow-limiting stenosis or proximal branch occlusion. No
intracranial aneurysm within the anterior circulation.

The vertebral arteries are widely patent with antegrade flow.
Vertebrobasilar junction and basilar artery are widely patent with
antegrade flow without evidence of basilar stenosis or aneurysm.
Posterior cerebral arteries are normal bilaterally. No intracranial
aneurysm within the posterior circulation.
IMPRESSION: Normal intracranial MRA.

## 2020-05-10 IMAGING — MR MR HEAD WO/W CM
14 of 16 series · 42 of 48 positions shown · IV contrast (gadavist)
Comparison: Prior head CT from [DATE].

CLINICAL DATA: Initial evaluation for neuro deficit, speech
difficulty, episode of weakness and numbness.

EXAM:
MRI HEAD WITHOUT AND WITH CONTRAST
MRI CERVICAL SPINE WITHOUT AND WITH CONTRAST
TECHNIQUE: Multiplanar, multiecho pulse sequences of the brain and surrounding
structures, and cervical spine, to include the craniocervical
junction and cervicothoracic junction, were obtained without and
with intravenous contrast.
CONTRAST:  8.5mL GADAVIST GADOBUTROL 1 MMOL/ML IV SOLN

[Series 5: DWI · axial · 3.0mm · 0.88mm/px · z∈[-122,+21]mm · 7 of 108 slices shown (1 of 4)]
[im 1/108]
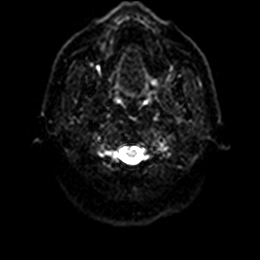
[im 18/108]
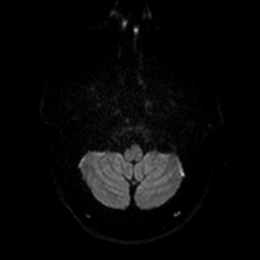
[im 36/108]
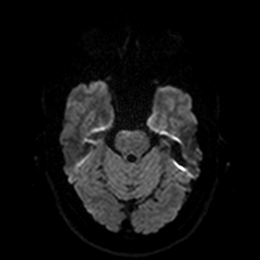
[im 54/108]
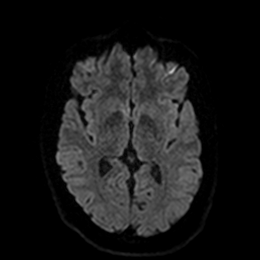
[im 72/108]
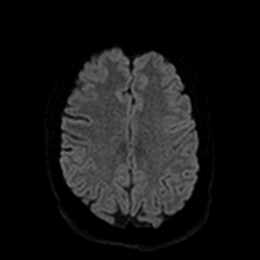
[im 90/108]
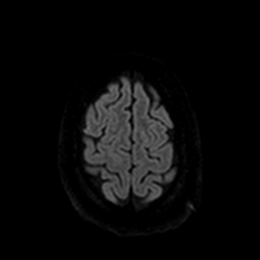
[im 108/108]
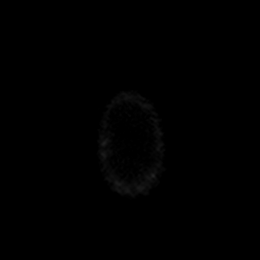

[Series 6: DWI · axial · 3.0mm · 0.88mm/px · z∈[-122,+21]mm · 4 of 54 slices shown (2 of 4)]
[im 1/54]
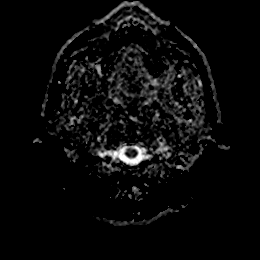
[im 18/54]
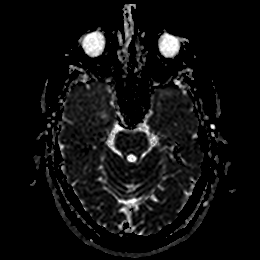
[im 36/54]
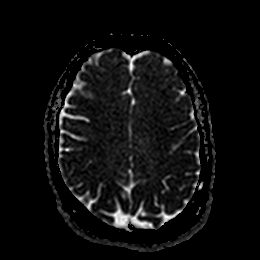
[im 54/54]
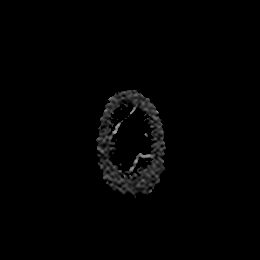

[Series 7: DWI · coronal · 4.0mm · 0.88mm/px · 5 of 72 slices shown (3 of 4)]
[im 1/72]
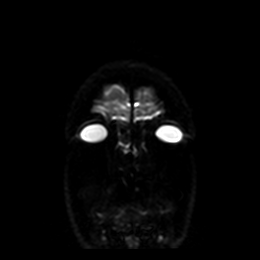
[im 18/72]
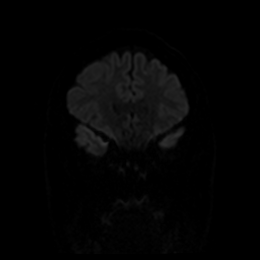
[im 36/72]
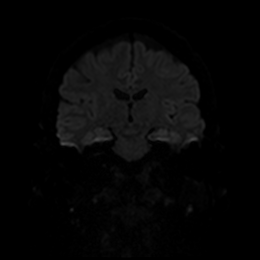
[im 54/72]
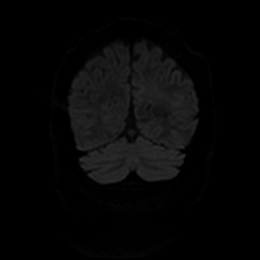
[im 72/72]
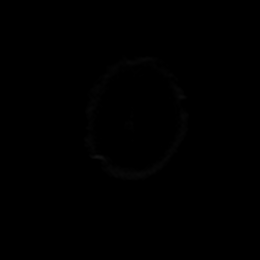

[Series 8: DWI · coronal · 4.0mm · 0.88mm/px · 2 of 36 slices shown (4 of 4)]
[im 1/36]
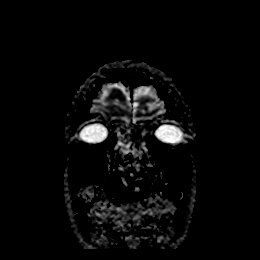
[im 36/36]
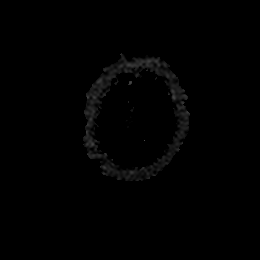

[Series 9: T1 · sagittal · 5.0mm · 0.75mm/px · 2 of 23 slices shown]
[im 1/23]
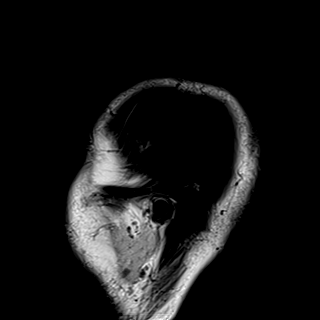
[im 23/23]
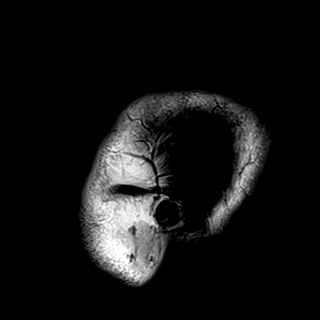

[Series 10: T2 · axial · 5.0mm · 0.72mm/px · z∈[-120,+20]mm · 2 of 27 slices shown]
[im 1/27]
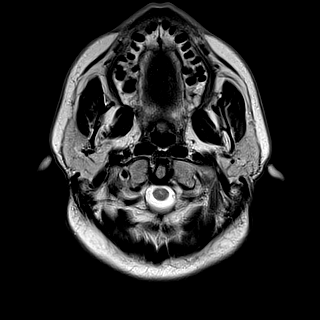
[im 27/27]
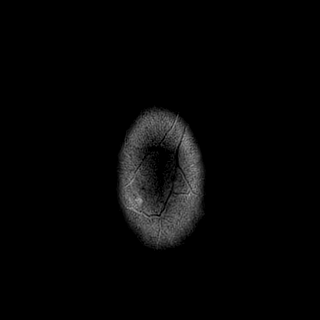

[Series 11: FLAIR · axial · 5.0mm · 0.45mm/px · z∈[-120,+20]mm · 2 of 27 slices shown]
[im 1/27]
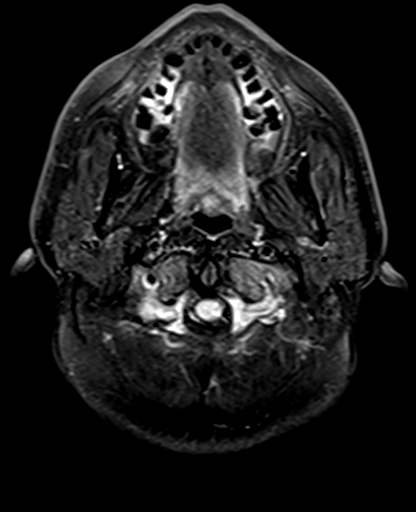
[im 27/27]
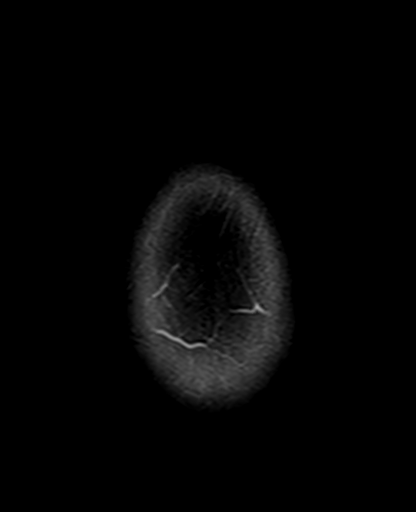

[Series 12: mag_images · axial · 3.0mm · 0.90mm/px · z∈[-119,+19]mm · 3 of 52 slices shown]
[im 1/52]
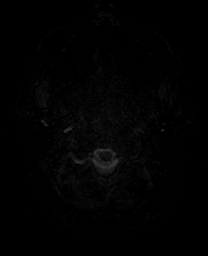
[im 26/52]
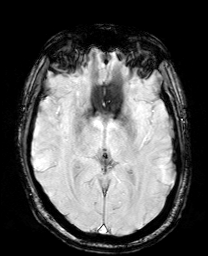
[im 52/52]
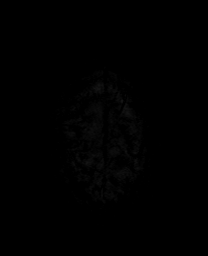

[Series 13: pha_images · axial · 3.0mm · 0.90mm/px · z∈[-119,+19]mm · 3 of 52 slices shown]
[im 1/52]
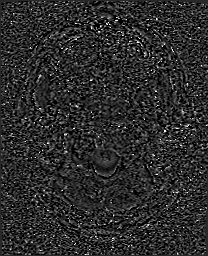
[im 26/52]
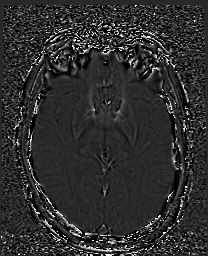
[im 52/52]
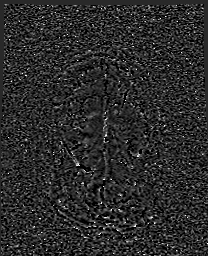

[Series 14: swi_images · axial · 3.0mm · 0.90mm/px · z∈[-119,+19]mm · 3 of 52 slices shown]
[im 1/52]
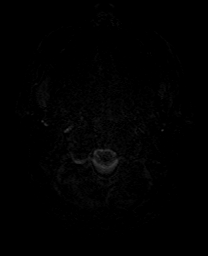
[im 26/52]
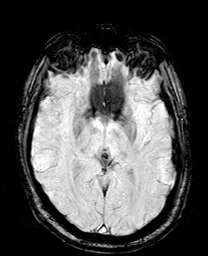
[im 52/52]
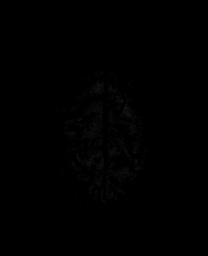

[Series 15: mip_images(sw) · axial · 24.0mm · 0.90mm/px · z∈[-109,+10]mm · 3 of 45 slices shown]
[im 1/45]
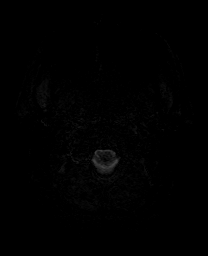
[im 23/45]
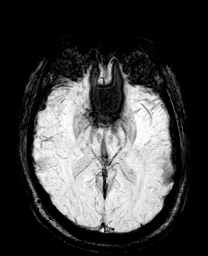
[im 45/45]
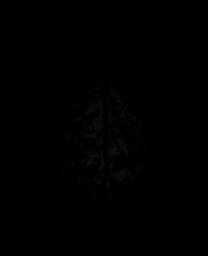

[Series 29: T2 post-contrast · coronal · 5.0mm · 0.72mm/px · 2 of 29 slices shown]
[im 1/29]
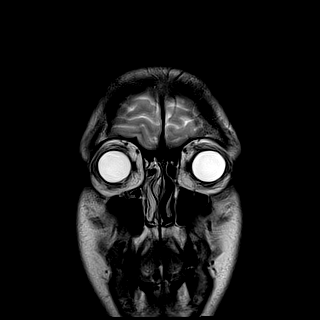
[im 29/29]
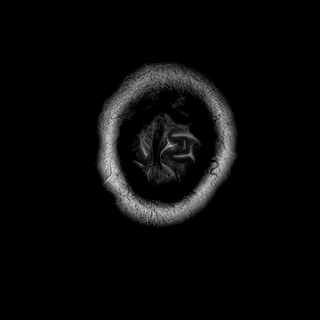

[Series 31: T1 post-contrast · coronal · 5.0mm · 0.34mm/px · 2 of 29 slices shown (1 of 2)]
[im 1/29]
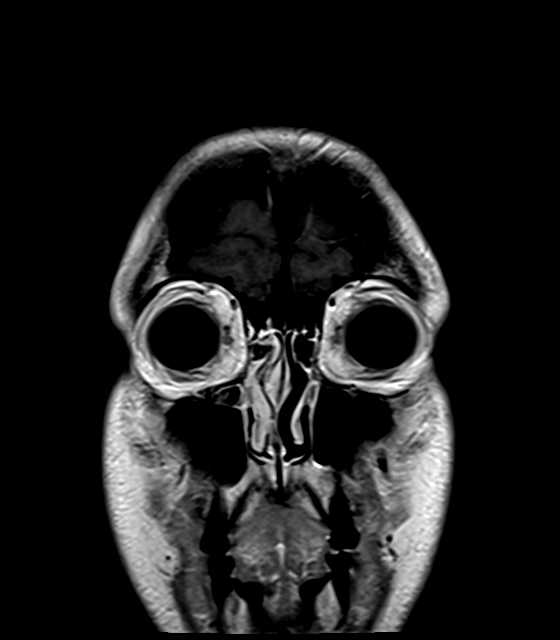
[im 29/29]
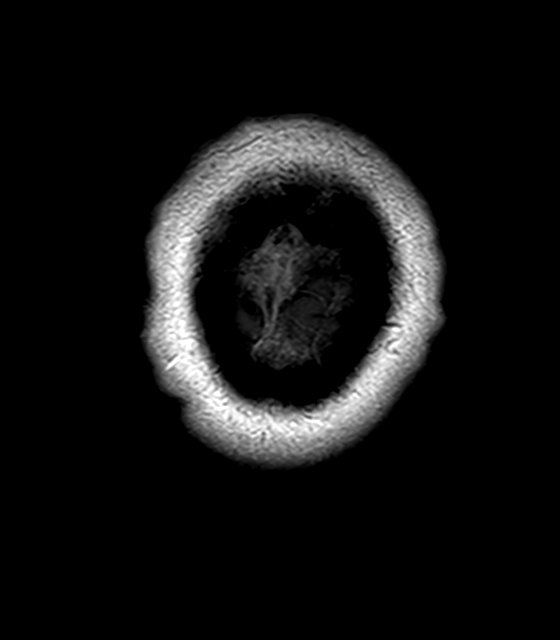

[Series 32: T1 post-contrast · sagittal · 5.0mm · 0.72mm/px · 2 of 23 slices shown (2 of 2)]
[im 1/23]
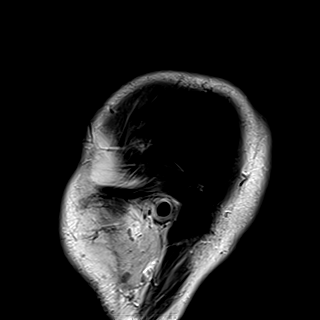
[im 23/23]
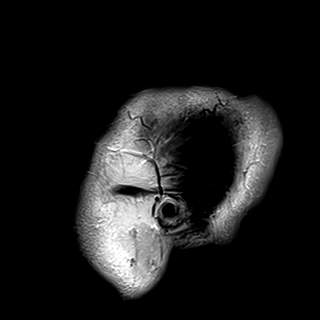

[42 of 48 positions shown; findings below may reference images not displayed]

FINDINGS: MRI HEAD FINDINGS

Brain: Cerebral volume within normal limits for patient age. No
focal parenchymal signal abnormality identified.

No abnormal foci of restricted diffusion to suggest acute or
subacute ischemia. Gray-white matter differentiation well
maintained. No encephalomalacia to suggest chronic infarction. No
foci of susceptibility artifact to suggest acute or chronic
intracranial hemorrhage.

No mass lesion, midline shift or mass effect. No hydrocephalus. No
extra-axial fluid collection. Major dural sinuses are grossly
patent.

Pituitary gland and suprasellar region are normal. Midline
structures intact and normal.

No abnormal enhancement.

Vascular: Major intracranial vascular flow voids well maintained and
normal in appearance.

Skull and upper cervical spine: Craniocervical junction normal.
Visualized upper cervical spine within normal limits. Bone marrow
signal intensity normal. No scalp soft tissue abnormality.

Sinuses/Orbits: Globes and orbital soft tissues within normal
limits.

Mild mucosal thickening noted within the ethmoidal air cells.
Paranasal sinuses are otherwise clear. No significant mastoid
effusion. Inner ear structures grossly normal.

Other: None.

MRI CERVICAL SPINE FINDINGS

Alignment: Mild dextroscoliosis with straightening of the normal
cervical lordosis. No listhesis.

Vertebrae: Vertebral body height maintained without acute or chronic
fracture. Bone marrow signal intensity within normal limits. No
worrisome osseous lesions. No abnormal marrow edema or enhancement.

Cord: Normal signal and morphology.  No abnormal enhancement.

Posterior Fossa, vertebral arteries, paraspinal tissues:
Craniocervical junction within normal limits. Paraspinous and
prevertebral soft tissues within normal limits. Normal flow voids
seen within the vertebral arteries bilaterally.

Disc levels: No significant disc pathology seen within the cervical
spine for age. No significant disc bulge or focal disc herniation.
No significant canal or neural foraminal stenosis or evidence for
neural impingement.
IMPRESSION: 1. Normal brain MRI. No acute intracranial abnormality identified.
2. Normal MRI of the cervical spine and spinal cord. No significant
disc pathology, stenosis, or evidence for neural impingement.

## 2020-05-10 MED ORDER — SODIUM CHLORIDE 0.9 % IV SOLN: INTRAVENOUS | Status: DC

## 2020-05-10 MED ORDER — STROKE: EARLY STAGES OF RECOVERY BOOK
Freq: Once | Status: AC
  Filled 2020-05-10: qty 1

## 2020-05-10 MED ORDER — BUTALBITAL-APAP-CAFFEINE 50-325-40 MG PO TABS
1.0000 | ORAL_TABLET | Freq: Four times a day (QID) | ORAL | Status: DC | PRN
  Administered 2020-05-10: 1 via ORAL
  Filled 2020-05-10: qty 1

## 2020-05-10 MED ORDER — CYCLOBENZAPRINE HCL 10 MG PO TABS: 10.0000 mg | ORAL_TABLET | Freq: Three times a day (TID) | ORAL | Status: DC | PRN

## 2020-05-10 MED ORDER — ACETAMINOPHEN 160 MG/5ML PO SOLN
650.0000 mg | ORAL | Status: DC | PRN
Start: 2020-05-10 — End: 2020-05-10

## 2020-05-10 MED ORDER — LORAZEPAM 2 MG/ML IJ SOLN
1.0000 mg | Freq: Once | INTRAMUSCULAR | Status: AC
  Administered 2020-05-10: 1 mg via INTRAVENOUS
  Filled 2020-05-10: qty 1

## 2020-05-10 MED ORDER — ACETAMINOPHEN 325 MG PO TABS: 650.0000 mg | ORAL_TABLET | ORAL | Status: DC | PRN

## 2020-05-10 MED ORDER — LORAZEPAM 2 MG/ML IJ SOLN
2.0000 mg | Freq: Once | INTRAMUSCULAR | Status: AC
  Administered 2020-05-10: 2 mg via INTRAVENOUS
  Filled 2020-05-10: qty 1

## 2020-05-10 MED ORDER — LEVOTHYROXINE SODIUM 50 MCG PO TABS: 50.0000 ug | ORAL_TABLET | Freq: Every day | ORAL | Status: DC

## 2020-05-10 MED ORDER — ASPIRIN EC 81 MG PO TBEC
81.0000 mg | DELAYED_RELEASE_TABLET | Freq: Every day | ORAL | Status: DC
  Filled 2020-05-10: qty 1

## 2020-05-10 MED ORDER — BUTALBITAL-APAP-CAFF-COD 50-325-40-30 MG PO CAPS: 1.0000 | ORAL_CAPSULE | Freq: Four times a day (QID) | ORAL | Status: DC | PRN

## 2020-05-10 MED ORDER — ACETAMINOPHEN 650 MG RE SUPP: 650.0000 mg | RECTAL | Status: DC | PRN

## 2020-05-10 MED ORDER — GADOBUTROL 1 MMOL/ML IV SOLN
8.5000 mL | Freq: Once | INTRAVENOUS | Status: AC | PRN
  Administered 2020-05-10: 8.5 mL via INTRAVENOUS

## 2020-05-10 MED ORDER — ENOXAPARIN SODIUM 40 MG/0.4ML ~~LOC~~ SOLN
40.0000 mg | SUBCUTANEOUS | Status: DC
  Filled 2020-05-10: qty 0.4

## 2020-05-10 MED ORDER — CODEINE SULFATE 15 MG PO TABS
30.0000 mg | ORAL_TABLET | Freq: Four times a day (QID) | ORAL | Status: DC | PRN
  Administered 2020-05-10: 30 mg via ORAL
  Filled 2020-05-10: qty 2

## 2020-05-10 NOTE — Progress Notes (Signed)
PT Cancellation Note  Patient Details Name: Dalton Kelly MRN: [id number] DOB: Jun 27, 1980   Cancelled Treatment:    Reason Eval/Treat Not Completed: PT screened, no needs identified, will sign off   Bevelyn Buckles 05/10/2020, 2:01 PM Sheliah Fiorillo M,PT Acute Rehab Services [phone] (pager)

## 2020-05-10 NOTE — Consult Note (Signed)
NEUROLOGY CONSULTATION NOTE   Date of service: May 10, 2020 Patient Name: Dalton Kelly MRN:  [id number] DOB:  [birth year] Reason for consult: "TIA" _ _ _   _ __   _ __ _ _  __ __   _ __   __ _  History of Present Illness  Dalton Kelly is a 40 y.o. male with PMH significant for generalized anxiety disorder, insomnia, migraines who was seen by teleneurology in the ED at [facility] for language issues, left sided weakness and numbness and chest pain.  Reports 1 week of pain across his chest, has not slept in few days. Sudden onset language issues, mispelled several words while texting and incoherent speech with left sided numbness and weakness involing arm and leg. Symtom onset at 0700 on 05/09/20. Was seen in the ED but symptoms self resolving. Lasted about 3-4 hours and no symptoms right now.  Seen by teleneurology. NIHSS of 0. Workup with CTH with no acute abnormality. He was admitted to [facility] for concern for TIA vs MS and further workup with MRI Brain and C spine with and without contrast. MRI was negative for acute stroke, no T2/FLAIR lesions concerning for MS.   ROS   Constitutional Denies weight loss, fever and chills.   HEENT Denies changes in vision and hearing.   Respiratory Denies SOB and cough.   CV Denies palpitations and CP   GI Denies abdominal pain, nausea, vomiting and diarrhea.   GU Denies dysuria and urinary frequency.   MSK Denies myalgia and joint pain.   Skin Denies rash and pruritus.   Neurological Denies headache and syncope.   Psychiatric Denies recent changes in mood. Denies anxiety and depression.    Past History   Past Medical History:  Diagnosis Date  . Anxiety   . Back pain   . Barbiturate dependence (HCC)   . Episode of syncope    Recent that sounds vasovagal  . Fatigue   . Generalized anxiety disorder   . Hair loss   . Insomnia   . Irregular bowel habits   . Migraines   . Opioid dependence in controlled environment (HCC)    . Systolic click    Past Surgical History:  Procedure Laterality Date  . WISDOM TOOTH EXTRACTION     Family History  Problem Relation Age of Onset  . Cancer Mother   . Hypertension Father   . Stroke Maternal Grandmother   . Alzheimer's disease Maternal Grandmother   . Cancer Maternal Grandfather   . Diabetes Maternal Grandfather   . Cancer Paternal Grandmother   . Heart failure Paternal Grandfather    Social History   Socioeconomic History  . Marital status: Married    Spouse name: [name]  . Number of children: Not on file  . Years of education: Not on file  . Highest education level: Not on file  Occupational History  . Occupation: therapist    Comment: [employer]  Tobacco Use  . Smoking status: Former Games developer  . Smokeless tobacco: Never Used  Substance and Sexual Activity  . Alcohol use: No    Alcohol/week: 0.0 standard drinks  . Drug use: No  . Sexual activity: Not on file  Other Topics Concern  . 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[employer]    Married - [name]   Social Determinants of Corporate investment banker Strain: Not on [employer] Insecurity:  Not on file  Transportation Needs: Not on file  Physical Activity: Not on file  Stress: Not on file  Social Connections: Not on file   Allergies  Allergen Reactions  . Gabapentin Other (See Comments)    unk  . Hydrocodone Other (See Comments)    unk  . Ibuprofen     Abdominal pain  . Nsaids Nausea And Vomiting  . Other   . Requip [Ropinirole Hcl] Other (See Comments)    Severe abdominal pain    Medications   Medications Prior to Admission  Medication Sig Dispense Refill Last Dose  . butalbital-acetaminophen-caffeine (FIORICET WITH CODEINE) 50-325-40-30 MG capsule Take 1 capsule by mouth every 6 (six) hours as needed for headache or migraine. Do not take more than 6 tabs/d. 120 capsule 5 05/09/2020 at Unknown time  . clonazePAM (KLONOPIN) 0.5 MG tablet Take 1 tab  po qam and midday and 2 tabs po qhs 120 tablet 5 05/09/2020 at Unknown time  . cyclobenzaprine (FLEXERIL) 5 MG tablet Take 10 mg by mouth 3 (three) times daily as needed.   05/09/2020 at Unknown time  . levothyroxine (SYNTHROID, LEVOTHROID) 50 MCG tablet Take 1 tablet (50 mcg total) by mouth daily before breakfast. 90 tablet 1 05/08/2020 at Unknown time  . ondansetron (ZOFRAN-ODT) 8 MG disintegrating tablet Take 8 mg by mouth every 8 (eight) hours as needed for nausea.   unk  . traZODone (DESYREL) 150 MG tablet Take 0.5 tablets (75 mg total) by mouth at bedtime. 45 tablet 1 05/08/2020 at Unknown time  . AJOVY 225 MG/1.5ML SOSY      . buPROPion (WELLBUTRIN SR) 100 MG 12 hr tablet Take 1 tablet (100 mg total) by mouth 2 (two) times daily. PLEASE SEE ATTACHED FOR DETAILED DIRECTIONS 180 tablet 1   . Oxycodone HCl 10 MG TABS Take 1 tablet (10 mg total) by mouth at bedtime as needed. (Patient taking differently: Take 10 mg by mouth 4 (four) times daily.) 30 tablet 0 Unknown at Unknown time  . promethazine (PHENERGAN) 25 MG tablet Take 1 tablet (25 mg total) by mouth every 8 (eight) hours as needed for nausea or vomiting (migraine headache). 60 tablet 5   . propranolol (INDERAL) 20 MG tablet Please specify directions, refills and quantity 90 tablet 0      Vitals   Vitals:   05/09/20 2000 05/09/20 2100 05/09/20 2200 05/09/20 2300  BP: 135/90 123/78 114/76 113/74  Pulse: 96 79 74 66  Resp: 11 18 16 18   Temp:      TempSrc:      SpO2: 100% 98% 96% 95%  Weight:      Height:         Body mass index is 27.37 kg/m.  Physical Exam   General: Laying comfortably in bed; in no acute distress.  HENT: Normal oropharynx and mucosa. Normal external appearance of ears and nose.  Neck: Supple, no pain or tenderness  CV: No JVD. No peripheral edema.  Pulmonary: Symmetric Chest rise. Normal respiratory effort.  Abdomen: Soft to touch, non-tender.  Ext: No cyanosis, edema, or deformity  Skin: No rash. Normal  palpation of skin.   Musculoskeletal: Normal digits and nails by inspection. No clubbing.   Neurologic Examination  Mental status/Cognition: Alert, oriented to self, place, month and year, good attention.  Speech/language: Fluent, comprehension intact, object naming intact, repetition intact.  Cranial nerves:   CN II Pupils equal and reactive to light, no VF deficits    CN III,IV,VI EOM  intact, no gaze preference or deviation, no nystagmus    CN V normal sensation in V1, V2, and V3 segments bilaterally    CN VII no asymmetry, no nasolabial fold flattening    CN VIII normal hearing to speech    CN IX & X normal palatal elevation, no uvular deviation    CN XI 5/5 head turn and 5/5 shoulder shrug bilaterally    CN XII midline tongue protrusion    Motor:  Muscle bulk: normal, tone normal, pronator drift none tremor none Mvmt Root Nerve  Muscle Right Left Comments  SA C5/6 Ax Deltoid 5 5   EF C5/6 Mc Biceps 5 5   EE C6/7/8 Rad Triceps 5 5   WF C6/7 Med FCR     WE C7/8 PIN ECU     F Ab C8/T1 U ADM/FDI 5 5   HF L1/2/3 Fem Illopsoas 4 4 Did not examine on patient's request due to pain  KE L2/3/4 Fem Quad 5 5   DF L4/5 D Peron Tib Ant 5 5   PF S1/2 Tibial Grc/Sol 5 5    Reflexes:  Right Left Comments  Pectoralis      Biceps (C5/6) 2 2   Brachioradialis (C5/6) 2 2    Triceps (C6/7) 2 2    Patellar (L3/4) 2 2    Achilles (S1) 1 1    Hoffman      Plantar     Jaw jerk    Sensation:  Light touch intact   Pin prick    Temperature    Vibration   Proprioception    Coordination/Complex Motor:  - Finger to Nose intact - Heel to shin intact - Rapid alternating movement normal - Gait: deferred.  Labs   CBC:  Recent Labs  Lab 05/09/20 1158  WBC 8.9  NEUTROABS 3.5  HGB 15.1  HCT 43.6  MCV 91.4  PLT 327    Basic Metabolic Panel:  Lab Results  Component Value Date   NA 136 05/09/2020   K 3.1 (L) 05/09/2020   CO2 27 05/09/2020   GLUCOSE 95 05/09/2020   BUN 11  05/09/2020   CREATININE 0.87 05/09/2020   CALCIUM 8.8 (L) 05/09/2020   GFRNONAA >60 05/09/2020   GFRAA 126 08/19/2017   Lipid Panel:  Lab Results  Component Value Date   LDLCALC 124 (H) 01/26/2017   HgbA1c: No results found for: HGBA1C Urine Drug Screen: No results found for: LABOPIA, COCAINSCRNUR, LABBENZ, AMPHETMU, THCU, LABBARB  Alcohol Level No results found for: ETH  CT Head without contrast: CTH was negative for a large hypodensity concerning for a large territory infarct or hyperdensity concerning for an ICH  MRI Brain, C spine with and without contrast: No acute abnormalities.  Impression   Claudean KindsWilliam F Pflaum is a 40 y.o. male with PMH significant for generalized anxiety disorder, insomnia, migraines who presents with sudden onset language issues, mispelled several words while texting and incoherent speech along with left sided weakness and numbness. His neurologic examination is normal ith no focal deficit. Suspect the episode was a TIA with abrupt onset and resolution in 3-4 hours.  Recommendations  Plan:  Recommend that primary team order following: - Frequent Neuro checks per stroke unit protocol - Recommend Vascular imaging with MRA Angio Head without contrast and US Carotid doppler - Recommend obtaining TTE - Recommend obtaining Lipid panel with LDL - Please start statin if LDL > 70 - Recommend HbA1c - Antithrombotic - aspirin 81mg  daily or plavix  75mg  daily if okay with patient. He declined aspirin 81mg . - Recommend DVT ppx - SBP goal - permissive hypertension first 24 h < 220/110. Hold home meds.  - Recommend Telemetry monitoring for arrythmia - Stroke education booklet - Recommend PT/OT/SLP consult - Recommend Urine Tox screen. ______________________________________________________________________   Thank you for the opportunity to take part in the care of this patient. If you have any further questions, please contact the neurology consultation  attending.  Signed,  [facility] Pager Number _ _ _   _ __   _ __ _ _  __ __   _ __   __ _

## 2020-05-10 NOTE — Discharge Summary (Signed)
Physician Discharge Summary  Dalton Kelly ZOX:[id number] DOB: Mar 03, 1980 DOA: 05/09/2020  PCP: Caffie Damme, MD  Admit date: 05/09/2020 Discharge date: 05/10/2020  Admitted From: home Disposition:  home  Recommendations for Outpatient Follow-up:  1. Follow up with PCP in 1-2 weeks  Home Health: none Equipment/Devices: none  Discharge Condition: stable CODE STATUS: Full code Diet recommendation: regular  HPI: Per admitting MD, Dr Ulyess Blossom, Dalton Kelly is a 40 y.o. male with medical history significant of depression, anxiety, chronic back pain, migraine headaches, hypothyroidism presented to med [facility] ED with complaints of chest pain, difficulty speaking, and left-sided weakness/numbness.  History provided by patient limited.  States he has had pain all across his chest for a week and also sharp pain in the middle of his chest.  States today he had difficulty speaking and left-sided weakness/numbness.  States he was not able to walk at home and has not slept in days.  States his symptoms have now resolved and he is able to walk.  He is requesting a medication for anxiety.  No additional history could be obtained from him at this time.  Hospital Course / Discharge diagnoses: Principal problem Left-sided weakness/numbness-patient was admitted to the hospital with concern for CVA or MS given symptoms.  He underwent a comprehensive work-up with an MRI of the brain with and without contrast which was normal, C-spine MRI normal as well and an MR angiogram without significant occlusions and read as normal.  Hemoglobin A1c 5.2.  LDL 134.  Carotid Doppler without significant obstructions.  2D echo showed normal EF without WMA, normal RV.  He has clinically returned to baseline, will be discharged home in stable condition.  Neurology followed patient while hospitalized, discussed with Dr. Pearlean Brownie on the day of discharge  Active problems Chest pain-he was ruled out for an MI, his  high-sensitivity troponins were negative x2, EKG was nonischemic, his D-dimer was negative ruling out a PE. Mild hypokalemia -repleted, potassium normalized.  Magnesium normal Anxiety -Continue home medications Chronic back pain -Continue home medications Chronic migraine headaches -follow up as an outpatient Hypothyroidism -Continue home Synthroid  Sepsis ruled out  Discharge Instructions   Allergies as of 05/10/2020      Reactions   Gabapentin Other (See Comments)   unk   Hydrocodone Other (See Comments)   unk   Ibuprofen    Abdominal pain   Nsaids Nausea And Vomiting   Other    Requip [ropinirole Hcl] Other (See Comments)   Severe abdominal pain      Medication List    STOP taking these medications   Ajovy 225 MG/1.5ML Sosy Generic drug: Fremanezumab-vfrm   buPROPion 100 MG 12 hr tablet Commonly known as: WELLBUTRIN SR   promethazine 25 MG tablet Commonly known as: PHENERGAN   propranolol 20 MG tablet Commonly known as: INDERAL     TAKE these medications   butalbital-acetaminophen-caffeine 50-325-40-30 MG capsule Commonly known as: FIORICET WITH CODEINE Take 1 capsule by mouth every 6 (six) hours as needed for headache or migraine. Do not take more than 6 tabs/d.   clonazePAM 0.5 MG tablet Commonly known as: KLONOPIN Take 1 tab po qam and midday and 2 tabs po qhs   cyclobenzaprine 5 MG tablet Commonly known as: FLEXERIL Take 10 mg by mouth 3 (three) times daily as needed.   levothyroxine 50 MCG tablet Commonly known as: SYNTHROID Take 1 tablet (50 mcg total) by mouth daily before breakfast.   ondansetron 8 MG disintegrating  tablet Commonly known as: ZOFRAN-ODT Take 8 mg by mouth every 8 (eight) hours as needed for nausea.   Oxycodone HCl 10 MG Tabs Take 1 tablet (10 mg total) by mouth at bedtime as needed. What changed: when to take this   traZODone 150 MG tablet Commonly known as: DESYREL Take 0.5 tablets (75 mg total) by mouth at bedtime.        Follow-up Information    Caffie Damme, MD Follow up in 2 week(s).   Specialty: Family Medicine Contact information: [address] [place] [address] [phone]               Consultations:  Neurology  Procedures/Studies:  CT Head Wo Contrast  Result Date: 05/09/2020 CLINICAL DATA:  Neuro deficit, suspected stroke. Altered mental status for 1 week. EXAM: CT HEAD WITHOUT CONTRAST TECHNIQUE: Contiguous axial images were obtained from the base of the skull through the vertex without intravenous contrast. COMPARISON:  November of 2017. FINDINGS: Brain: No evidence of acute infarction, hemorrhage, hydrocephalus, extra-axial collection or mass lesion/mass effect. Vascular: No hyperdense vessel or unexpected calcification. Skull: Normal. Negative for fracture or focal lesion. Sinuses/Orbits: Visualized paranasal sinuses and orbits are unremarkable. Other: None. IMPRESSION: No acute intracranial abnormality. Electronically Signed   By: Donzetta Kohut M.D.   On: 05/09/2020 14:51   MR ANGIO HEAD WO CONTRAST  Result Date: 05/10/2020 CLINICAL DATA:  Neuro deficit, acute, stroke suspected EXAM: MRA HEAD WITHOUT CONTRAST TECHNIQUE: Angiographic images of the Circle of Willis were obtained using MRA technique without intravenous contrast. COMPARISON:  None. FINDINGS: The visualized portions of the distal cervical and intracranial internal carotid arteries are widely patent with normal flow related enhancement. The bilateral anterior cerebral arteries and middle cerebral arteries are widely patent with antegrade flow without high-grade flow-limiting stenosis or proximal branch occlusion. No intracranial aneurysm within the anterior circulation. The vertebral arteries are widely patent with antegrade flow. Vertebrobasilar junction and basilar artery are widely patent with antegrade flow without evidence of basilar stenosis or aneurysm. Posterior cerebral arteries are normal bilaterally. No  intracranial aneurysm within the posterior circulation. IMPRESSION: Normal intracranial MRA. Electronically Signed   By: Baldemar Lenis M.D.   On: 05/10/2020 08:32   MR Brain W and Wo Contrast  Result Date: 05/10/2020 CLINICAL DATA:  Initial evaluation for neuro deficit, speech difficulty, episode of weakness and numbness. EXAM: MRI HEAD WITHOUT AND WITH CONTRAST MRI CERVICAL SPINE WITHOUT AND WITH CONTRAST TECHNIQUE: Multiplanar, multiecho pulse sequences of the brain and surrounding structures, and cervical spine, to include the craniocervical junction and cervicothoracic junction, were obtained without and with intravenous contrast. CONTRAST:  8.20mL GADAVIST GADOBUTROL 1 MMOL/ML IV SOLN COMPARISON:  Prior head CT from 05/09/2020. FINDINGS: MRI HEAD FINDINGS Brain: Cerebral volume within normal limits for patient age. No focal parenchymal signal abnormality identified. No abnormal foci of restricted diffusion to suggest acute or subacute ischemia. Gray-white matter differentiation well maintained. No encephalomalacia to suggest chronic infarction. No foci of susceptibility artifact to suggest acute or chronic intracranial hemorrhage. No mass lesion, midline shift or mass effect. No hydrocephalus. No extra-axial fluid collection. Major dural sinuses are grossly patent. Pituitary gland and suprasellar region are normal. Midline structures intact and normal. No abnormal enhancement. Vascular: Major intracranial vascular flow voids well maintained and normal in appearance. Skull and upper cervical spine: Craniocervical junction normal. Visualized upper cervical spine within normal limits. Bone marrow signal intensity normal. No scalp soft tissue abnormality. Sinuses/Orbits: Globes and orbital soft tissues within  normal limits. Mild mucosal thickening noted within the ethmoidal air cells. Paranasal sinuses are otherwise clear. No significant mastoid effusion. Inner ear structures grossly normal.  Other: None. MRI CERVICAL SPINE FINDINGS Alignment: Mild dextroscoliosis with straightening of the normal cervical lordosis. No listhesis. Vertebrae: Vertebral body height maintained without acute or chronic fracture. Bone marrow signal intensity within normal limits. No worrisome osseous lesions. No abnormal marrow edema or enhancement. Cord: Normal signal and morphology.  No abnormal enhancement. Posterior Fossa, vertebral arteries, paraspinal tissues: Craniocervical junction within normal limits. Paraspinous and prevertebral soft tissues within normal limits. Normal flow voids seen within the vertebral arteries bilaterally. Disc levels: No significant disc pathology seen within the cervical spine for age. No significant disc bulge or focal disc herniation. No significant canal or neural foraminal stenosis or evidence for neural impingement. IMPRESSION: 1. Normal brain MRI. No acute intracranial abnormality identified. 2. Normal MRI of the cervical spine and spinal cord. No significant disc pathology, stenosis, or evidence for neural impingement. Electronically Signed   By: Rise Mu M.D.   On: 05/10/2020 03:49   MR Cervical Spine W or Wo Contrast  Result Date: 05/10/2020 CLINICAL DATA:  Initial evaluation for neuro deficit, speech difficulty, episode of weakness and numbness. EXAM: MRI HEAD WITHOUT AND WITH CONTRAST MRI CERVICAL SPINE WITHOUT AND WITH CONTRAST TECHNIQUE: Multiplanar, multiecho pulse sequences of the brain and surrounding structures, and cervical spine, to include the craniocervical junction and cervicothoracic junction, were obtained without and with intravenous contrast. CONTRAST:  8.63mL GADAVIST GADOBUTROL 1 MMOL/ML IV SOLN COMPARISON:  Prior head CT from 05/09/2020. FINDINGS: MRI HEAD FINDINGS Brain: Cerebral volume within normal limits for patient age. No focal parenchymal signal abnormality identified. No abnormal foci of restricted diffusion to suggest acute or subacute  ischemia. Gray-white matter differentiation well maintained. No encephalomalacia to suggest chronic infarction. No foci of susceptibility artifact to suggest acute or chronic intracranial hemorrhage. No mass lesion, midline shift or mass effect. No hydrocephalus. No extra-axial fluid collection. Major dural sinuses are grossly patent. Pituitary gland and suprasellar region are normal. Midline structures intact and normal. No abnormal enhancement. Vascular: Major intracranial vascular flow voids well maintained and normal in appearance. Skull and upper cervical spine: Craniocervical junction normal. Visualized upper cervical spine within normal limits. Bone marrow signal intensity normal. No scalp soft tissue abnormality. Sinuses/Orbits: Globes and orbital soft tissues within normal limits. Mild mucosal thickening noted within the ethmoidal air cells. Paranasal sinuses are otherwise clear. No significant mastoid effusion. Inner ear structures grossly normal. Other: None. MRI CERVICAL SPINE FINDINGS Alignment: Mild dextroscoliosis with straightening of the normal cervical lordosis. No listhesis. Vertebrae: Vertebral body height maintained without acute or chronic fracture. Bone marrow signal intensity within normal limits. No worrisome osseous lesions. No abnormal marrow edema or enhancement. Cord: Normal signal and morphology.  No abnormal enhancement. Posterior Fossa, vertebral arteries, paraspinal tissues: Craniocervical junction within normal limits. Paraspinous and prevertebral soft tissues within normal limits. Normal flow voids seen within the vertebral arteries bilaterally. Disc levels: No significant disc pathology seen within the cervical spine for age. No significant disc bulge or focal disc herniation. No significant canal or neural foraminal stenosis or evidence for neural impingement. IMPRESSION: 1. Normal brain MRI. No acute intracranial abnormality identified. 2. Normal MRI of the cervical spine and  spinal cord. No significant disc pathology, stenosis, or evidence for neural impingement. Electronically Signed   By: Rise Mu M.D.   On: 05/10/2020 03:49   DG Chest Portable 1 View  Result  Date: 05/09/2020 CLINICAL DATA:  Chest pain. EXAM: PORTABLE CHEST 1 VIEW COMPARISON:  August 19, 2017. FINDINGS: The heart size and mediastinal contours are within normal limits. Both lungs are clear. No pneumothorax or pleural effusion is noted. The visualized skeletal structures are unremarkable. IMPRESSION: No active disease. Electronically Signed   By: Lupita RaiderJames  Green Jr M.D.   On: 05/09/2020 14:41   ECHOCARDIOGRAM COMPLETE BUBBLE STUDY  Result Date: 05/10/2020    ECHOCARDIOGRAM REPORT   Patient Name:   Dalton Kelly Date of Exam: 05/10/2020 Medical Rec #:  [id number]        Height:       68.0 in Accession #:    [id number]       Weight:       180.0 lb Date of Birth:  20-Nov-1980        BSA:          1.954 m Patient Age:    39 years         BP:           125/94 mmHg Patient Gender: M                HR:           74 bpm. Exam Location:  Inpatient Procedure: 2D Echo, Cardiac Doppler, Color Doppler and Saline Contrast Bubble            Study Indications:    TIA  History:        Patient has no prior history of Echocardiogram examinations.                 TIA, Signs/Symptoms:Chest Pain; Risk Factors:Hypertension.  Sonographer:    Neomia DearAMARA CROWN RDCS Referring Phys: [id number] [facility] IMPRESSIONS  1. Left ventricular ejection fraction, by estimation, is 65 to 70%. The left ventricle has normal function. The left ventricle has no regional wall motion abnormalities. Left ventricular diastolic parameters were normal. The average left ventricular global longitudinal strain is -19.9 %.  2. Right ventricular systolic function is normal. The right ventricular size is normal.  3. Bubble contrast study was negative for Right to left shunting at the atrial level.  4. The mitral valve is normal in structure. Trivial mitral  valve regurgitation. No evidence of mitral stenosis.  5. The aortic valve is normal in structure. Aortic valve regurgitation is not visualized. No aortic stenosis is present. FINDINGS  Left Ventricle: Left ventricular ejection fraction, by estimation, is 65 to 70%. The left ventricle has normal function. The left ventricle has no regional wall motion abnormalities. The average left ventricular global longitudinal strain is -19.9 %. The left ventricular internal cavity size was normal in size. There is no left ventricular hypertrophy. Left ventricular diastolic parameters were normal. Right Ventricle: The right ventricular size is normal. No increase in right ventricular wall thickness. Right ventricular systolic function is normal. Left Atrium: Left atrial size was normal in size. Right Atrium: Right atrial size was normal in size. Pericardium: There is no evidence of pericardial effusion. Mitral Valve: The mitral valve is normal in structure. Trivial mitral valve regurgitation. No evidence of mitral valve stenosis. Tricuspid Valve: The tricuspid valve is normal in structure. Tricuspid valve regurgitation is trivial. Aortic Valve: The aortic valve is normal in structure. Aortic valve regurgitation is not visualized. No aortic stenosis is present. Aortic valve mean gradient measures 2.0 mmHg. Aortic valve peak gradient measures 4.1 mmHg. Aortic valve area, by VTI measures 5.14 cm. Pulmonic Valve: The pulmonic  valve was grossly normal. Pulmonic valve regurgitation is not visualized. No evidence of pulmonic stenosis. Aorta: The aortic root and ascending aorta are structurally normal, with no evidence of dilitation. IAS/Shunts: No atrial level shunt detected by color flow Doppler. Agitated saline contrast was given intravenously to evaluate for intracardiac shunting.  LEFT VENTRICLE PLAX 2D LVIDd:         4.30 cm     Diastology LVIDs:         2.20 cm     LV e' medial:    8.81 cm/s LV PW:         1.00 cm     LV E/e'  medial:  7.7 LV IVS:        0.80 cm     LV e' lateral:   9.46 cm/s LVOT diam:     2.60 cm     LV E/e' lateral: 7.2 LV SV:         99 LV SV Index:   51          2D Longitudinal Strain LVOT Area:     5.31 cm    2D Strain GLS Avg:     -19.9 %  LV Volumes (MOD) LV vol d, MOD A2C: 64.1 ml LV vol d, MOD A4C: 71.4 ml LV vol s, MOD A2C: 14.0 ml LV vol s, MOD A4C: 24.4 ml LV SV MOD A2C:     50.1 ml LV SV MOD A4C:     71.4 ml LV SV MOD BP:      49.1 ml RIGHT VENTRICLE RV S prime:     14.40 cm/s  PULMONARY VEINS TAPSE (M-mode): 2.6 cm      A Reversal Duration: 79.00 msec                             A Reversal Velocity: 23.00 cm/s                             Diastolic Velocity:  29.80 cm/s                             S/D Velocity:        1.50                             Systolic Velocity:   45.00 cm/s LEFT ATRIUM           Index       RIGHT ATRIUM           Index LA diam:      2.10 cm 1.07 cm/m  RA Area:     15.50 cm LA Vol (A4C): 36.2 ml 18.52 ml/m RA Volume:   43.20 ml  22.11 ml/m  AORTIC VALVE                   PULMONIC VALVE AV Area (Vmax):    4.49 cm    PV Vmax:       0.96 m/s AV Area (Vmean):   5.04 cm    PV Vmean:      73.700 cm/s AV Area (VTI):     5.14 cm    PV VTI:        0.219 m AV Vmax:  101.00 cm/s PV Peak grad:  3.7 mmHg AV Vmean:          66.100 cm/s PV Mean grad:  2.0 mmHg AV VTI:            0.192 m AV Peak Grad:      4.1 mmHg AV Mean Grad:      2.0 mmHg LVOT Vmax:         85.50 cm/s LVOT Vmean:        62.800 cm/s LVOT VTI:          0.186 m LVOT/AV VTI ratio: 0.97  AORTA Ao Root diam: 3.70 cm Ao Asc diam:  3.30 cm MITRAL VALVE MV Area (PHT): 4.10 cm    SHUNTS MV Decel Time: 185 msec    Systemic VTI:  0.19 m MV E velocity: 68.10 cm/s  Systemic Diam: 2.60 cm MV A velocity: 47.60 cm/s MV E/A ratio:  1.43 Kristeen Miss MD Electronically signed by Kristeen Miss MD Signature Date/Time: 05/10/2020/10:54:55 AM    Final    VAS US CAROTID  Result Date: 05/10/2020 Carotid Arterial Duplex Study  Indications:       TIA, Speech disturbance, Numbness and Weakness. Comparison Study:  No prior study Performing Technologist: Sherren Kerns RVS  Examination Guidelines: A complete evaluation includes B-mode imaging, spectral Doppler, color Doppler, and power Doppler as needed of all accessible portions of each vessel. Bilateral testing is considered an integral part of a complete examination. Limited examinations for reoccurring indications may be performed as noted.  Right Carotid Findings: +----------+--------+--------+--------+------------------+------------------+           PSV cm/sEDV cm/sStenosisPlaque DescriptionComments           +----------+--------+--------+--------+------------------+------------------+ CCA Prox  140     30                                intimal thickening +----------+--------+--------+--------+------------------+------------------+ CCA Distal123     41                                intimal thickening +----------+--------+--------+--------+------------------+------------------+ ICA Prox  71      28              heterogenous                         +----------+--------+--------+--------+------------------+------------------+ ICA Distal84      31                                                   +----------+--------+--------+--------+------------------+------------------+ ECA       117     25                                                   +----------+--------+--------+--------+------------------+------------------+ +----------+--------+-------+--------+-------------------+           PSV cm/sEDV cmsDescribeArm Pressure (mmHG) +----------+--------+-------+--------+-------------------+ [id number]                                        +----------+--------+-------+--------+-------------------+ +---------+--------+--+--------+--+  VertebralPSV cm/s55EDV cm/s22 +---------+--------+--+--------+--+  Left Carotid Findings:  +----------+--------+--------+--------+------------------+------------------+           PSV cm/sEDV cm/sStenosisPlaque DescriptionComments           +----------+--------+--------+--------+------------------+------------------+ CCA Prox  131     31                                intimal thickening +----------+--------+--------+--------+------------------+------------------+ CCA Distal118     33              calcific                             +----------+--------+--------+--------+------------------+------------------+ ICA Prox  86      26              heterogenous                         +----------+--------+--------+--------+------------------+------------------+ ICA Distal65      29                                                   +----------+--------+--------+--------+------------------+------------------+ ECA       116     21                                                   +----------+--------+--------+--------+------------------+------------------+ +----------+--------+--------+--------+-------------------+           PSV cm/sEDV cm/sDescribeArm Pressure (mmHG) +----------+--------+--------+--------+-------------------+ [id number]                                         +----------+--------+--------+--------+-------------------+ +---------+--------+--+--------+--+ VertebralPSV cm/s38EDV cm/s11 +---------+--------+--+--------+--+   Summary: Right Carotid: The extracranial vessels were near-normal with only minimal wall                thickening or plaque. Left Carotid: The extracranial vessels were near-normal with only minimal wall               thickening or plaque. Vertebrals:  Bilateral vertebral arteries demonstrate antegrade flow. Subclavians: Normal flow hemodynamics were seen in bilateral subclavian              arteries. *See table(s) above for measurements and observations.  Electronically signed by Delia Heady MD on 05/10/2020 at  1:04:35 PM.    Final       Subjective: - no chest pain, shortness of breath, no abdominal pain, nausea or vomiting.   Discharge Exam: BP (!) 125/94 (BP Location: Left Arm)   Pulse 79   Temp 98 F (36.7 C) (Oral)   Resp 20   Ht 5\' 8"  (1.727 m)   Wt 81.6 kg   SpO2 100%   BMI 27.37 kg/m   General: Pt is alert, awake, not in acute distress Cardiovascular: RRR, S1/S2 +, no rubs, no gallops Respiratory: CTA bilaterally, no wheezing, no rhonchi Abdominal: Soft, NT, ND, bowel sounds + Extremities: no edema, no cyanosis   The results of significant diagnostics from this hospitalization (including imaging, microbiology, ancillary and  laboratory) are listed below for reference.     Microbiology: Recent Results (from the past 240 hour(s))  Resp Panel by RT-PCR (Flu A&B, Covid) Nasopharyngeal Swab     Status: None   Collection Time: 05/09/20  5:13 PM   Specimen: Nasopharyngeal Swab; Nasopharyngeal(NP) swabs in vial transport medium  Result Value Ref Range Status   SARS Coronavirus 2 by RT PCR NEGATIVE NEGATIVE Final    Comment: (NOTE) SARS-CoV-2 target nucleic acids are NOT DETECTED.  The SARS-CoV-2 RNA is generally detectable in upper respiratory specimens during the acute phase of infection. The lowest concentration of SARS-CoV-2 viral copies this assay can detect is 138 copies/mL. A negative result does not preclude SARS-Cov-2 infection and should not be used as the sole basis for treatment or other patient management decisions. A negative result may occur with  improper specimen collection/handling, submission of specimen other than nasopharyngeal swab, presence of viral mutation(s) within the areas targeted by this assay, and inadequate number of viral copies(<138 copies/mL). A negative result must be combined with clinical observations, patient history, and epidemiological information. The expected result is Negative.  Fact Sheet for Patients:   BloggerCourse.com  Fact Sheet for Healthcare Providers:  SeriousBroker.it  This test is no t yet approved or cleared by the [place] FDA and  has been authorized for detection and/or diagnosis of SARS-CoV-2 by FDA under an Emergency Use Authorization (EUA). This EUA will remain  in effect (meaning this test can be used) for the duration of the COVID-19 declaration under Section 564(b)(1) of the Act, 21 U.S.C.section 360bbb-3(b)(1), unless the authorization is terminated  or revoked sooner.       Influenza A by PCR NEGATIVE NEGATIVE Final   Influenza B by PCR NEGATIVE NEGATIVE Final    Comment: (NOTE) The Xpert Xpress SARS-CoV-2/FLU/RSV plus assay is intended as an aid in the diagnosis of influenza from Nasopharyngeal swab specimens and should not be used as a sole basis for treatment. Nasal washings and aspirates are unacceptable for Xpert Xpress SARS-CoV-2/FLU/RSV testing.  Fact Sheet for Patients: BloggerCourse.com  Fact Sheet for Healthcare Providers: SeriousBroker.it  This test is not yet approved or cleared by the [place] FDA and has been authorized for detection and/or diagnosis of SARS-CoV-2 by FDA under an Emergency Use Authorization (EUA). This EUA will remain in effect (meaning this test can be used) for the duration of the COVID-19 declaration under Section 564(b)(1) of the Act, 21 U.S.C. section 360bbb-3(b)(1), unless the authorization is terminated or revoked.  Performed at [facility], [address] [place], [place] [address]      Labs: Basic Metabolic Panel: Recent Labs  Lab 05/09/20 1158 05/10/20 0642  NA 136  --   K 3.1* 3.8  CL 99  --   CO2 27  --   GLUCOSE 95  --   BUN 11  --   CREATININE 0.87  --   CALCIUM 8.8*  --   MG 2.0  --    Liver Function Tests: Recent Labs  Lab 05/09/20 1158  AST 19  ALT 16  ALKPHOS 73   BILITOT 0.2*  PROT 7.6  ALBUMIN 4.7   CBC: Recent Labs  Lab 05/09/20 1158  WBC 8.9  NEUTROABS 3.5  HGB 15.1  HCT 43.6  MCV 91.4  PLT 327   CBG: No results for input(s): GLUCAP in the last 168 hours. Hgb A1c Recent Labs    05/10/20 0406  HGBA1C 5.2   Lipid Profile Recent  Labs    05/10/20 0406  CHOL 211*  HDL 43  LDLCALC 134*  TRIG 168*  CHOLHDL 4.9   Thyroid function studies Recent Labs    05/10/20 0642  TSH 2.602   Urinalysis    Component Value Date/Time   BILIRUBINUR negative 08/19/2017 1554   BILIRUBINUR neg 07/29/2011 1230   KETONESUR negative 08/19/2017 1554   PROTEINUR negative 08/19/2017 1554   PROTEINUR 30 07/29/2011 1230   UROBILINOGEN 0.2 08/19/2017 1554   NITRITE Negative 08/19/2017 1554   NITRITE neg 07/29/2011 1230   LEUKOCYTESUR Negative 08/19/2017 1554    FURTHER DISCHARGE INSTRUCTIONS:   Get Medicines reviewed and adjusted: Please take all your medications with you for your next visit with your Primary MD   Laboratory/radiological data: Please request your Primary MD to go over all hospital tests and procedure/radiological results at the follow up, please ask your Primary MD to get all Hospital records sent to his/her office.   In some cases, they will be blood work, cultures and biopsy results pending at the time of your discharge. Please request that your primary care M.D. goes through all the records of your hospital data and follows up on these results.   Also Note the following: If you experience worsening of your admission symptoms, develop shortness of breath, life threatening emergency, suicidal or homicidal thoughts you must seek medical attention immediately by calling 911 or calling your MD immediately  if symptoms less severe.   You must read complete instructions/literature along with all the possible adverse reactions/side effects for all the Medicines you take and that have been prescribed to you. Take any new Medicines  after you have completely understood and accpet all the possible adverse reactions/side effects.    Do not drive when taking Pain medications or sleeping medications (Benzodaizepines)   Do not take more than prescribed Pain, Sleep and Anxiety Medications. It is not advisable to combine anxiety,sleep and pain medications without talking with your primary care practitioner   Special Instructions: If you have smoked or chewed Tobacco  in the last 2 yrs please stop smoking, stop any regular Alcohol  and or any Recreational drug use.   Wear Seat belts while driving.   Please note: You were cared for by a hospitalist during your hospital stay. Once you are discharged, your primary care physician will handle any further medical issues. Please note that NO REFILLS for any discharge medications will be authorized once you are discharged, as it is imperative that you return to your primary care physician (or establish a relationship with a primary care physician if you do not have one) for your post hospital discharge needs so that they can reassess your need for medications and monitor your lab values.  Time coordinating discharge: 50 minutes  SIGNED:  Pamella Pert, MD, PhD 05/10/2020, 1:07 PM

## 2020-05-10 NOTE — Progress Notes (Signed)
PT Cancellation Note  Patient Details Name: CLEOTHA TSANG MRN: [id number] DOB: February 10, 1981   Cancelled Treatment:    Reason Eval/Treat Not Completed: Patient at procedure or test/unavailable (Pt at MRI when PT came by this am.  Will return as able.  Nurse did notify PT that pt has been up without assist on unit.)   Bevelyn Buckles 05/10/2020, 9:53 AM Delmy Holdren M,PT Acute Rehab Services [phone] (pager)

## 2020-05-10 NOTE — Progress Notes (Signed)
STROKE TEAM PROGRESS NOTE   INTERVAL HISTORY His husband is at the bedside.  He reports that he is no longer having any issues with speech and that his strength is returned to base line. He reports his legs are currently limited due to his back pain. He reports that he is currently having a Migraine because he has not had his medication. He inquired further about possible MS since the tele-neurologist mentioned it. Discussed that given his age and symptoms the MRI was obtained to rule out MS and that no signs where seen on imaging. He requested a copy of his MRI. Discussed that if he wanted it for his own use then he can obtain a copy from Medical records but if he wanted it for his outpatient provider at [facility] they [facility]. He had no other concerns at present.Marland Kitchen.  Hospitalist Service are Primary  Vitals:   05/09/20 2100 05/09/20 2200 05/09/20 2300 05/10/20 0018  BP: 123/78 114/76 113/74 (!) 125/94  Pulse: 79 74 66 79  Resp: 18 16 18 20   Temp:    98 F (36.7 C)  TempSrc:    Oral  SpO2: 98% 96% 95% 100%  Weight:      Height:       CBC:  Recent Labs  Lab 05/09/20 1158  WBC 8.9  NEUTROABS 3.5  HGB 15.1  HCT 43.6  MCV 91.4  PLT 327   Basic Metabolic Panel:  Recent Labs  Lab 05/09/20 1158 05/10/20 0642  NA 136  --   K 3.1* 3.8  CL 99  --   CO2 27  --   GLUCOSE 95  --   BUN 11  --   CREATININE 0.87  --   CALCIUM 8.8*  --   MG 2.0  --    Lipid Panel:  Recent Labs  Lab 05/10/20 0406  CHOL 211*  TRIG 168*  HDL 43  CHOLHDL 4.9  VLDL 34  LDLCALC 161134*   HgbA1c:  Recent Labs  Lab 05/10/20 0406  HGBA1C 5.2   Urine Drug Screen: No results for input(s): LABOPIA, COCAINSCRNUR, LABBENZ, AMPHETMU, THCU, LABBARB in the last 168 hours.  Alcohol Level No results for input(s): ETH in the last 168 hours.  IMAGING past 24 hours CT Head Wo Contrast  Result Date: 05/09/2020 CLINICAL DATA:  Neuro deficit, suspected stroke.  Altered mental status for 1 week. EXAM: CT HEAD WITHOUT CONTRAST TECHNIQUE: Contiguous axial images were obtained from the base of the skull through the vertex without intravenous contrast. COMPARISON:  November of 2017. FINDINGS: Brain: No evidence of acute infarction, hemorrhage, hydrocephalus, extra-axial collection or mass lesion/mass effect. Vascular: No hyperdense vessel or unexpected calcification. Skull: Normal. Negative for fracture or focal lesion. Sinuses/Orbits: Visualized paranasal sinuses and orbits are unremarkable. Other: None. IMPRESSION: No acute intracranial abnormality. Electronically Signed   By: Donzetta KohutGeoffrey  Wile M.D.   On: 05/09/2020 14:51   MR ANGIO HEAD WO CONTRAST  Result Date: 05/10/2020 CLINICAL DATA:  Neuro deficit, acute, stroke suspected EXAM: MRA HEAD WITHOUT CONTRAST TECHNIQUE: Angiographic images of the Circle of Willis were obtained using MRA technique without intravenous contrast. COMPARISON:  None. FINDINGS: The visualized portions of the distal cervical and intracranial internal carotid arteries are widely patent with normal flow related enhancement. The bilateral anterior cerebral arteries and middle cerebral arteries are widely patent with antegrade flow without high-grade flow-limiting stenosis or proximal branch occlusion. No intracranial aneurysm within the anterior circulation. The vertebral  arteries are widely patent with antegrade flow. Vertebrobasilar junction and basilar artery are widely patent with antegrade flow without evidence of basilar stenosis or aneurysm. Posterior cerebral arteries are normal bilaterally. No intracranial aneurysm within the posterior circulation. IMPRESSION: Normal intracranial MRA. Electronically Signed   By: Baldemar Lenis M.D.   On: 05/10/2020 08:32   MR Brain W and Wo Contrast  Result Date: 05/10/2020 CLINICAL DATA:  Initial evaluation for neuro deficit, speech difficulty, episode of weakness and numbness. EXAM: MRI  HEAD WITHOUT AND WITH CONTRAST MRI CERVICAL SPINE WITHOUT AND WITH CONTRAST TECHNIQUE: Multiplanar, multiecho pulse sequences of the brain and surrounding structures, and cervical spine, to include the craniocervical junction and cervicothoracic junction, were obtained without and with intravenous contrast. CONTRAST:  8.18mL GADAVIST GADOBUTROL 1 MMOL/ML IV SOLN COMPARISON:  Prior head CT from 05/09/2020. FINDINGS: MRI HEAD FINDINGS Brain: Cerebral volume within normal limits for patient age. No focal parenchymal signal abnormality identified. No abnormal foci of restricted diffusion to suggest acute or subacute ischemia. Gray-white matter differentiation well maintained. No encephalomalacia to suggest chronic infarction. No foci of susceptibility artifact to suggest acute or chronic intracranial hemorrhage. No mass lesion, midline shift or mass effect. No hydrocephalus. No extra-axial fluid collection. Major dural sinuses are grossly patent. Pituitary gland and suprasellar region are normal. Midline structures intact and normal. No abnormal enhancement. Vascular: Major intracranial vascular flow voids well maintained and normal in appearance. Skull and upper cervical spine: Craniocervical junction normal. Visualized upper cervical spine within normal limits. Bone marrow signal intensity normal. No scalp soft tissue abnormality. Sinuses/Orbits: Globes and orbital soft tissues within normal limits. Mild mucosal thickening noted within the ethmoidal air cells. Paranasal sinuses are otherwise clear. No significant mastoid effusion. Inner ear structures grossly normal. Other: None. MRI CERVICAL SPINE FINDINGS Alignment: Mild dextroscoliosis with straightening of the normal cervical lordosis. No listhesis. Vertebrae: Vertebral body height maintained without acute or chronic fracture. Bone marrow signal intensity within normal limits. No worrisome osseous lesions. No abnormal marrow edema or enhancement. Cord: Normal  signal and morphology.  No abnormal enhancement. Posterior Fossa, vertebral arteries, paraspinal tissues: Craniocervical junction within normal limits. Paraspinous and prevertebral soft tissues within normal limits. Normal flow voids seen within the vertebral arteries bilaterally. Disc levels: No significant disc pathology seen within the cervical spine for age. No significant disc bulge or focal disc herniation. No significant canal or neural foraminal stenosis or evidence for neural impingement. IMPRESSION: 1. Normal brain MRI. No acute intracranial abnormality identified. 2. Normal MRI of the cervical spine and spinal cord. No significant disc pathology, stenosis, or evidence for neural impingement. Electronically Signed   By: Rise Mu M.D.   On: 05/10/2020 03:49   MR Cervical Spine W or Wo Contrast  Result Date: 05/10/2020 CLINICAL DATA:  Initial evaluation for neuro deficit, speech difficulty, episode of weakness and numbness. EXAM: MRI HEAD WITHOUT AND WITH CONTRAST MRI CERVICAL SPINE WITHOUT AND WITH CONTRAST TECHNIQUE: Multiplanar, multiecho pulse sequences of the brain and surrounding structures, and cervical spine, to include the craniocervical junction and cervicothoracic junction, were obtained without and with intravenous contrast. CONTRAST:  8.37mL GADAVIST GADOBUTROL 1 MMOL/ML IV SOLN COMPARISON:  Prior head CT from 05/09/2020. FINDINGS: MRI HEAD FINDINGS Brain: Cerebral volume within normal limits for patient age. No focal parenchymal signal abnormality identified. No abnormal foci of restricted diffusion to suggest acute or subacute ischemia. Gray-white matter differentiation well maintained. No encephalomalacia to suggest chronic infarction. No foci of susceptibility artifact to suggest acute  or chronic intracranial hemorrhage. No mass lesion, midline shift or mass effect. No hydrocephalus. No extra-axial fluid collection. Major dural sinuses are grossly patent. Pituitary gland and  suprasellar region are normal. Midline structures intact and normal. No abnormal enhancement. Vascular: Major intracranial vascular flow voids well maintained and normal in appearance. Skull and upper cervical spine: Craniocervical junction normal. Visualized upper cervical spine within normal limits. Bone marrow signal intensity normal. No scalp soft tissue abnormality. Sinuses/Orbits: Globes and orbital soft tissues within normal limits. Mild mucosal thickening noted within the ethmoidal air cells. Paranasal sinuses are otherwise clear. No significant mastoid effusion. Inner ear structures grossly normal. Other: None. MRI CERVICAL SPINE FINDINGS Alignment: Mild dextroscoliosis with straightening of the normal cervical lordosis. No listhesis. Vertebrae: Vertebral body height maintained without acute or chronic fracture. Bone marrow signal intensity within normal limits. No worrisome osseous lesions. No abnormal marrow edema or enhancement. Cord: Normal signal and morphology.  No abnormal enhancement. Posterior Fossa, vertebral arteries, paraspinal tissues: Craniocervical junction within normal limits. Paraspinous and prevertebral soft tissues within normal limits. Normal flow voids seen within the vertebral arteries bilaterally. Disc levels: No significant disc pathology seen within the cervical spine for age. No significant disc bulge or focal disc herniation. No significant canal or neural foraminal stenosis or evidence for neural impingement. IMPRESSION: 1. Normal brain MRI. No acute intracranial abnormality identified. 2. Normal MRI of the cervical spine and spinal cord. No significant disc pathology, stenosis, or evidence for neural impingement. Electronically Signed   By: Rise Mu M.D.   On: 05/10/2020 03:49   DG Chest Portable 1 View  Result Date: 05/09/2020 CLINICAL DATA:  Chest pain. EXAM: PORTABLE CHEST 1 VIEW COMPARISON:  August 19, 2017. FINDINGS: The heart size and mediastinal contours  are within normal limits. Both lungs are clear. No pneumothorax or pleural effusion is noted. The visualized skeletal structures are unremarkable. IMPRESSION: No active disease. Electronically Signed   By: Lupita Raider M.D.   On: 05/09/2020 14:41   ECHOCARDIOGRAM COMPLETE BUBBLE STUDY  Result Date: 05/10/2020    ECHOCARDIOGRAM REPORT   Patient Name:   LANE ELAND Date of Exam: 05/10/2020 Medical Rec #:  [id number]        Height:       68.0 in Accession #:    [id number]       Weight:       180.0 lb Date of Birth:  1980-07-26        BSA:          1.954 m Patient Age:    39 years         BP:           125/94 mmHg Patient Gender: M                HR:           74 bpm. Exam Location:  Inpatient Procedure: 2D Echo, Cardiac Doppler, Color Doppler and Saline Contrast Bubble            Study Indications:    TIA  History:        Patient has no prior history of Echocardiogram examinations.                 TIA, Signs/Symptoms:Chest Pain; Risk Factors:Hypertension.  Sonographer:    Neomia Dear RDCS Referring Phys: [id number] [facility] IMPRESSIONS  1. Left ventricular ejection fraction, by estimation, is 65 to 70%. The left ventricle has normal function. The left ventricle  has no regional wall motion abnormalities. Left ventricular diastolic parameters were normal. The average left ventricular global longitudinal strain is -19.9 %.  2. Right ventricular systolic function is normal. The right ventricular size is normal.  3. Bubble contrast study was negative for Right to left shunting at the atrial level.  4. The mitral valve is normal in structure. Trivial mitral valve regurgitation. No evidence of mitral stenosis.  5. The aortic valve is normal in structure. Aortic valve regurgitation is not visualized. No aortic stenosis is present. FINDINGS  Left Ventricle: Left ventricular ejection fraction, by estimation, is 65 to 70%. The left ventricle has normal function. The left ventricle has no regional wall motion  abnormalities. The average left ventricular global longitudinal strain is -19.9 %. The left ventricular internal cavity size was normal in size. There is no left ventricular hypertrophy. Left ventricular diastolic parameters were normal. Right Ventricle: The right ventricular size is normal. No increase in right ventricular wall thickness. Right ventricular systolic function is normal. Left Atrium: Left atrial size was normal in size. Right Atrium: Right atrial size was normal in size. Pericardium: There is no evidence of pericardial effusion. Mitral Valve: The mitral valve is normal in structure. Trivial mitral valve regurgitation. No evidence of mitral valve stenosis. Tricuspid Valve: The tricuspid valve is normal in structure. Tricuspid valve regurgitation is trivial. Aortic Valve: The aortic valve is normal in structure. Aortic valve regurgitation is not visualized. No aortic stenosis is present. Aortic valve mean gradient measures 2.0 mmHg. Aortic valve peak gradient measures 4.1 mmHg. Aortic valve area, by VTI measures 5.14 cm. Pulmonic Valve: The pulmonic valve was grossly normal. Pulmonic valve regurgitation is not visualized. No evidence of pulmonic stenosis. Aorta: The aortic root and ascending aorta are structurally normal, with no evidence of dilitation. IAS/Shunts: No atrial level shunt detected by color flow Doppler. Agitated saline contrast was given intravenously to evaluate for intracardiac shunting.  LEFT VENTRICLE PLAX 2D LVIDd:         4.30 cm     Diastology LVIDs:         2.20 cm     LV e' medial:    8.81 cm/s LV PW:         1.00 cm     LV E/e' medial:  7.7 LV IVS:        0.80 cm     LV e' lateral:   9.46 cm/s LVOT diam:     2.60 cm     LV E/e' lateral: 7.2 LV SV:         99 LV SV Index:   51          2D Longitudinal Strain LVOT Area:     5.31 cm    2D Strain GLS Avg:     -19.9 %  LV Volumes (MOD) LV vol d, MOD A2C: 64.1 ml LV vol d, MOD A4C: 71.4 ml LV vol s, MOD A2C: 14.0 ml LV vol s, MOD  A4C: 24.4 ml LV SV MOD A2C:     50.1 ml LV SV MOD A4C:     71.4 ml LV SV MOD BP:      49.1 ml RIGHT VENTRICLE RV S prime:     14.40 cm/s  PULMONARY VEINS TAPSE (M-mode): 2.6 cm      A Reversal Duration: 79.00 msec  A Reversal Velocity: 23.00 cm/s                             Diastolic Velocity:  29.80 cm/s                             S/D Velocity:        1.50                             Systolic Velocity:   45.00 cm/s LEFT ATRIUM           Index       RIGHT ATRIUM           Index LA diam:      2.10 cm 1.07 cm/m  RA Area:     15.50 cm LA Vol (A4C): 36.2 ml 18.52 ml/m RA Volume:   43.20 ml  22.11 ml/m  AORTIC VALVE                   PULMONIC VALVE AV Area (Vmax):    4.49 cm    PV Vmax:       0.96 m/s AV Area (Vmean):   5.04 cm    PV Vmean:      73.700 cm/s AV Area (VTI):     5.14 cm    PV VTI:        0.219 m AV Vmax:           101.00 cm/s PV Peak grad:  3.7 mmHg AV Vmean:          66.100 cm/s PV Mean grad:  2.0 mmHg AV VTI:            0.192 m AV Peak Grad:      4.1 mmHg AV Mean Grad:      2.0 mmHg LVOT Vmax:         85.50 cm/s LVOT Vmean:        62.800 cm/s LVOT VTI:          0.186 m LVOT/AV VTI ratio: 0.97  AORTA Ao Root diam: 3.70 cm Ao Asc diam:  3.30 cm MITRAL VALVE MV Area (PHT): 4.10 cm    SHUNTS MV Decel Time: 185 msec    Systemic VTI:  0.19 m MV E velocity: 68.10 cm/s  Systemic Diam: 2.60 cm MV A velocity: 47.60 cm/s MV E/A ratio:  1.43 Kristeen Miss MD Electronically signed by Kristeen Miss MD Signature Date/Time: 05/10/2020/10:54:55 AM    Final    VAS US CAROTID  Result Date: 05/10/2020 Carotid Arterial Duplex Study Indications:       TIA, Speech disturbance, Numbness and Weakness. Comparison Study:  No prior study Performing Technologist: Sherren Kerns RVS  Examination Guidelines: A complete evaluation includes B-mode imaging, spectral Doppler, color Doppler, and power Doppler as needed of all accessible portions of each vessel. Bilateral testing is considered an  integral part of a complete examination. Limited examinations for reoccurring indications may be performed as noted.  Right Carotid Findings: +----------+--------+--------+--------+------------------+------------------+           PSV cm/sEDV cm/sStenosisPlaque DescriptionComments           +----------+--------+--------+--------+------------------+------------------+ CCA Prox  140     30  intimal thickening +----------+--------+--------+--------+------------------+------------------+ CCA Distal123     41                                intimal thickening +----------+--------+--------+--------+------------------+------------------+ ICA Prox  71      28              heterogenous                         +----------+--------+--------+--------+------------------+------------------+ ICA Distal84      31                                                   +----------+--------+--------+--------+------------------+------------------+ ECA       117     25                                                   +----------+--------+--------+--------+------------------+------------------+ +----------+--------+-------+--------+-------------------+           PSV cm/sEDV cmsDescribeArm Pressure (mmHG) +----------+--------+-------+--------+-------------------+ [id number]                                        +----------+--------+-------+--------+-------------------+ +---------+--------+--+--------+--+ VertebralPSV cm/s55EDV cm/s22 +---------+--------+--+--------+--+  Left Carotid Findings: +----------+--------+--------+--------+------------------+------------------+           PSV cm/sEDV cm/sStenosisPlaque DescriptionComments           +----------+--------+--------+--------+------------------+------------------+ CCA Prox  131     31                                intimal thickening  +----------+--------+--------+--------+------------------+------------------+ CCA Distal118     33              calcific                             +----------+--------+--------+--------+------------------+------------------+ ICA Prox  86      26              heterogenous                         +----------+--------+--------+--------+------------------+------------------+ ICA Distal65      29                                                   +----------+--------+--------+--------+------------------+------------------+ ECA       116     21                                                   +----------+--------+--------+--------+------------------+------------------+ +----------+--------+--------+--------+-------------------+           PSV cm/sEDV cm/sDescribeArm Pressure (mmHG) +----------+--------+--------+--------+-------------------+ [id number]                                         +----------+--------+--------+--------+-------------------+ +---------+--------+--+--------+--+  VertebralPSV cm/s38EDV cm/s11 +---------+--------+--+--------+--+   Summary: Right Carotid: The extracranial vessels were near-normal with only minimal wall                thickening or plaque. Left Carotid: The extracranial vessels were near-normal with only minimal wall               thickening or plaque. Vertebrals:  Bilateral vertebral arteries demonstrate antegrade flow. Subclavians: Normal flow hemodynamics were seen in bilateral subclavian              arteries. *See table(s) above for measurements and observations.     Preliminary     PHYSICAL EXAM Blood pressure (!) 125/94, pulse 79, temperature 98 F (36.7 C), temperature source Oral, resp. rate 20, height  (1.727 m), weight 81.6 kg, SpO2 100 %.  General: alert and awake, Caucasian male, no apparent distress but very anxious appearing  Lungs: Symmetrical Chest rise, no labored breathing  Cardio: Regular Rate and  Rhythm  Abdomen: Soft, non-tender  Neuro: Alert, oriented, thought content appropriate.  Speech fluent without evidence of aphasia.  Able to follow all commands without difficulty. Cranial Nerves: II:  Visual fields grossly normal, pupils equal, round, reactive to light and accommodation III,IV, VI: ptosis not present, extra-ocular motions intact bilaterally V,VII: smile symmetric, facial light touch sensation normal bilaterally VIII: hearing normal bilaterally IX,X: uvula rises symmetrically XI: bilateral shoulder shrug XII: midline tongue extension without atrophy or fasciculations  Motor: Right : Upper extremity   5/5    Left:     Upper extremity   5/5  Lower extremity   5/5     Lower extremity   5/5 Tone and bulk:normal tone throughout; no atrophy noted Sensory: light touch intact throughout, bilaterally Cerebellar: normal finger-to-nose Gait: deferred    ASSESSMENT/PLAN Mr. THOMES BURAK is a 40 y.o. male with history of depression, anxiety, chronic back pain, migraine headaches, hypothyroidism presenting with chest pain, difficulty speaking, and left-sided weakness/numbness. History provided by patient limited. States he has had pain all across his chest for a week and also sharp pain in the middle of his chest. States today he had difficulty speaking and left-sided weakness/numbness. States he was not able to walk at home and has not slept in days. States his symptoms have now resolved and he is able to walk. He is requesting a medication for anxiety.   Multifocal symptoms in the setting of headache doubt complicated migraine.  Do not think this is TIA or seizure   CT head - No acute intracranial abnormality.  MRI - Normal brain MRI. No acute intracranial abnormality identified. Normal MRI of the cervical spine and spinal cord. No significant disc pathology, stenosis, or evidence for neural impingement.  MRA - Normal intracranial MRA  Carotid Doppler no significant  extracranial stenosis.  2D Echo - EF: 65-70% No wall motion abnormalities. Bubble study negative for Right to Left shunt.  LDL 134  HgbA1c 5.2  VTE prophylaxis - Lovenox    Diet   Diet regular Room service appropriate? Yes; Fluid consistency: Thin     No antithrombotic prior to admission, now on No antithrombotic. Aspirin was recommended but patient refused.  Therapy recommendations: None   disposition:  Home  Hypertension  Home meds:  Propanolol, not resumed while here  Stable . Long-term BP goal normotensive  Hyperlipidemia  Home meds:  None  LDL 134, goal < 70   Other Stroke Risk Factors  Family hx stroke (Maternal Grandmother)  Migraines  Other Active Problems  Migraines  GAD  Chronic back pain  Hospital day # 0  Arna Snipe MD Resident  I have personally obtained history,examined this patient, reviewed notes, independently viewed imaging studies, participated in medical decision making and plan of care.ROS completed by me personally and pertinent positives fully documented  I have made any additions or clarifications directly to the above note. Agree with note above.  Patient presented with multifocal symptoms likely from underlying anxiety panic neurological exam as well as brain cervical spine imaging studies are unremarkable.  And any further neurological work-up.  Long discussion with patient and her husband at the bedside and answered questions.  Discussed with Dr. Elvera Lennox.  Greater than 50% time during this 35-minute visit was spent on counseling coordination of care will with multifocal symptoms and discussion of results of imaging studies and  Delia Heady, MD Medical Director [facility] Pager: [phone] 05/10/2020 1:38 PM   To contact Stroke Continuity provider, please refer to [email or web address]. After hours, contact General Neurology

## 2020-05-10 NOTE — Progress Notes (Incomplete)
VASCULAR LAB    Carotid duplex has been performed.  See CV proc for preliminary results.   KANADY, CANDACE, RVT 05/10/2020, 9:51 AM

## 2020-05-10 NOTE — Progress Notes (Signed)
OT Cancellation Note  Patient Details Name: VISHNU MOELLER MRN: [id number] DOB: 08/30/1980   Cancelled Treatment:    Reason Eval/Treat Not Completed: OT screened, no needs identified, will sign off. Spoke with RN and PT - Pt is at baseline with no residual deficits.  Evern Bio Hilliard 05/10/2020, 1:43 PM   Nyoka Cowden OTR/L Acute Rehabilitation Services Pager: [phone] Office: [phone]

## 2020-05-10 NOTE — Progress Notes (Signed)
Pt is highly upset and frustrated this morning due to him being under much stress and finding out he may have had a possible stroke as well as may have multiple sclerosis. He has been wanting his same five meds of Trazodone, Clonazepam, Oxycodone, Cyclobenzaprine, and Butalbital-Acetaminophen-caffeine since he was admitted around 12am. Orders were not yet in, due to the admitting having to come see and assess him first. On her arrival, he was about to leave for his MRI and she was trying to talk with the pt and assess him before going but he was ready to go to MRI and wasn't talking to her much. He told her can she have his meds ordered by the time he comes back. After he left for MRI they called for me to come give him Ativan the MD had put in for him prior to the test at my request because he was anxious. Once pt returned,  he wanted all his meds especially the one for his chronic headache. So I paged the MD to request them again and to make sure it was ok for me to give him his past due meds so late. The MD said he has Tylenol for his headache and that she doesn't feel comfortable giving he any more sedatives after he got ativan and other sedatives during the MRI. MD said if he wants the night meds that he didn't get then that's fine. Therefore, the pt got everything except his Cyclobenzaprine and Butalbital-acetaminophen-caffine. Pt said we wasn't adequally fufilling his needs and that he was gone send his husband to get his meds from home. He also refused the Asprin and basically said we are incompetent for putting it in is meds list due to him being allergic to NSAIDS. I tried my best to fulfill the pts concerns and needs but he was being so rude and not listening to much of what I'm saying after not getting all the meds he wanted. Will continue to monitor.

## 2020-05-10 NOTE — Discharge Instructions (Signed)
Follow with Caffie Damme, MD in 5-7 days  Please get a complete blood count and chemistry panel checked by your Primary MD at your next visit, and again as instructed by your Primary MD. Please get your medications reviewed and adjusted by your Primary MD.  Please request your Primary MD to go over all Hospital Tests and Procedure/Radiological results at the follow up, please get all Hospital records sent to your Prim MD by signing hospital release before you go home.  In some cases, there will be blood work, cultures and biopsy results pending at the time of your discharge. Please request that your primary care M.D. goes through all the records of your hospital data and follows up on these results.  If you had Pneumonia of Lung problems at the Hospital: Please get a 2 view Chest X ray done in 6-8 weeks after hospital discharge or sooner if instructed by your Primary MD.  If you have Congestive Heart Failure: Please call your Cardiologist or Primary MD anytime you have any of the following symptoms:  1) 3 pound weight gain in 24 hours or 5 pounds in 1 week  2) shortness of breath, with or without a dry hacking cough  3) swelling in the hands, feet or stomach  4) if you have to sleep on extra pillows at night in order to breathe  Follow cardiac low salt diet and 1.5 lit/day fluid restriction.  If you have diabetes Accuchecks 4 times/day, Once in AM empty stomach and then before each meal. Log in all results and show them to your primary doctor at your next visit. If any glucose reading is under 80 or above 300 call your primary MD immediately.  If you have Seizure/Convulsions/Epilepsy: Please do not drive, operate heavy machinery, participate in activities at heights or participate in high speed sports until you have seen by Primary MD or a Neurologist and advised to do so again. Per [facility] statutes, patients with seizures are not allowed to drive until they have been  seizure-free for six months.  Use caution when using heavy equipment or power tools. Avoid working on ladders or at heights. Take showers instead of baths. Ensure the water temperature is not too high on the home water heater. Do not go swimming alone. Do not lock yourself in a room alone (i.e. bathroom). When caring for infants or small children, sit down when holding, feeding, or changing them to minimize risk of injury to the child in the event you have a seizure. Maintain good sleep hygiene. Avoid alcohol.   If you had Gastrointestinal Bleeding: Please ask your Primary MD to check a complete blood count within one week of discharge or at your next visit. Your endoscopic/colonoscopic biopsies that are pending at the time of discharge, will also need to followed by your Primary MD.  Get Medicines reviewed and adjusted. Please take all your medications with you for your next visit with your Primary MD  Please request your Primary MD to go over all hospital tests and procedure/radiological results at the follow up, please ask your Primary MD to get all Hospital records sent to his/her office.  If you experience worsening of your admission symptoms, develop shortness of breath, life threatening emergency, suicidal or homicidal thoughts you must seek medical attention immediately by calling 911 or calling your MD immediately  if symptoms less severe.  You must read complete instructions/literature along with all the possible adverse reactions/side effects for all the Medicines you take  and that have been prescribed to you. Take any new Medicines after you have completely understood and accpet all the possible adverse reactions/side effects.   Do not drive or operate heavy machinery when taking Pain medications.   Do not take more than prescribed Pain, Sleep and Anxiety Medications  Special Instructions: If you have smoked or chewed Tobacco  in the last 2 yrs please stop smoking, stop any regular  Alcohol  and or any Recreational drug use.  Wear Seat belts while driving.  Please note You were cared for by a hospitalist during your hospital stay. If you have any questions about your discharge medications or the care you received while you were in the hospital after you are discharged, you can call the unit and asked to speak with the hospitalist on call if the hospitalist that took care of you is not available. Once you are discharged, your primary care physician will handle any further medical issues. Please note that NO REFILLS for any discharge medications will be authorized once you are discharged, as it is imperative that you return to your primary care physician (or establish a relationship with a primary care physician if you do not have one) for your aftercare needs so that they can reassess your need for medications and monitor your lab values.  You can reach the hospitalist office at phone [phone] or fax [phone]   If you do not have a primary care physician, you can call [phone] for a physician referral.  Activity: As tolerated with Full fall precautions use walker/cane & assistance as needed    Diet: regular  Disposition Home

## 2020-05-10 NOTE — H&P (Signed)
History and Physical    Dalton Kelly Dalton Kelly DOB: 17-Sep-1980 DOA: 05/09/2020  PCP: Caffie Damme, MD Patient coming from: [facility]  Chief Complaint: Multiple complaints  HPI: Dalton Kelly is a 40 y.o. male with medical history significant of depression, anxiety, chronic back pain, migraine headaches, hypothyroidism presented to med [facility] ED with complaints of chest pain, difficulty speaking, and left-sided weakness/numbness.  History provided by patient limited.  States he has had pain all across his chest for a week and also sharp pain in the middle of his chest.  States today he had difficulty speaking and left-sided weakness/numbness.  States he was not able to walk at home and has not slept in days.  States his symptoms have now resolved and he is able to walk.  He is requesting a medication for anxiety.  No additional history could be obtained from him at this time.  ED Course: Vital signs stable.  Labs showing WBC 8.9, hemoglobin 15.1, platelet count 327K.  Sodium 136, potassium 3.1, chloride 99, bicarb 27, BUN 11, creatinine 0.8, glucose 95.  High-sensitivity troponin negative x2 and EKG without acute ischemic changes.  Magnesium level normal.  SARS-CoV-2 PCR test negative.  Influenza panel negative.  Chest x-ray showing no active disease.  Head CT negative for acute intracranial abnormality. Patient was seen by telemetry neurology and his symptoms were felt to be due to TIA versus possible MS.  Recommended obtaining MRI of brain and C-spine both without and with contrast. Recommended initiating aspirin 81 mg daily. Patient was given oral potassium 40 mEq, 1 L LR bolus, and IV Ativan 0.5 mg.  Review of Systems:  All systems reviewed and apart from history of presenting illness, are negative.  Past Medical History:  Diagnosis Date  . Anxiety   . Back pain   . Barbiturate dependence (HCC)   . Episode of syncope    Recent that sounds vasovagal  . Fatigue   .  Generalized anxiety disorder   . Hair loss   . Insomnia   . Irregular bowel habits   . Migraines   . Opioid dependence in controlled environment (HCC)   . Systolic click     Past Surgical History:  Procedure Laterality Date  . WISDOM TOOTH EXTRACTION       reports that he has quit smoking. He has never used smokeless tobacco. He reports that he does not drink alcohol and does not use drugs.  Allergies  Allergen Reactions  . Gabapentin Other (See Comments)    unk  . Hydrocodone Other (See Comments)    unk  . Ibuprofen     Abdominal pain  . Nsaids Nausea And Vomiting  . Other   . Requip [Ropinirole Hcl] Other (See Comments)    Severe abdominal pain    Family History  Problem Relation Age of Onset  . Cancer Mother   . Hypertension Father   . Stroke Maternal Grandmother   . Alzheimer's disease Maternal Grandmother   . Cancer Maternal Grandfather   . Diabetes Maternal Grandfather   . Cancer Paternal Grandmother   . Heart failure Paternal Grandfather     Prior to Admission medications   Medication Sig Start Date End Date Taking? Authorizing Provider  butalbital-acetaminophen-caffeine (FIORICET WITH CODEINE) 50-325-40-30 MG capsule Take 1 capsule by mouth every 6 (six) hours as needed for headache or migraine. Do not take more than 6 tabs/d. 04/28/18  Yes Sherren Mocha, MD  clonazePAM (KLONOPIN) 0.5 MG tablet Take  1 tab po qam and midday and 2 tabs po qhs 04/26/18  Yes Sherren Mocha, MD  cyclobenzaprine (FLEXERIL) 5 MG tablet Take 10 mg by mouth 3 (three) times daily as needed. 10/25/15  Yes Frederico Hamman, MD  levothyroxine (SYNTHROID, LEVOTHROID) 50 MCG tablet Take 1 tablet (50 mcg total) by mouth daily before breakfast. 04/28/18  Yes Sherren Mocha, MD  ondansetron (ZOFRAN-ODT) 8 MG disintegrating tablet Take 8 mg by mouth every 8 (eight) hours as needed for nausea. 05/03/20  Yes [provider]  traZODone (DESYREL) 150 MG tablet Take 0.5 tablets (75 mg total) by mouth at  bedtime. 04/28/18  Yes Sherren Mocha, MD  AJOVY 225 MG/1.5ML SOSY  01/20/17   [provider]  buPROPion (WELLBUTRIN SR) 100 MG 12 hr tablet Take 1 tablet (100 mg total) by mouth 2 (two) times daily. PLEASE SEE ATTACHED FOR DETAILED DIRECTIONS 02/14/18   Sherren Mocha, MD  Oxycodone HCl 10 MG TABS Take 1 tablet (10 mg total) by mouth at bedtime as needed. Patient taking differently: Take 10 mg by mouth 4 (four) times daily. 02/14/18   Sherren Mocha, MD  promethazine (PHENERGAN) 25 MG tablet Take 1 tablet (25 mg total) by mouth every 8 (eight) hours as needed for nausea or vomiting (migraine headache). 04/28/18   Sherren Mocha, MD  propranolol Bevely Palmer) 20 MG tablet Please specify directions, refills and quantity 05/23/18   Sherren Mocha, MD    Physical Exam: Vitals:   05/09/20 2100 05/09/20 2200 05/09/20 2300 05/10/20 0018  BP: 123/78 114/76 113/74 (!) 125/94  Pulse: 79 74 66 79  Resp: 18 16 18 20   Temp:    98 F (36.7 C)  TempSrc:    Oral  SpO2: 98% 96% 95% 100%  Weight:      Height:        Physical Exam Constitutional:      General: He is not in acute distress. HENT:     Head: Normocephalic and atraumatic.  Eyes:     Extraocular Movements: Extraocular movements intact.     Conjunctiva/sclera: Conjunctivae normal.  Cardiovascular:     Rate and Rhythm: Normal rate and regular rhythm.     Pulses: Normal pulses.  Pulmonary:     Effort: Pulmonary effort is normal. No respiratory distress.     Breath sounds: Normal breath sounds. No wheezing or rales.  Abdominal:     General: Bowel sounds are normal. There is no distension.     Palpations: Abdomen is soft.     Tenderness: There is no abdominal tenderness.  Musculoskeletal:        General: No swelling or tenderness.     Cervical back: Normal range of motion and neck supple.  Skin:    General: Skin is warm and dry.  Neurological:     Mental Status: He is alert and oriented to person, place, and time.     Cranial Nerves: No cranial  nerve deficit.     Sensory: Sensory deficit present.     Motor: No weakness.     Comments: Sensation to light touch diminished in the left upper and lower extremities     Labs on Admission: I have personally reviewed following labs and imaging studies  CBC: Recent Labs  Lab 05/09/20 1158  WBC 8.9  NEUTROABS 3.5  HGB 15.1  HCT 43.6  MCV 91.4  PLT 327   Basic Metabolic Panel: Recent Labs  Lab 05/09/20 1158  NA 136  K 3.1*  CL 99  CO2 27  GLUCOSE 95  BUN 11  CREATININE 0.87  CALCIUM 8.8*  MG 2.0   GFR: Estimated Creatinine Clearance: 109.2 mL/min (by C-G formula based on SCr of 0.87 mg/dL). Liver Function Tests: Recent Labs  Lab 05/09/20 1158  AST 19  ALT 16  ALKPHOS 73  BILITOT 0.2*  PROT 7.6  ALBUMIN 4.7   No results for input(s): LIPASE, AMYLASE in the last 168 hours. No results for input(s): AMMONIA in the last 168 hours. Coagulation Profile: No results for input(s): INR, PROTIME in the last 168 hours. Cardiac Enzymes: No results for input(s): CKTOTAL, CKMB, CKMBINDEX, TROPONINI in the last 168 hours. BNP (last 3 results) No results for input(s): PROBNP in the last 8760 hours. HbA1C: Recent Labs    05/10/20 0406  HGBA1C 5.2   CBG: No results for input(s): GLUCAP in the last 168 hours. Lipid Profile: Recent Labs    05/10/20 0406  CHOL 211*  HDL 43  LDLCALC 134*  TRIG 168*  CHOLHDL 4.9   Thyroid Function Tests: No results for input(s): TSH, T4TOTAL, FREET4, T3FREE, THYROIDAB in the last 72 hours. Anemia Panel: No results for input(s): VITAMINB12, FOLATE, FERRITIN, TIBC, IRON, RETICCTPCT in the last 72 hours. Urine analysis:    Component Value Date/Time   BILIRUBINUR negative 08/19/2017 1554   BILIRUBINUR neg 07/29/2011 1230   KETONESUR negative 08/19/2017 1554   PROTEINUR negative 08/19/2017 1554   PROTEINUR 30 07/29/2011 1230   UROBILINOGEN 0.2 08/19/2017 1554   NITRITE Negative 08/19/2017 1554   NITRITE neg 07/29/2011 1230    LEUKOCYTESUR Negative 08/19/2017 1554    Radiological Exams on Admission: CT Head Wo Contrast  Result Date: 05/09/2020 CLINICAL DATA:  Neuro deficit, suspected stroke. Altered mental status for 1 week. EXAM: CT HEAD WITHOUT CONTRAST TECHNIQUE: Contiguous axial images were obtained from the base of the skull through the vertex without intravenous contrast. COMPARISON:  November of 2017. FINDINGS: Brain: No evidence of acute infarction, hemorrhage, hydrocephalus, extra-axial collection or mass lesion/mass effect. Vascular: No hyperdense vessel or unexpected calcification. Skull: Normal. Negative for fracture or focal lesion. Sinuses/Orbits: Visualized paranasal sinuses and orbits are unremarkable. Other: None. IMPRESSION: No acute intracranial abnormality. Electronically Signed   By: Donzetta Kohut M.D.   On: 05/09/2020 14:51   MR Brain W and Wo Contrast  Result Date: 05/10/2020 CLINICAL DATA:  Initial evaluation for neuro deficit, speech difficulty, episode of weakness and numbness. EXAM: MRI HEAD WITHOUT AND WITH CONTRAST MRI CERVICAL SPINE WITHOUT AND WITH CONTRAST TECHNIQUE: Multiplanar, multiecho pulse sequences of the brain and surrounding structures, and cervical spine, to include the craniocervical junction and cervicothoracic junction, were obtained without and with intravenous contrast. CONTRAST:  8.31mL GADAVIST GADOBUTROL 1 MMOL/ML IV SOLN COMPARISON:  Prior head CT from 05/09/2020. FINDINGS: MRI HEAD FINDINGS Brain: Cerebral volume within normal limits for patient age. No focal parenchymal signal abnormality identified. No abnormal foci of restricted diffusion to suggest acute or subacute ischemia. Gray-white matter differentiation well maintained. No encephalomalacia to suggest chronic infarction. No foci of susceptibility artifact to suggest acute or chronic intracranial hemorrhage. No mass lesion, midline shift or mass effect. No hydrocephalus. No extra-axial fluid collection. Major dural  sinuses are grossly patent. Pituitary gland and suprasellar region are normal. Midline structures intact and normal. No abnormal enhancement. Vascular: Major intracranial vascular flow voids well maintained and normal in appearance. Skull and upper cervical spine: Craniocervical junction normal. Visualized upper cervical spine within normal limits. Bone marrow signal  intensity normal. No scalp soft tissue abnormality. Sinuses/Orbits: Globes and orbital soft tissues within normal limits. Mild mucosal thickening noted within the ethmoidal air cells. Paranasal sinuses are otherwise clear. No significant mastoid effusion. Inner ear structures grossly normal. Other: None. MRI CERVICAL SPINE FINDINGS Alignment: Mild dextroscoliosis with straightening of the normal cervical lordosis. No listhesis. Vertebrae: Vertebral body height maintained without acute or chronic fracture. Bone marrow signal intensity within normal limits. No worrisome osseous lesions. No abnormal marrow edema or enhancement. Cord: Normal signal and morphology.  No abnormal enhancement. Posterior Fossa, vertebral arteries, paraspinal tissues: Craniocervical junction within normal limits. Paraspinous and prevertebral soft tissues within normal limits. Normal flow voids seen within the vertebral arteries bilaterally. Disc levels: No significant disc pathology seen within the cervical spine for age. No significant disc bulge or focal disc herniation. No significant canal or neural foraminal stenosis or evidence for neural impingement. IMPRESSION: 1. Normal brain MRI. No acute intracranial abnormality identified. 2. Normal MRI of the cervical spine and spinal cord. No significant disc pathology, stenosis, or evidence for neural impingement. Electronically Signed   By: Rise MuBenjamin  McClintock M.D.   On: 05/10/2020 03:49   MR Cervical Spine W or Wo Contrast  Result Date: 05/10/2020 CLINICAL DATA:  Initial evaluation for neuro deficit, speech difficulty,  episode of weakness and numbness. EXAM: MRI HEAD WITHOUT AND WITH CONTRAST MRI CERVICAL SPINE WITHOUT AND WITH CONTRAST TECHNIQUE: Multiplanar, multiecho pulse sequences of the brain and surrounding structures, and cervical spine, to include the craniocervical junction and cervicothoracic junction, were obtained without and with intravenous contrast. CONTRAST:  8.715mL GADAVIST GADOBUTROL 1 MMOL/ML IV SOLN COMPARISON:  Prior head CT from 05/09/2020. FINDINGS: MRI HEAD FINDINGS Brain: Cerebral volume within normal limits for patient age. No focal parenchymal signal abnormality identified. No abnormal foci of restricted diffusion to suggest acute or subacute ischemia. Gray-white matter differentiation well maintained. No encephalomalacia to suggest chronic infarction. No foci of susceptibility artifact to suggest acute or chronic intracranial hemorrhage. No mass lesion, midline shift or mass effect. No hydrocephalus. No extra-axial fluid collection. Major dural sinuses are grossly patent. Pituitary gland and suprasellar region are normal. Midline structures intact and normal. No abnormal enhancement. Vascular: Major intracranial vascular flow voids well maintained and normal in appearance. Skull and upper cervical spine: Craniocervical junction normal. Visualized upper cervical spine within normal limits. Bone marrow signal intensity normal. No scalp soft tissue abnormality. Sinuses/Orbits: Globes and orbital soft tissues within normal limits. Mild mucosal thickening noted within the ethmoidal air cells. Paranasal sinuses are otherwise clear. No significant mastoid effusion. Inner ear structures grossly normal. Other: None. MRI CERVICAL SPINE FINDINGS Alignment: Mild dextroscoliosis with straightening of the normal cervical lordosis. No listhesis. Vertebrae: Vertebral body height maintained without acute or chronic fracture. Bone marrow signal intensity within normal limits. No worrisome osseous lesions. No abnormal  marrow edema or enhancement. Cord: Normal signal and morphology.  No abnormal enhancement. Posterior Fossa, vertebral arteries, paraspinal tissues: Craniocervical junction within normal limits. Paraspinous and prevertebral soft tissues within normal limits. Normal flow voids seen within the vertebral arteries bilaterally. Disc levels: No significant disc pathology seen within the cervical spine for age. No significant disc bulge or focal disc herniation. No significant canal or neural foraminal stenosis or evidence for neural impingement. IMPRESSION: 1. Normal brain MRI. No acute intracranial abnormality identified. 2. Normal MRI of the cervical spine and spinal cord. No significant disc pathology, stenosis, or evidence for neural impingement. Electronically Signed   By: Janell QuietBenjamin  McClintock M.D.  On: 05/10/2020 03:49   DG Chest Portable 1 View  Result Date: 05/09/2020 CLINICAL DATA:  Chest pain. EXAM: PORTABLE CHEST 1 VIEW COMPARISON:  August 19, 2017. FINDINGS: The heart size and mediastinal contours are within normal limits. Both lungs are clear. No pneumothorax or pleural effusion is noted. The visualized skeletal structures are unremarkable. IMPRESSION: No active disease. Electronically Signed   By: Lupita Raider M.D.   On: 05/09/2020 14:41    EKG: Independently reviewed.  Normal sinus rhythm, no acute ischemic changes.  Assessment/Plan Principal Problem:   TIA (transient ischemic attack) Active Problems:   Insomnia   Chest pain   Hypokalemia   Anxiety   TIA: Patient here for evaluation of acute onset speech issues, left-sided weakness/numbness. Head CT negative for acute intracranial abnormality.  Brain MRI and MRI C-spine negative.  Seen by neurology and his presentation is felt to be due to a TIA. -Neurology following, appreciate recommendations -Telemetry monitoring -Vascular imaging with MRA angio head without contrast and carotid Dopplers -TTE -A1c, lipid panel -Neurology  recommending antithrombotic therapy -aspirin 81 mg daily or Plavix 75 mg daily.  Patient has declined aspirin.  Continue to discuss starting antithrombotic therapy, start Plavix if it is okay with the patient. -Allow for permissive hypertension up to 220/110 for the first 24 hours -Frequent neurochecks -PT, OT, speech therapy. -N.p.o. until cleared by bedside swallow evaluation or formal speech evaluation -UDS  Chest pain: I am not able to obtain a thorough history from the patient.  Unclear whether he is still having chest pain.  ACS less likely as high-sensitivity troponin negative x2 and EKG without acute ischemic changes.  Chest x-ray showing no active disease.  PE less likely given no tachycardia or hypoxia. -Cardiac monitoring.  Check D-dimer level, if elevated, CTA to rule out PE. - Mild hypokalemia: Potassium 3.1, magnesium level within normal range. -Patient received oral potassium supplement in the ED, continue to monitor potassium level.  Anxiety -Continue home Klonopin  Chronic back pain -Continue home scheduled oxycodone and Flexeril prn  Chronic migraine headaches -Discussed with neurology, recommending continuing home Fioricet as needed.  Hypothyroidism -Continue home Synthroid  Insomnia -Continue home trazodone  DVT prophylaxis: Lovenox Code Status: Full code Family Communication: Patient's husband at bedside. Disposition Plan: Status is: Observation  The patient remains OBS appropriate and will d/c before 2 midnights.  Dispo: The patient is from: Home              Anticipated d/c is to: Home              Patient currently is not medically stable to d/c.   Difficult to place patient No  Level of care: Level of care: Telemetry Cardiac   The medical decision making on this patient was of high complexity and the patient is at high risk for clinical deterioration, therefore this is a level 3 visit.  John Giovanni MD [facility]  If 7PM-7AM, please  contact night-coverage [email or web address]  05/10/2020, 6:38 AM

## 2024-03-10 ENCOUNTER — Emergency Department (HOSPITAL_BASED_OUTPATIENT_CLINIC_OR_DEPARTMENT_OTHER)

## 2024-03-10 ENCOUNTER — Other Ambulatory Visit: Payer: Self-pay

## 2024-03-10 ENCOUNTER — Encounter (HOSPITAL_BASED_OUTPATIENT_CLINIC_OR_DEPARTMENT_OTHER): Payer: Self-pay

## 2024-03-10 ENCOUNTER — Emergency Department (HOSPITAL_BASED_OUTPATIENT_CLINIC_OR_DEPARTMENT_OTHER)
Admission: EM | Admit: 2024-03-10 | Discharge: 2024-03-11 | Disposition: A | Discharge status: Home / Self Care | Attending: Emergency Medicine | Admitting: Emergency Medicine

## 2024-03-10 DIAGNOSIS — R072 Precordial pain: Secondary | ICD-10-CM | POA: Diagnosis present

## 2024-03-10 LAB — BASIC METABOLIC PANEL WITH GFR
Anion gap: 12 (ref 5–15)
BUN: 13 mg/dL (ref 6–20)
CO2: 26 mmol/L (ref 22–32)
Calcium: 9 mg/dL (ref 8.9–10.3)
Chloride: 101 mmol/L (ref 98–111)
Creatinine, Ser: 0.86 mg/dL (ref 0.61–1.24)
GFR, Estimated: >60 mL/min
Glucose, Bld: 99 mg/dL (ref 70–99)
Potassium: 3.9 mmol/L (ref 3.5–5.1)
Sodium: 139 mmol/L (ref 135–145)

## 2024-03-10 LAB — CBC
HCT: 43.7 % (ref 39.0–52.0)
Hemoglobin: 15.3 g/dL (ref 13.0–17.0)
MCH: 30.7 pg (ref 26.0–34.0)
MCHC: 35 g/dL (ref 30.0–36.0)
MCV: 87.6 fL (ref 80.0–100.0)
Platelets: 259 K/uL (ref 150–400)
RBC: 4.99 MIL/uL (ref 4.22–5.81)
RDW: 11.5 % (ref 11.5–15.5)
WBC: 6.6 K/uL (ref 4.0–10.5)
nRBC: 0 % (ref 0.0–0.2)

## 2024-03-10 LAB — TROPONIN T, HIGH SENSITIVITY: Troponin T High Sensitivity: <15 ng/L (ref 0–19)

## 2024-03-10 MED ORDER — KETOROLAC TROMETHAMINE 30 MG/ML IJ SOLN
15.0000 mg | Freq: Once | INTRAMUSCULAR | Status: AC
  Administered 2024-03-10: 15 mg via INTRAVENOUS
  Filled 2024-03-10: qty 1

## 2024-03-10 MED ORDER — FAMOTIDINE IN NACL 20-0.9 MG/50ML-% IV SOLN
20.0000 mg | Freq: Once | INTRAVENOUS | Status: AC
  Administered 2024-03-10: 20 mg via INTRAVENOUS
  Filled 2024-03-10: qty 50

## 2024-03-10 MED ORDER — ONDANSETRON HCL 4 MG/2ML IJ SOLN
4.0000 mg | Freq: Once | INTRAMUSCULAR | Status: AC
  Administered 2024-03-10: 4 mg via INTRAVENOUS
  Filled 2024-03-10: qty 2

## 2024-03-10 MED ORDER — SODIUM CHLORIDE 0.9 % IV BOLUS
500.0000 mL | Freq: Once | INTRAVENOUS | Status: AC
  Administered 2024-03-10: 500 mL via INTRAVENOUS

## 2024-03-10 MED ORDER — ALUM & MAG HYDROXIDE-SIMETH 200-200-20 MG/5ML PO SUSP
30.0000 mL | Freq: Once | ORAL | Status: AC
  Administered 2024-03-10: 30 mL via ORAL
  Filled 2024-03-10: qty 30

## 2024-03-10 NOTE — ED Triage Notes (Signed)
 Sx x months. Mid to left sided chest pain that radiates to right arm and to neck/upper back. Nausea. Denies vomiting/diarrhea. Fever 2 weeks ago, but denies fever now. Symptoms worsening over the past few weeks. Patient states it feels like indigestion. Patient appears anxious and has delayed responses.

## 2024-03-10 NOTE — ED Notes (Signed)
Patient transported to imaging at this time.

## 2024-03-11 LAB — HEPATIC FUNCTION PANEL
ALT: 26 U/L (ref 0–44)
AST: 23 U/L (ref 15–41)
Albumin: 4.1 g/dL (ref 3.5–5.0)
Alkaline Phosphatase: 86 U/L (ref 38–126)
Bilirubin, Direct: <0.1 mg/dL (ref 0.0–0.2)
Total Bilirubin: 0.3 mg/dL (ref 0.0–1.2)
Total Protein: 6.4 g/dL — ABNORMAL LOW (ref 6.5–8.1)

## 2024-03-11 LAB — TROPONIN T, HIGH SENSITIVITY: Troponin T High Sensitivity: <15 ng/L (ref 0–19)

## 2024-03-11 MED ORDER — OMEPRAZOLE 20 MG PO CPDR: 20.0000 mg | DELAYED_RELEASE_CAPSULE | Freq: Every day | ORAL | 0 refills | Status: AC

## 2024-03-11 NOTE — ED Notes (Signed)
 Arrived to room to collect pt labs.  Pt in bed notes multiple questions in ED processes.  He was informed that the standard ED care is to rule out acute problems that need to be addressed immediately, then from there the provider will investigate with in means.  Pt was understanding of same.  Pt labs collected during IV access.  XR arrived to room at completion and pt was taken to XR.

## 2024-03-11 NOTE — ED Notes (Signed)
 Arrived to room after the ivf bolus and pepcid  were complete.  Pt noted that he was ready to leave and feeling better. I confirmed with pt that he had no more questions, also noting that there was a repeat trop as the standard of care.  He agreed to stay for same.  Will continue to monitor.

## 2024-03-11 NOTE — ED Notes (Addendum)
 EDP arrived to room and medication administration was being completed.  EDP spoke with pt at length that all blood work was negative.  Noting unremarkable blood work and chest XR.  EDP offered more detailed summary of this at the pts request.  EDP also informed the pt of the medications to treat what was presenting as GI in nature VS cardiac, this was also explained at length.  It was noted that the pt attempted to contradict the EDP during same.  EDP continued to explain until the pt agreed that he had no more questions.  EDP and this medic left the room at that time.

## 2024-03-11 NOTE — ED Provider Notes (Signed)
 Patient stated he has been under stress since October 2024 and has had several episodes of chest pain.  He states he has been not eating well since march of 2025 due to his dentures and is awaiting implants.  He states he has been taking protein shakes and powerade and liquid IV.  He started having symptoms while in bed this evening.  They were associated with belching which is new and nausea.  There was also radiation to the neck which he states was new.  He was concerned as he stated in the 20 years I have been with my husband he has not hear me belch.  EDP examined the patient.  I explained we would be getting labs including cardiac enzymes, first set and then a second set 2 hours later.  I explained the EKG was normal and unchanged. Patient then asked for something to drink as he is thirsty.  Patient was concerned about his heart rate and blood pressure.  In the room it was 119/83.  I explained that is normal.  Patient and spouse were then concerned that the first blood pressure in triage was elevated.  EDP stated for BP to be accurate the patient must be at rest for 5-10 minutes and this is not the case in triage as patient would have just walked in.  Moreover, I explained that the patient is under stress having had to come to the ED in the first place.  BPs since triage at rest are normal.  Patient and spouse then stated it was elevated at home.  I attempted to explain, again, patient was under stress due to chest pain and was cut off and dismissed.   I explained that we would be doing a cardiac rule out but I would be ordering IV fluids for the patient and zofran  as he had mentioned nausea as an associated symptom.  I stated I would also be ordered a GI cocktail.  Patient then started he does not carry a diagnosis of GERD.  EDP politely stated that the belching and indigestion he had mentioned could be GERD and this medication would help with this.  Patient and spouse gave EDP an incredulous look.    Patient then attempted to say I was only working him up for GERD.  EDP stated that is not what I had said.  I stated we are ruling out life threatening causes of chest pain but labs were not yet back.   EDP exited the room.  Labs returned and I went back in and [place] (paramedic) was in the room with medication.  He asked why he was getting the medications.  I stated again the purpose of the GI cocktail.  Again, the patient stated he does not have GERD.  I explained he could decline this medication.  I ordered it in order to make him feel better.  I explained that I had come back in to update him on his labs that had just returned.  I explained that his CBC, electrolytes including anion gap were normal.  I explained that the first troponin was normal as was his chest Xray and this is good news and very reassuring.  Then patient then stated he has never had indigestion before.  I explained that people can change over time in terms of health and circumstance and that this can given a person an issue they did not have previously.  I explained I was not in any way dismissing his concerns and that is  why we ordered the EKG chest Xray and serial cardiac enzymes.  He and is spouse attempted to twist my words and Meghan stated that is not what I had said.  EDP reiterated (politely while seated) that the second set of cardiac enzymes would be drawn 2 hours from the first.  Patient seemed annoyed and asked how long that would take.  EDP stated the first set had just returned so it would be in about an hour and 40 minutes.  Meghan double checked this and agreed. Patient accepted the medications.   EDP left.  Meghan reported that she was uncomfortable when she went back into patient's room alone and patient had asked a lot of questions like if she knew me (EDP).  She reports she said yes and that I was very smart.  She reports the patient asked if this could all be anxiety. She also reported that the patient's husband  mentioned he had had stomach issues.  Given these concerns which were not mentioned to me I added on a hepatic function panel to ensure I was not missing anything.   We went back in the room together when the second set of troponins returned.  I sat and explained the set set of troponins was also normal.  Patient stated that he felt better with medications.  EDP explained that I had sent a prescriptions for Prilosec in to the pharmacy.  Patient states he would not be filling this medication and intended to follow up with his family doctor.  He seemed annoyed that I had sent in a prescription.  I also stated I was making a referral to Dr. Elicia of gastroenterology if the indigestion and belching persists. He said he would not be doing that. EDP politely stated I want to felt him to feel better and take care of him as a whole.  He said I said thanks.       Deatra Mcmahen, MD 03/11/24 [id number]

## 2024-03-11 NOTE — ED Notes (Signed)
 During collection of pts repeat trop, family at bedside mentioned some stomach discomfort that has appeared in the past pt dismissed same.  This mention was brought to the EDP and lfts were ordered as well.

## 2024-03-11 NOTE — ED Notes (Signed)
 Arrived to room to DC this pt with the EDP.  Pts complete care while in the ED was gone over with the pt.  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  During the Dispo the edp mentioned the prilosec that she sent for him, he declined wanting to pick that up and noted he would talk with his PCP.  The ED physician also noted a GI consult for this pt as well, and encouraged a follow up with them.  Pt was presented with DC paper work and showed each of the items the provider mentioned.  Last set of vitals obtained and noted.  IV was removed and pt disconnected from monitor.  Pt and visitor thanked the staff and were assisted to lobby to complete the discharge process.

## 2024-03-11 NOTE — ED Provider Notes (Signed)
 " [facility] Provider Note   CSN: [id number] Arrival date & time: 03/10/24  2243     Patient presents with: Chest Pain   Dalton Kelly is a 44 y.o. male.   The history is provided by the patient and the spouse.  Chest Pain Pain location:  Substernal area and L chest Pain radiates to:  Neck Pain severity:  Moderate Onset quality:  Sudden Duration:  8 hours Timing:  Constant Progression:  Unchanged Context: at rest   Relieved by:  Nothing Worsened by:  Nothing Associated symptoms: no cough, no diaphoresis and no vomiting   Associated symptoms comment:  Belching and indigestion  Risk factors: no coronary artery disease and no surgery   Patient is a 44 year old male with a history of migraines presents with substernal and left sided chest pain.  Patient started at approximately 3 pm while patient was at rest.  He reports that he has had pain in the past but today's episode had radiation to the neck.  Patient also reports belching and indigestion with this episode which is new.  Patient states that his husband has never witnessed him to have belched in the time they have known each other.  Patient reports has been drinking protein shakings and taking Powerade zero and liquid IV due to ongoing problems with taking solids due to dentition.      Past Medical History:  Diagnosis Date   Anxiety    Back pain    Barbiturate dependence (HCC)    Episode of syncope    Recent that sounds vasovagal   Fatigue    Generalized anxiety disorder    Hair loss    Insomnia    Irregular bowel habits    Migraines    Opioid dependence in controlled environment (HCC)    Systolic click      Prior to Admission medications  Medication Sig Start Date End Date Taking? Authorizing Provider  omeprazole  (PRILOSEC) 20 MG capsule Take 1 capsule (20 mg total) by mouth daily. 03/11/24  Yes Hewitt Garner, MD  butalbital -acetaminophen -caffeine  (FIORICET  WITH  CODEINE ) 50-325-40-30 MG capsule Take 1 capsule by mouth every 6 (six) hours as needed for headache or migraine. Do not take more than 6 tabs/d. 04/28/18   Loreli Elyn SAILOR, MD  clonazePAM  (KLONOPIN ) 0.5 MG tablet Take 1 tab po qam and midday and 2 tabs po qhs 04/26/18   Loreli Elyn SAILOR, MD  cyclobenzaprine  (FLEXERIL ) 5 MG tablet Take 10 mg by mouth 3 (three) times daily as needed. 10/25/15   Shari Sieving, MD  levothyroxine  (SYNTHROID , LEVOTHROID) 50 MCG tablet Take 1 tablet (50 mcg total) by mouth daily before breakfast. 04/28/18   Loreli Elyn SAILOR, MD  ondansetron  (ZOFRAN -ODT) 8 MG disintegrating tablet Take 8 mg by mouth every 8 (eight) hours as needed for nausea. 05/03/20   [provider]  Oxycodone  HCl 10 MG TABS Take 1 tablet (10 mg total) by mouth at bedtime as needed. Patient taking differently: Take 10 mg by mouth 4 (four) times daily. 02/14/18   Loreli Elyn SAILOR, MD  traZODone  (DESYREL ) 150 MG tablet Take 0.5 tablets (75 mg total) by mouth at bedtime. 04/28/18   Loreli Elyn SAILOR, MD    Allergies: Gabapentin, Hydrocodone , Ibuprofen, Nsaids, Other, and Requip  [ropinirole  hcl]    Review of Systems  Constitutional:  Negative for diaphoresis.  Respiratory:  Negative for cough.   Cardiovascular:  Positive for chest pain.  Gastrointestinal:  Negative  for vomiting.  All other systems reviewed and are negative.   Updated Vital Signs BP (!) 139/97   Pulse 60   Temp 97.6 F (36.4 C) (Oral)   Resp 18   Ht 5' 8 (1.727 m)   Wt 84.8 kg   SpO2 100%   BMI 28.43 kg/m   Physical Exam Vitals and nursing note reviewed. Exam conducted with a chaperone present.  Constitutional:      General: He is not in acute distress.    Appearance: Normal appearance. He is well-developed. He is not diaphoretic.  HENT:     Head: Normocephalic and atraumatic.     Nose: Nose normal.  Eyes:     Conjunctiva/sclera: Conjunctivae normal.     Pupils: Pupils are equal, round, and reactive to light.  Cardiovascular:     Rate and  Rhythm: Normal rate and regular rhythm.     Pulses: Normal pulses.     Heart sounds: Normal heart sounds.  Pulmonary:     Effort: Pulmonary effort is normal.     Breath sounds: Normal breath sounds. No wheezing or rales.  Abdominal:     General: Bowel sounds are normal.     Palpations: Abdomen is soft.     Tenderness: There is no abdominal tenderness. There is no guarding or rebound.  Musculoskeletal:        General: Normal range of motion.     Cervical back: Normal range of motion and neck supple.  Skin:    General: Skin is warm and dry.     Capillary Refill: Capillary refill takes less than 2 seconds.  Neurological:     General: No focal deficit present.     Mental Status: He is alert and oriented to person, place, and time.     Deep Tendon Reflexes: Reflexes normal.  Psychiatric:        Thought Content: Thought content normal.     (all labs ordered are listed, but only abnormal results are displayed) Results for orders placed or performed during the hospital encounter of 03/10/24  Basic metabolic panel   Collection Time: 03/10/24 10:54 PM  Result Value Ref Range   Sodium 139 135 - 145 mmol/L   Potassium 3.9 3.5 - 5.1 mmol/L   Chloride 101 98 - 111 mmol/L   CO2 26 22 - 32 mmol/L   Glucose, Bld 99 70 - 99 mg/dL   BUN 13 6 - 20 mg/dL   Creatinine, Ser 9.13 0.61 - 1.24 mg/dL   Calcium 9.0 8.9 - 89.6 mg/dL   GFR, Estimated >39 >39 mL/min   Anion gap 12 5 - 15  CBC   Collection Time: 03/10/24 10:54 PM  Result Value Ref Range   WBC 6.6 4.0 - 10.5 K/uL   RBC 4.99 4.22 - 5.81 MIL/uL   Hemoglobin 15.3 13.0 - 17.0 g/dL   HCT 56.2 60.9 - 47.9 %   MCV 87.6 80.0 - 100.0 fL   MCH 30.7 26.0 - 34.0 pg   MCHC 35.0 30.0 - 36.0 g/dL   RDW 88.4 88.4 - 84.4 %   Platelets 259 150 - 400 K/uL   nRBC 0.0 0.0 - 0.2 %  Troponin T, High Sensitivity   Collection Time: 03/10/24 10:54 PM  Result Value Ref Range   Troponin T High Sensitivity <15 0 - 19 ng/L  Hepatic function panel    Collection Time: 03/11/24 12:40 AM  Result Value Ref Range   Total Protein 6.4 (L) 6.5 - 8.1 g/dL  Albumin 4.1 3.5 - 5.0 g/dL   AST 23 15 - 41 U/L   ALT 26 0 - 44 U/L   Alkaline Phosphatase 86 38 - 126 U/L   Total Bilirubin 0.3 0.0 - 1.2 mg/dL   Bilirubin, Direct <9.8 0.0 - 0.2 mg/dL   Indirect Bilirubin NOT CALCULATED 0.3 - 0.9 mg/dL  Troponin T, High Sensitivity   Collection Time: 03/11/24 12:40 AM  Result Value Ref Range   Troponin T High Sensitivity <15 0 - 19 ng/L   DG Chest 2 View Result Date: 03/10/2024 CLINICAL DATA:  Chest pain short of breath EXAM: CHEST - 2 VIEW COMPARISON:  05/09/2020 FINDINGS: The heart size and mediastinal contours are within normal limits. Both lungs are clear. The visualized skeletal structures are unremarkable. IMPRESSION: No active cardiopulmonary disease. Electronically Signed   By: Luke Bun M.D.   On: 03/10/2024 23:15     EKG: EKG Interpretation Date/Time:  Thursday March 10 2024 22:51:31 EST Ventricular Rate:  73 PR Interval:  145 QRS Duration:  91 QT Interval:  410 QTC Calculation: 452 R Axis:   12  Text Interpretation: Sinus rhythm Probable left atrial enlargement Confirmed by Nettie, Meryle Pugmire ([id number]) on 03/10/2024 11:01:00 PM  Radiology: [facility] Chest 2 View Result Date: 03/10/2024 CLINICAL DATA:  Chest pain short of breath EXAM: CHEST - 2 VIEW COMPARISON:  05/09/2020 FINDINGS: The heart size and mediastinal contours are within normal limits. Both lungs are clear. The visualized skeletal structures are unremarkable. IMPRESSION: No active cardiopulmonary disease. Electronically Signed   By: Luke Bun M.D.   On: 03/10/2024 23:15     Procedures   Medications Ordered in the ED  ondansetron  (ZOFRAN ) injection 4 mg (4 mg Intravenous Given 03/10/24 2342)  famotidine  (PEPCID ) IVPB 20 mg premix (0 mg Intravenous Stopped 03/11/24 0018)  sodium chloride  0.9 % bolus 500 mL (0 mLs Intravenous Stopped 03/11/24 0020)  alum & mag hydroxide-simeth  (MAALOX/MYLANTA) 200-200-20 MG/5ML suspension 30 mL (30 mLs Oral Given 03/10/24 2342)  ketorolac  (TORADOL ) 30 MG/ML injection 15 mg (15 mg Intravenous Given 03/10/24 2342)                                    Medical Decision Making Patient with chest pain at rest starting this evening.  Associated belching.    Amount and/or Complexity of Data Reviewed Independent Historian: spouse    Details: See above  External Data Reviewed: notes.    Details: Previous notes in epic reviewed  Labs: ordered.    Details: Normal white count 6.6, normal hemoglobin 15.3, normal platelet count.  Normal sodium 139, normal potassium 3.9, normal chloride, normal creatinine 0.86, glucose is normal 99.  Liver function tests are normal.   Radiology: ordered and independent interpretation performed.    Details: Normal chest xray ECG/medicine tests: ordered and independent interpretation performed. Decision-making details documented in ED Course.  Risk OTC drugs. Prescription drug management. Risk Details: The patient reports he is feeling much better with medication in the ED.  I have updated the patient on lab results. Both first and second troponin are normal.  EKG is normal and unchanged from previous. Patient reports he is reassured by the normal cardiac labs.  He declines prescription for Prilosec stating he will follow up with his family doctor.  I have also made a referral to Dr. Elicia of gastroenterology.  Stable for discharge with follow up.  Final diagnoses:  Precordial pain   The patient is nontoxic-appearing on exam and vital signs are within normal limits.  I have reviewed the triage vital signs and the nursing notes. Pertinent labs & imaging results that were available during my care of the patient were reviewed by me and considered in my medical decision making (see chart for details). After history, exam, and medical workup I feel the patient has been appropriately medically screened and  is safe for discharge home. Pertinent diagnoses were discussed with the patient. Patient was given return precautions.    ED Discharge Orders          Ordered    omeprazole  (PRILOSEC) 20 MG capsule  Daily        03/11/24 0123               Jaxon Mynhier, MD 03/11/24 0349  "

## 2024-03-11 NOTE — ED Notes (Signed)
 EDP requested to add on LFTs  same was ordered as a verbal and lab was contacted and confirmed.
